# Patient Record
Sex: Female | Born: 1935 | Race: White | Hispanic: No | Smoking: Never smoker
Health system: Southern US, Community
[De-identification: ages and names within clinical notes are randomized; demographics above are authoritative.]

## PROBLEM LIST (undated history)

## (undated) DIAGNOSIS — R600 Localized edema: Secondary | ICD-10-CM

## (undated) DIAGNOSIS — C801 Malignant (primary) neoplasm, unspecified: Secondary | ICD-10-CM

## (undated) DIAGNOSIS — I480 Paroxysmal atrial fibrillation: Secondary | ICD-10-CM

## (undated) DIAGNOSIS — I10 Essential (primary) hypertension: Secondary | ICD-10-CM

## (undated) DIAGNOSIS — I5032 Chronic diastolic (congestive) heart failure: Secondary | ICD-10-CM

## (undated) DIAGNOSIS — I4891 Unspecified atrial fibrillation: Secondary | ICD-10-CM

## (undated) HISTORY — DX: Paroxysmal atrial fibrillation: I48.0

## (undated) HISTORY — PX: KNEE SURGERY: SHX244

## (undated) HISTORY — DX: Localized edema: R60.0

## (undated) HISTORY — DX: Unspecified atrial fibrillation: I48.91

## (undated) HISTORY — DX: Chronic diastolic (congestive) heart failure: I50.32

## (undated) HISTORY — PX: HIP SURGERY: SHX245

## (undated) HISTORY — PX: LUNG REMOVAL, PARTIAL: SHX233

---

## 1999-06-02 ENCOUNTER — Other Ambulatory Visit: Admission: RE | Admit: 1999-06-02 | Discharge: 1999-06-02 | Payer: Self-pay | Admitting: Internal Medicine

## 1999-06-02 ENCOUNTER — Encounter: Payer: Self-pay | Admitting: Internal Medicine

## 1999-06-02 ENCOUNTER — Encounter: Admission: RE | Admit: 1999-06-02 | Discharge: 1999-06-02 | Payer: Self-pay | Admitting: Internal Medicine

## 2000-06-21 ENCOUNTER — Encounter: Admission: RE | Admit: 2000-06-21 | Discharge: 2000-06-21 | Payer: Self-pay | Admitting: Internal Medicine

## 2000-06-21 ENCOUNTER — Encounter: Payer: Self-pay | Admitting: Internal Medicine

## 2000-07-07 ENCOUNTER — Encounter: Payer: Self-pay | Admitting: Internal Medicine

## 2000-07-07 ENCOUNTER — Encounter: Admission: RE | Admit: 2000-07-07 | Discharge: 2000-07-07 | Payer: Self-pay | Admitting: Internal Medicine

## 2001-07-12 ENCOUNTER — Encounter: Admission: RE | Admit: 2001-07-12 | Discharge: 2001-07-12 | Payer: Self-pay | Admitting: Internal Medicine

## 2001-07-12 ENCOUNTER — Encounter: Payer: Self-pay | Admitting: Internal Medicine

## 2002-08-04 ENCOUNTER — Encounter: Admission: RE | Admit: 2002-08-04 | Discharge: 2002-08-04 | Payer: Self-pay | Admitting: Internal Medicine

## 2002-08-04 ENCOUNTER — Other Ambulatory Visit: Admission: RE | Admit: 2002-08-04 | Discharge: 2002-08-04 | Payer: Self-pay | Admitting: Internal Medicine

## 2002-08-04 ENCOUNTER — Encounter: Payer: Self-pay | Admitting: Internal Medicine

## 2003-08-13 ENCOUNTER — Encounter: Admission: RE | Admit: 2003-08-13 | Discharge: 2003-08-13 | Payer: Self-pay | Admitting: Internal Medicine

## 2004-02-24 DIAGNOSIS — C3490 Malignant neoplasm of unspecified part of unspecified bronchus or lung: Secondary | ICD-10-CM

## 2004-02-24 HISTORY — DX: Malignant neoplasm of unspecified part of unspecified bronchus or lung: C34.90

## 2004-08-14 ENCOUNTER — Encounter: Admission: RE | Admit: 2004-08-14 | Discharge: 2004-08-14 | Payer: Self-pay | Admitting: Internal Medicine

## 2004-11-25 ENCOUNTER — Encounter: Admission: RE | Admit: 2004-11-25 | Discharge: 2004-11-25 | Payer: Self-pay | Admitting: Internal Medicine

## 2004-11-28 ENCOUNTER — Ambulatory Visit (HOSPITAL_COMMUNITY): Admission: RE | Admit: 2004-11-28 | Discharge: 2004-11-28 | Payer: Self-pay | Admitting: Thoracic Surgery

## 2004-12-11 ENCOUNTER — Encounter (INDEPENDENT_AMBULATORY_CARE_PROVIDER_SITE_OTHER): Payer: Self-pay | Admitting: Specialist

## 2004-12-11 ENCOUNTER — Inpatient Hospital Stay (HOSPITAL_COMMUNITY): Admission: RE | Admit: 2004-12-11 | Discharge: 2004-12-17 | Payer: Self-pay | Admitting: Thoracic Surgery

## 2004-12-15 ENCOUNTER — Ambulatory Visit: Payer: Self-pay | Admitting: Internal Medicine

## 2004-12-16 ENCOUNTER — Ambulatory Visit: Payer: Self-pay | Admitting: Internal Medicine

## 2004-12-22 ENCOUNTER — Inpatient Hospital Stay (HOSPITAL_COMMUNITY): Admission: EM | Admit: 2004-12-22 | Discharge: 2004-12-28 | Payer: Self-pay | Admitting: Emergency Medicine

## 2005-01-07 ENCOUNTER — Encounter: Admission: RE | Admit: 2005-01-07 | Discharge: 2005-01-07 | Payer: Self-pay | Admitting: Thoracic Surgery

## 2005-01-13 ENCOUNTER — Ambulatory Visit: Admission: RE | Admit: 2005-01-13 | Discharge: 2005-01-13 | Payer: Self-pay | Admitting: Gynecology

## 2005-03-18 ENCOUNTER — Ambulatory Visit (HOSPITAL_COMMUNITY): Admission: RE | Admit: 2005-03-18 | Discharge: 2005-03-18 | Payer: Self-pay | Admitting: Gynecology

## 2005-03-18 ENCOUNTER — Encounter: Admission: RE | Admit: 2005-03-18 | Discharge: 2005-03-18 | Payer: Self-pay | Admitting: Thoracic Surgery

## 2005-03-24 ENCOUNTER — Ambulatory Visit: Admission: RE | Admit: 2005-03-24 | Discharge: 2005-03-24 | Payer: Self-pay | Admitting: Gynecology

## 2005-03-31 ENCOUNTER — Ambulatory Visit (HOSPITAL_COMMUNITY): Admission: RE | Admit: 2005-03-31 | Discharge: 2005-03-31 | Payer: Self-pay | Admitting: Gynecology

## 2005-04-06 ENCOUNTER — Ambulatory Visit (HOSPITAL_COMMUNITY): Admission: RE | Admit: 2005-04-06 | Discharge: 2005-04-06 | Payer: Self-pay | Admitting: Internal Medicine

## 2005-04-08 ENCOUNTER — Ambulatory Visit: Payer: Self-pay | Admitting: Internal Medicine

## 2005-04-21 ENCOUNTER — Inpatient Hospital Stay (HOSPITAL_COMMUNITY): Admission: RE | Admit: 2005-04-21 | Discharge: 2005-04-26 | Payer: Self-pay | Admitting: Gynecology

## 2005-04-21 ENCOUNTER — Encounter (INDEPENDENT_AMBULATORY_CARE_PROVIDER_SITE_OTHER): Payer: Self-pay | Admitting: Specialist

## 2005-06-17 ENCOUNTER — Encounter: Admission: RE | Admit: 2005-06-17 | Discharge: 2005-06-17 | Payer: Self-pay | Admitting: Thoracic Surgery

## 2005-07-07 ENCOUNTER — Ambulatory Visit (HOSPITAL_COMMUNITY): Admission: RE | Admit: 2005-07-07 | Discharge: 2005-07-07 | Payer: Self-pay | Admitting: Internal Medicine

## 2005-07-07 ENCOUNTER — Ambulatory Visit: Payer: Self-pay | Admitting: Internal Medicine

## 2005-08-18 ENCOUNTER — Encounter: Admission: RE | Admit: 2005-08-18 | Discharge: 2005-08-18 | Payer: Self-pay | Admitting: Internal Medicine

## 2005-10-21 ENCOUNTER — Encounter: Admission: RE | Admit: 2005-10-21 | Discharge: 2005-10-21 | Payer: Self-pay | Admitting: Thoracic Surgery

## 2005-11-05 ENCOUNTER — Ambulatory Visit: Payer: Self-pay | Admitting: Internal Medicine

## 2005-11-09 LAB — CBC WITH DIFFERENTIAL/PLATELET
BASO%: 0.4 % (ref 0.0–2.0)
Basophils Absolute: 0 10*3/uL (ref 0.0–0.1)
Eosinophils Absolute: 0.2 10*3/uL (ref 0.0–0.5)
HCT: 37.8 % (ref 34.8–46.6)
HGB: 12.8 g/dL (ref 11.6–15.9)
MCHC: 33.9 g/dL (ref 32.0–36.0)
MONO#: 0.6 10*3/uL (ref 0.1–0.9)
NEUT#: 4.5 10*3/uL (ref 1.5–6.5)
NEUT%: 58.3 % (ref 39.6–76.8)
WBC: 7.8 10*3/uL (ref 3.9–10.0)
lymph#: 2.4 10*3/uL (ref 0.9–3.3)

## 2005-11-09 LAB — COMPREHENSIVE METABOLIC PANEL
ALT: 17 U/L (ref 0–40)
CO2: 25 mEq/L (ref 19–32)
Calcium: 9.4 mg/dL (ref 8.4–10.5)
Chloride: 105 mEq/L (ref 96–112)
Creatinine, Ser: 1.36 mg/dL — ABNORMAL HIGH (ref 0.40–1.20)

## 2005-11-10 ENCOUNTER — Ambulatory Visit (HOSPITAL_COMMUNITY): Admission: RE | Admit: 2005-11-10 | Discharge: 2005-11-10 | Payer: Self-pay | Admitting: Internal Medicine

## 2006-01-20 ENCOUNTER — Inpatient Hospital Stay (HOSPITAL_COMMUNITY): Admission: RE | Admit: 2006-01-20 | Discharge: 2006-01-23 | Payer: Self-pay | Admitting: Orthopedic Surgery

## 2006-04-21 ENCOUNTER — Ambulatory Visit: Payer: Self-pay | Admitting: Thoracic Surgery

## 2006-04-21 ENCOUNTER — Encounter: Admission: RE | Admit: 2006-04-21 | Discharge: 2006-04-21 | Payer: Self-pay | Admitting: Thoracic Surgery

## 2006-05-05 ENCOUNTER — Ambulatory Visit: Payer: Self-pay | Admitting: Internal Medicine

## 2006-05-10 LAB — CBC WITH DIFFERENTIAL/PLATELET
BASO%: 0.5 % (ref 0.0–2.0)
HCT: 36.5 % (ref 34.8–46.6)
MCHC: 33.5 g/dL (ref 32.0–36.0)
MONO#: 0.8 10*3/uL (ref 0.1–0.9)
NEUT%: 57.9 % (ref 39.6–76.8)
RBC: 4.41 10*6/uL (ref 3.70–5.32)
WBC: 8.5 10*3/uL (ref 3.9–10.0)
lymph#: 2.4 10*3/uL (ref 0.9–3.3)

## 2006-05-10 LAB — COMPREHENSIVE METABOLIC PANEL
ALT: 17 U/L (ref 0–35)
Albumin: 4.1 g/dL (ref 3.5–5.2)
CO2: 26 mEq/L (ref 19–32)
Calcium: 9.3 mg/dL (ref 8.4–10.5)
Chloride: 102 mEq/L (ref 96–112)
Sodium: 137 mEq/L (ref 135–145)
Total Protein: 7 g/dL (ref 6.0–8.3)

## 2006-05-11 ENCOUNTER — Ambulatory Visit (HOSPITAL_COMMUNITY): Admission: RE | Admit: 2006-05-11 | Discharge: 2006-05-11 | Payer: Self-pay | Admitting: Internal Medicine

## 2006-08-24 ENCOUNTER — Encounter: Admission: RE | Admit: 2006-08-24 | Discharge: 2006-08-24 | Payer: Self-pay | Admitting: Geriatric Medicine

## 2006-10-06 ENCOUNTER — Encounter: Admission: RE | Admit: 2006-10-06 | Discharge: 2006-10-06 | Payer: Self-pay | Admitting: Thoracic Surgery

## 2006-10-06 ENCOUNTER — Ambulatory Visit: Payer: Self-pay | Admitting: Thoracic Surgery

## 2006-11-04 ENCOUNTER — Ambulatory Visit: Payer: Self-pay | Admitting: Internal Medicine

## 2006-11-08 LAB — COMPREHENSIVE METABOLIC PANEL
ALT: 18 U/L (ref 0–35)
AST: 18 U/L (ref 0–37)
CO2: 25 mEq/L (ref 19–32)
Chloride: 105 mEq/L (ref 96–112)
Sodium: 140 mEq/L (ref 135–145)
Total Bilirubin: 0.5 mg/dL (ref 0.3–1.2)
Total Protein: 6.8 g/dL (ref 6.0–8.3)

## 2006-11-08 LAB — CBC WITH DIFFERENTIAL/PLATELET
BASO%: 0.5 % (ref 0.0–2.0)
MCHC: 34.4 g/dL (ref 32.0–36.0)
MONO#: 0.6 10*3/uL (ref 0.1–0.9)
RBC: 4.2 10*6/uL (ref 3.70–5.32)
WBC: 5.9 10*3/uL (ref 3.9–10.0)
lymph#: 2 10*3/uL (ref 0.9–3.3)

## 2006-11-09 ENCOUNTER — Ambulatory Visit (HOSPITAL_COMMUNITY): Admission: RE | Admit: 2006-11-09 | Discharge: 2006-11-09 | Payer: Self-pay | Admitting: Internal Medicine

## 2007-08-31 ENCOUNTER — Encounter: Admission: RE | Admit: 2007-08-31 | Discharge: 2007-08-31 | Payer: Self-pay | Admitting: Geriatric Medicine

## 2007-11-03 ENCOUNTER — Ambulatory Visit: Payer: Self-pay | Admitting: Internal Medicine

## 2007-11-07 LAB — CBC WITH DIFFERENTIAL/PLATELET
Basophils Absolute: 0 10*3/uL (ref 0.0–0.1)
HCT: 40.4 % (ref 34.8–46.6)
HGB: 13.6 g/dL (ref 11.6–15.9)
LYMPH%: 37.6 % (ref 14.0–48.0)
MCH: 30 pg (ref 26.0–34.0)
MONO#: 0.4 10*3/uL (ref 0.1–0.9)
NEUT%: 50.2 % (ref 39.6–76.8)
Platelets: 225 10*3/uL (ref 145–400)
WBC: 5.2 10*3/uL (ref 3.9–10.0)
lymph#: 2 10*3/uL (ref 0.9–3.3)

## 2007-11-07 LAB — COMPREHENSIVE METABOLIC PANEL
BUN: 26 mg/dL — ABNORMAL HIGH (ref 6–23)
CO2: 26 mEq/L (ref 19–32)
Calcium: 9.6 mg/dL (ref 8.4–10.5)
Chloride: 104 mEq/L (ref 96–112)
Creatinine, Ser: 1.08 mg/dL (ref 0.40–1.20)

## 2007-11-08 ENCOUNTER — Ambulatory Visit (HOSPITAL_COMMUNITY): Admission: RE | Admit: 2007-11-08 | Discharge: 2007-11-08 | Payer: Self-pay | Admitting: Internal Medicine

## 2008-10-15 ENCOUNTER — Encounter: Admission: RE | Admit: 2008-10-15 | Discharge: 2008-10-15 | Payer: Self-pay | Admitting: Geriatric Medicine

## 2008-11-01 ENCOUNTER — Ambulatory Visit: Payer: Self-pay | Admitting: Internal Medicine

## 2008-11-05 ENCOUNTER — Ambulatory Visit (HOSPITAL_COMMUNITY): Admission: RE | Admit: 2008-11-05 | Discharge: 2008-11-05 | Payer: Self-pay | Admitting: Internal Medicine

## 2008-11-05 LAB — COMPREHENSIVE METABOLIC PANEL
AST: 21 U/L (ref 0–37)
BUN: 26 mg/dL — ABNORMAL HIGH (ref 6–23)
Calcium: 9.2 mg/dL (ref 8.4–10.5)
Chloride: 104 mEq/L (ref 96–112)
Creatinine, Ser: 0.98 mg/dL (ref 0.40–1.20)
Total Bilirubin: 0.5 mg/dL (ref 0.3–1.2)

## 2008-11-05 LAB — CBC WITH DIFFERENTIAL/PLATELET
BASO%: 0.5 % (ref 0.0–2.0)
Basophils Absolute: 0 10*3/uL (ref 0.0–0.1)
EOS%: 2.4 % (ref 0.0–7.0)
HCT: 39.7 % (ref 34.8–46.6)
HGB: 13.4 g/dL (ref 11.6–15.9)
LYMPH%: 30 % (ref 14.0–49.7)
MCH: 30.8 pg (ref 25.1–34.0)
MCHC: 33.7 g/dL (ref 31.5–36.0)
MCV: 91.3 fL (ref 79.5–101.0)
NEUT%: 60.6 % (ref 38.4–76.8)
Platelets: 239 10*3/uL (ref 145–400)
lymph#: 1.9 10*3/uL (ref 0.9–3.3)

## 2009-06-26 ENCOUNTER — Inpatient Hospital Stay (HOSPITAL_COMMUNITY): Admission: RE | Admit: 2009-06-26 | Discharge: 2009-06-29 | Payer: Self-pay | Admitting: Orthopedic Surgery

## 2009-10-30 ENCOUNTER — Encounter: Admission: RE | Admit: 2009-10-30 | Discharge: 2009-10-30 | Payer: Self-pay | Admitting: Geriatric Medicine

## 2009-11-06 ENCOUNTER — Ambulatory Visit (HOSPITAL_BASED_OUTPATIENT_CLINIC_OR_DEPARTMENT_OTHER): Payer: 59 | Admitting: Internal Medicine

## 2009-11-07 ENCOUNTER — Ambulatory Visit (HOSPITAL_COMMUNITY)
Admission: RE | Admit: 2009-11-07 | Discharge: 2009-11-07 | Payer: Self-pay | Source: Home / Self Care | Admitting: Internal Medicine

## 2009-11-07 LAB — CBC WITH DIFFERENTIAL/PLATELET
BASO%: 0.7 % (ref 0.0–2.0)
EOS%: 3.3 % (ref 0.0–7.0)
Eosinophils Absolute: 0.2 10*3/uL (ref 0.0–0.5)
HGB: 12 g/dL (ref 11.6–15.9)
MCH: 26.3 pg (ref 25.1–34.0)
MCHC: 32.3 g/dL (ref 31.5–36.0)
NEUT%: 54.8 % (ref 38.4–76.8)
Platelets: 257 10*3/uL (ref 145–400)
RBC: 4.57 10*6/uL (ref 3.70–5.45)

## 2009-11-07 LAB — COMPREHENSIVE METABOLIC PANEL
ALT: 11 U/L (ref 0–35)
AST: 19 U/L (ref 0–37)
Alkaline Phosphatase: 65 U/L (ref 39–117)
BUN: 29 mg/dL — ABNORMAL HIGH (ref 6–23)
CO2: 27 mEq/L (ref 19–32)
Chloride: 103 mEq/L (ref 96–112)
Creatinine, Ser: 1.13 mg/dL (ref 0.40–1.20)
Sodium: 140 mEq/L (ref 135–145)
Total Bilirubin: 0.4 mg/dL (ref 0.3–1.2)

## 2010-03-15 ENCOUNTER — Other Ambulatory Visit: Payer: Self-pay | Admitting: Internal Medicine

## 2010-03-15 ENCOUNTER — Encounter: Payer: Self-pay | Admitting: Thoracic Surgery

## 2010-03-15 ENCOUNTER — Encounter: Payer: Self-pay | Admitting: Internal Medicine

## 2010-03-15 DIAGNOSIS — C3490 Malignant neoplasm of unspecified part of unspecified bronchus or lung: Secondary | ICD-10-CM

## 2010-03-16 ENCOUNTER — Encounter: Payer: Self-pay | Admitting: Orthopedic Surgery

## 2010-03-26 ENCOUNTER — Other Ambulatory Visit: Payer: Self-pay | Admitting: Family Medicine

## 2010-04-27 ENCOUNTER — Inpatient Hospital Stay (HOSPITAL_COMMUNITY)
Admission: EM | Admit: 2010-04-27 | Discharge: 2010-05-01 | DRG: 176 | Disposition: A | Discharge status: Home / Self Care | Payer: Medicare Other | Attending: Family Medicine | Admitting: Family Medicine

## 2010-04-27 ENCOUNTER — Emergency Department (HOSPITAL_COMMUNITY): Payer: Medicare Other

## 2010-04-27 DIAGNOSIS — I2699 Other pulmonary embolism without acute cor pulmonale: Principal | ICD-10-CM | POA: Diagnosis present

## 2010-04-27 DIAGNOSIS — G252 Other specified forms of tremor: Secondary | ICD-10-CM | POA: Diagnosis present

## 2010-04-27 DIAGNOSIS — I129 Hypertensive chronic kidney disease with stage 1 through stage 4 chronic kidney disease, or unspecified chronic kidney disease: Secondary | ICD-10-CM | POA: Diagnosis present

## 2010-04-27 DIAGNOSIS — G250 Essential tremor: Secondary | ICD-10-CM | POA: Diagnosis present

## 2010-04-27 DIAGNOSIS — K219 Gastro-esophageal reflux disease without esophagitis: Secondary | ICD-10-CM | POA: Diagnosis present

## 2010-04-27 DIAGNOSIS — R0902 Hypoxemia: Secondary | ICD-10-CM | POA: Diagnosis present

## 2010-04-27 DIAGNOSIS — Z96649 Presence of unspecified artificial hip joint: Secondary | ICD-10-CM

## 2010-04-27 DIAGNOSIS — M1990 Unspecified osteoarthritis, unspecified site: Secondary | ICD-10-CM | POA: Diagnosis present

## 2010-04-27 DIAGNOSIS — N181 Chronic kidney disease, stage 1: Secondary | ICD-10-CM | POA: Diagnosis present

## 2010-04-27 DIAGNOSIS — Z7982 Long term (current) use of aspirin: Secondary | ICD-10-CM

## 2010-04-27 DIAGNOSIS — Z85118 Personal history of other malignant neoplasm of bronchus and lung: Secondary | ICD-10-CM

## 2010-04-27 LAB — POCT CARDIAC MARKERS
CKMB, poc: <1 ng/mL — ABNORMAL LOW (ref 1.0–8.0)
Myoglobin, poc: 58.9 ng/mL (ref 12–200)

## 2010-04-27 LAB — BASIC METABOLIC PANEL
BUN: 19 mg/dL (ref 6–23)
CO2: 27 mEq/L (ref 19–32)
Calcium: 9.4 mg/dL (ref 8.4–10.5)
GFR calc Af Amer: >60 mL/min (ref >60)
Sodium: 138 mEq/L (ref 135–145)

## 2010-04-27 LAB — DIFFERENTIAL
Eosinophils Absolute: 0.2 10*3/uL (ref 0.0–0.7)
Eosinophils Relative: 3 % (ref 0–5)
Monocytes Absolute: 0.7 10*3/uL (ref 0.1–1.0)
Neutro Abs: 4.6 10*3/uL (ref 1.7–7.7)

## 2010-04-27 LAB — BRAIN NATRIURETIC PEPTIDE: Pro B Natriuretic peptide (BNP): 179 pg/mL — ABNORMAL HIGH (ref 0.0–100.0)

## 2010-04-27 LAB — CBC
Hemoglobin: 12.4 g/dL (ref 12.0–15.0)
MCHC: 32 g/dL (ref 30.0–36.0)
Platelets: 250 10*3/uL (ref 150–400)
RDW: 16.1 % — ABNORMAL HIGH (ref 11.5–15.5)
WBC: 8.2 10*3/uL (ref 4.0–10.5)

## 2010-04-27 LAB — PROTIME-INR: Prothrombin Time: 15.1 seconds (ref 11.6–15.2)

## 2010-04-27 MED ORDER — IOHEXOL 300 MG/ML  SOLN: 100.0000 mL | Freq: Once | INTRAMUSCULAR | Status: AC | PRN

## 2010-04-28 DIAGNOSIS — I2699 Other pulmonary embolism without acute cor pulmonale: Secondary | ICD-10-CM

## 2010-04-28 LAB — COMPREHENSIVE METABOLIC PANEL
ALT: 13 U/L (ref 0–35)
Alkaline Phosphatase: 57 U/L (ref 39–117)
BUN: 20 mg/dL (ref 6–23)
CO2: 26 mEq/L (ref 19–32)
Chloride: 106 mEq/L (ref 96–112)
GFR calc non Af Amer: 56 mL/min — ABNORMAL LOW (ref >60)
Glucose, Bld: 96 mg/dL (ref 70–99)
Potassium: 4 mEq/L (ref 3.5–5.1)
Sodium: 141 mEq/L (ref 135–145)
Total Bilirubin: 0.5 mg/dL (ref 0.3–1.2)
Total Protein: 6.1 g/dL (ref 6.0–8.3)

## 2010-04-28 LAB — CBC
HCT: 37.4 % (ref 36.0–46.0)
Hemoglobin: 11.9 g/dL — ABNORMAL LOW (ref 12.0–15.0)
MCV: 81.5 fL (ref 78.0–100.0)
WBC: 7.2 10*3/uL (ref 4.0–10.5)

## 2010-04-28 LAB — HEPARIN LEVEL (UNFRACTIONATED): Heparin Unfractionated: 0.69 IU/mL (ref 0.30–0.70)

## 2010-04-28 LAB — DIFFERENTIAL
Basophils Absolute: 0 10*3/uL (ref 0.0–0.1)
Lymphocytes Relative: 47 % — ABNORMAL HIGH (ref 12–46)
Lymphs Abs: 3.4 10*3/uL (ref 0.7–4.0)
Neutro Abs: 3 10*3/uL (ref 1.7–7.7)

## 2010-04-28 LAB — PROTIME-INR
INR: 1.1 (ref 0.00–1.49)
Prothrombin Time: 14.4 seconds (ref 11.6–15.2)

## 2010-04-29 LAB — HEPARIN LEVEL (UNFRACTIONATED): Heparin Unfractionated: 0.6 IU/mL (ref 0.30–0.70)

## 2010-04-29 LAB — DIFFERENTIAL
Basophils Relative: 1 % (ref 0–1)
Eosinophils Absolute: 0.3 10*3/uL (ref 0.0–0.7)
Eosinophils Relative: 5 % (ref 0–5)
Lymphs Abs: 2.6 10*3/uL (ref 0.7–4.0)

## 2010-04-29 LAB — BASIC METABOLIC PANEL
BUN: 17 mg/dL (ref 6–23)
CO2: 23 mEq/L (ref 19–32)
Calcium: 8.5 mg/dL (ref 8.4–10.5)
Chloride: 109 mEq/L (ref 96–112)
Creatinine, Ser: 0.94 mg/dL (ref 0.4–1.2)
GFR calc non Af Amer: 58 mL/min — ABNORMAL LOW (ref >60)

## 2010-04-29 LAB — CBC
MCV: 81.9 fL (ref 78.0–100.0)
Platelets: 203 10*3/uL (ref 150–400)
RDW: 16.5 % — ABNORMAL HIGH (ref 11.5–15.5)
WBC: 6.5 10*3/uL (ref 4.0–10.5)

## 2010-04-29 LAB — PROTIME-INR: INR: 1.08 (ref 0.00–1.49)

## 2010-04-30 LAB — BASIC METABOLIC PANEL
BUN: 14 mg/dL (ref 6–23)
CO2: 28 mEq/L (ref 19–32)
Chloride: 108 mEq/L (ref 96–112)
GFR calc non Af Amer: 53 mL/min — ABNORMAL LOW (ref >60)
Glucose, Bld: 95 mg/dL (ref 70–99)
Potassium: 4.3 mEq/L (ref 3.5–5.1)
Sodium: 141 mEq/L (ref 135–145)

## 2010-04-30 LAB — CBC
HCT: 35.8 % — ABNORMAL LOW (ref 36.0–46.0)
Hemoglobin: 11.1 g/dL — ABNORMAL LOW (ref 12.0–15.0)
MCV: 82.7 fL (ref 78.0–100.0)
RBC: 4.33 MIL/uL (ref 3.87–5.11)
RDW: 16.6 % — ABNORMAL HIGH (ref 11.5–15.5)
WBC: 5.6 10*3/uL (ref 4.0–10.5)

## 2010-04-30 LAB — DIFFERENTIAL
Basophils Absolute: 0 10*3/uL (ref 0.0–0.1)
Eosinophils Relative: 5 % (ref 0–5)
Lymphocytes Relative: 45 % (ref 12–46)
Lymphs Abs: 2.5 10*3/uL (ref 0.7–4.0)
Neutro Abs: 2.3 10*3/uL (ref 1.7–7.7)

## 2010-05-01 LAB — DIFFERENTIAL
Basophils Relative: 1 % (ref 0–1)
Lymphocytes Relative: 51 % — ABNORMAL HIGH (ref 12–46)
Lymphs Abs: 3 10*3/uL (ref 0.7–4.0)
Monocytes Absolute: 0.5 10*3/uL (ref 0.1–1.0)
Monocytes Relative: 9 % (ref 3–12)
Neutro Abs: 2 10*3/uL (ref 1.7–7.7)
Neutrophils Relative %: 34 % — ABNORMAL LOW (ref 43–77)

## 2010-05-01 LAB — CBC
HCT: 35.5 % — ABNORMAL LOW (ref 36.0–46.0)
Hemoglobin: 10.9 g/dL — ABNORMAL LOW (ref 12.0–15.0)
MCH: 25.2 pg — ABNORMAL LOW (ref 26.0–34.0)
MCHC: 30.7 g/dL (ref 30.0–36.0)
RBC: 4.32 MIL/uL (ref 3.87–5.11)

## 2010-05-01 LAB — PROTIME-INR
INR: 2.27 — ABNORMAL HIGH (ref 0.00–1.49)
Prothrombin Time: 25.2 seconds — ABNORMAL HIGH (ref 11.6–15.2)

## 2010-05-05 NOTE — H&P (Signed)
Alison Dunn, Alison Dunn                  ACCOUNT NO.:  [id number]  MEDICAL RECORD NO.:  [id number]           PATIENT TYPE:  E  LOCATION:  [facility]                         FACILITY:  [facility]  PHYSICIAN:  Isidor Holts, M.D.  DATE OF BIRTH:  09/27/1931  DATE OF ADMISSION:  04/27/2010 DATE OF DISCHARGE:                             HISTORY & PHYSICAL   PRIMARY CARE PHYSICIAN:  Hal T. Stoneking, MD  PRIMARY ORTHOPEDIC SURGEON:  Ollen Gross, MD  PRIMARY CARDIOTHORACIC SURGEON:  Dr. Edwyna Shell.  CHIEF COMPLAINT:  Shortness of breath on exertion for 2 weeks, worse in the last 1 week.  HISTORY OF PRESENT ILLNESS:  This is a 77 year old female, an excellent historian.  According to the patient, she became gradually short of breath on exertion over the last 2 weeks and this has become progressively worse in the last 1 week, with the patient finding it increasingly difficult to look after her husband, who requires a lot of assistance with his valvular heart problems and ambulating with a cane after a hip fracture a little over 1 year ago.  On April 24, 2010, she got really concerned and went to see her primary MD, who did a chest x- ray, which turned out to be negative.  He did, however, feel that she may have had some "fluid overload."  He stopped her hydrochlorothiazide and placed her on Lasix, 20 mg p.o. daily.  Although she did utilize this for the past 2 days, the shortness of breath got much worse, so much so today, that her granddaughter brought her to the emergency department.  The patient denies chest pain.  Denies cough or lower extremity swelling. Denies any history of long distance travel, although she does admit that she has done a lot of sitting down, lately.  PAST MEDICAL HISTORY.: 1. Right hip of osteoarthritis status post total hip replacement, Jun 26, 2009. 2. Status post left knee arthroplasty, November 2007. 3. History of benign tremor. 4. History of migraines. 5.  Intermittent vertigo. 6. Remote history of shingles. 7. Hypertension. 8. GERD/large hiatal hernia. 9. History of tubo-ovarian mass/sigmoid diverticulitis status post     salpingo-oophorectomy and sigmoid colectomy February 2007 with     primary anastomosis 10.History of stage I lung cancer status post VATS left upper     lobectomy October 2006. 11.Postoperative pulmonary embolism after lung surgery, was     anticoagulated for 6 months.  ALLERGIES: 1. PERCOCET cause hallucinations. 2. TYLOX cause hallucinations. 3. ROXICET cause hallucinations. 4. CODEINE, this causes nausea.  MEDICATION HISTORY: 1. Aciphex 20 mg p.o. daily. 2. Aspirin 81 mg p.o. daily. 3. Calcium carbonate OTC 1 tablet p.o. daily. 4. Furosemide 20 mg p.o. daily commenced on April 24, 2010 discontinued     on April 26, 2010. 5. Hydrochlorothiazide 25 mg p.o. daily discontinued on April 24, 2010. 6. Inderal LA 80 mg p.o. daily. 7. Metamucil 1 tablespoon p.o. daily. 8. Multivitamins therapeutic one p.o. daily. 9. Tylenol extra strength 500 mg p.o. q.i.d. p.r.n. 10.__________ 2 mg p.o. daily p.r.n. 11.Vitamin B12 OTC 1000 mcg sublingually daily. 12.Vitamin C 500  mg p.o. daily. 13.Vitamin D3 1000 units p.o. daily. 14.Vitamin E 400 units p.o. daily.  REVIEW OF SYSTEMS:  As per HPI and chief complaint.  The patient denies abdominal pain, vomiting, or diarrhea.  She moves her bowels regularly with the use of Metamucil.  Denies any lower extremity swelling, tenseness, or pain.  Denies fever or chills.  Rest of systems review is negative.  SOCIAL HISTORY:  The patient is married for about 57 years now.  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  Nonsmoker, nondrinker.  Has no history of drug abuse. She has 2 offspring, a boy and a girl.  FAMILY HISTORY:  The patient's mother died of an MI at age 55 years. She was status post CVA.  The patient's father died status post  MI.  The patient is not sure as to how old he was, at the time.  PHYSICAL EXAMINATION:  VITAL SIGNS:  Temperature 98.7, pulse 90 per minute regular, respiratory rate 20, BP 157/100 mmHg.  Rechecked 144/85 mmHg, pulse oximeter 100% and 2 L of oxygen.  The patient was obviously short of breath on minimal exertion during the course of this evaluation although she was able to talk in complete sentences, alert, communicative. HEENT:  No clinical pallor or jaundice.  No conjunctival injection. Throat: Visible mucous membranes appear dry.  Neck: Supple JVP not seen. No palpable lymphadenopathy.  No palpable goiter. CHEST:  Clinically clear to auscultation.  No wheezes, no crackles. HEART:  Sounds 1 and 2 heard normal, occasionally ectopics, no murmurs. ABDOMEN:  Morbidly obese, soft, nontender.  Lower midline laparotomy scar is noted.  Unable to palpate organs.  Bowel sounds appear normal. EXTREMITIES:  Lower extremity examination, no pitting edema.  The patient's  appears somewhat prominent however and not a "tense" certainly nontender.  Superficial veins appear prominent.  Peripheral pulses are palpable. MUSCULOSKELETAL:  The patient has generalized osteoarthritic changes. CENTRAL NERVOUS SYSTEM:  The patient has an intention tremor was well as titubation.  Otherwise, no focal neurologic deficits on gross examination.  INVESTIGATIONS:  CBC; WBC 8.2, hemoglobin 12.4, hematocrit 38.8, platelets 250.  Electrolytes, sodium 138, potassium 4.3, chloride 103, CO2 of 27, BUN 19, creatinine 0.96, glucose 101.  BNP 179, calcium 9.4. Troponin I point-of-care less than 0.05.  A 12-lead EKG done April 27, 2010 showed sinus rhythm regular, occasional ventricular ectopics, normal axis, partial right bundle-branch block pattern, no acute ischemic changes.  This does not significantly differ from 12-lead EKG of June 21, 2009, although the previous EKG had no partial right bundle- branch block pattern.   Chest x-ray April 27, 2010 showed no acute findings.  Chest CT angiogram April 27, 2010 showed right upper lobe, right middle lobe, and right lower lobe acute pulmonary emboli.  ASSESSMENT AND PLAN: 1. Multiple pulmonary emboli.  We shall admit the patient for     telemetric monitoring, institute anticoagulation with intravenous     heparin infusion and concomitant oral Coumadin.  Arrange bilateral     lower extremity venous Dopplers, to rule out deep vein thrombosis.     The patient will likely need lifelong anticoagulation.  2. Gastroesophageal reflux disease.  We shall manage proton pump     inhibitor.  The patient has a documented large hiatal hernia on chest     CT angiogram.  3. Hypertension.  The patient's blood pressure was mildly elevated at     this time.  We shall continue preadmission  beta-blocker, monitor     and adjust medications, if indicated.    Further management will depend on clinical course.     Isidor Holts, M.D.     CO/MEDQ  D:  04/27/2010  T:  04/27/2010  Job:  [id number]  cc:   Hal T. Stoneking, M.D.  Electronically Signed by Isidor Holts M.D. on 05/05/2010 11:21:13 AM

## 2010-05-08 LAB — POCT I-STAT, CHEM 8
BUN: 29 mg/dL — ABNORMAL HIGH (ref 6–23)
Chloride: 106 mEq/L (ref 96–112)
Glucose, Bld: 107 mg/dL — ABNORMAL HIGH (ref 70–99)
HCT: 41 % (ref 36.0–46.0)
Potassium: 4.3 mEq/L (ref 3.5–5.1)

## 2010-05-08 NOTE — Discharge Summary (Signed)
NAMEMINDI, AKERSON                  ACCOUNT NO.:  [id number]  MEDICAL RECORD NO.:  [id number]           PATIENT TYPE:  I  LOCATION:  3712                         FACILITY:  [facility]  PHYSICIAN:  Pleas Koch, MD        DATE OF BIRTH:  [birth year]  DATE OF ADMISSION:  04/27/2010 DATE OF DISCHARGE:  05/01/2010                              DISCHARGE SUMMARY   PRIMARY CARE PHYSICIAN:  Hal T. Stoneking, MD  ORTHOPEDIC SURGEON:  Ollen Gross, MD  CARDIOTHORACIC SURGEON:  Ines Bloomer, MD  DISCHARGE DIAGNOSIS: 1. Bilateral pulmonary embolism. 2. Hypertension. 3. Gastroesophageal reflux. 4. 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 5. Osteoarthritis. 6. Hypoxemia. 7. Benign tremor.  DISCHARGE MEDICATIONS: 1. Tylenol 500 p.o. q.i.d. p.r.n. 2. Warfarin. 3. Valium 2 mg 1 tablet daily p.r.n. 4. Inderal LA 80 mg 1 tab daily. 5. Metamucil 1 tablespoon daily. 6. Lasix 20 mg daily. 7. Multivitamins 1 tablet daily. 8. Aspirin 81 mg daily. 9. Aciphex 20 mg 1 tablet daily. 10.Calcium carbonate 1 tablet daily. 11.Vitamin B12 over the counter 1 tablet daily. 12.Vitamin C 500 mg 1 daily. 13.Vitamin D3 1000 units one daily. 14.Vitamin E 400 units p.o. daily.  NEW MEDICATIONS: 1. Hydrochlorothiazide 12.5 daily. 2. Amlodipine 5 mg daily. 3. Coumadin 7.5 mg daily.  RECOMMENDATIONS:  The patient needs to have the need for aspirin reassessed, as the patient is now on Coumadin, also calcium carbonate usually at t.i.d. dose.  PERTINENT RADIOLOGICAL FINDINGS: 1. Chest x-ray portable March 29, 2010, showed no acute findings.     CT angiogram of chest March 29, 2010, showed positive studies for     bilateral pulmonary emboli.  No right heart strain. 2. Left upper lobectomy. 3. Large hiatal hernia. 4. EKG done March 29, 2010, showed multiform PVCs, left atrial     abnormality.  Unchanged from prior.  BRIEF HOSPITAL COURSE:  The patient is a pleasant 77 year old female, who became  gradually short of breath on the day prior to admission over the prior 2 weeks.  This has been worsening and the patient is finding it difficult to look after her husband, who requires lot of assistance with valvular heart problems and ambulating after a hip fracture a year ago.  She went to her primary MD, who did chest x-ray on April 24, 2010, and he felt that she had some fluid overload.  He stopped fluid hydrochlorothiazide and placed her on Lasix.  She states the shortness breath despite that got so much worse that she went to the emergency room.  PHYSICAL EXAMINATION:  VITAL SIGNS:  On admission, temperature 98.7, pulse 90, and blood pressure 157/100. LUNGS:  The patient was relatively short of breath on minimal exertion during evaluation, although was able to talk complete sentences. HEART:  Occasional ectopics.  JVP not seen.  No pitting edema.  PERTINENT ADMISSION LABORATORY DATA:  WBC 8.2, hemoglobin 12.4, hematocrit 38.8, and platelets 250.  Electrolytes; sodium 138, potassium 4.3, chloride 103, CO2 of 27, BUN 19, creatinine 0.96, and glucose 101. BNP 179.  EKG was as above.  HOSPITAL COURSE: 1. Bilateral  PEs.  The patient was initially placed on heparin and     this was bridged over to Lovenox on April 30, 2010.  She was placed     on Coumadin per pharmacy protocol; however, I increased this to 10     mg every day.  She became therapeutic on May 01, 2010, and hence     further bridging was not necessary.  I have discharged her home on     7.5 mg of Coumadin, and she states she will follow up with Dr.     Pete Glatter and make an appointment to see him on Monday for repeat     INR.  The patient is conversant with the use of Coumadin and in     fact her husband is on the same medication.  She has been counseled     extensively regarding low vitamin K diet. 2. CKD I to II.  Repeat BMP will be needed at Dr. Laverle Hobby office,     as her creatinine did bump to 1.06 and we had to  lower her dose of     hydrochlorothiazide 25/12.5. 3. Hypertension.  She was placed on propranolol as home dose and kept     on her hydrochlorothiazide 12.5 and amlodipine 5 mg daily on     discharge.  She will likely need to be on this. 4. Osteoarthritis is stable. 5. Hypoxemia.  The patient had relatively low sats initially in the     hospital and this was attributable to her probably having pulmonary     emboli.  She was ambulated subsequently in the halls and her O2     sats were 97%.  As such, she does not meet criteria for needing     oxygen at this time.  It is notable that she has had an upper lobe     lobectomy in 2006 for stage I lung cancer and this may be a     complicating issue.  CONDITION ON DISCHARGE:  The patient was discharged home in stable state.  DISCHARGE PHYSICAL EXAMINATION:  VITAL SIGNS:  On the day of discharge, 97.9, pulse rate 70, respirations 18, and blood pressure 136-145/84-85.  DISCHARGE LABORATORY DATA:  Labs showed an INR of 2.27 and her hemoglobin and hematocrit was 10.9/35.5.  This may warrant an outpatient evaluation for anemia.  I spent over 30 minutes in time coordinating this patient's discharge.          ______________________________ Pleas Koch, MD     JS/MEDQ  D:  05/01/2010  T:  05/01/2010  Job:  [id number]  cc:   Hal T. Stoneking, M.D. Ollen Gross, M.D. Ines Bloomer, M.D.  Electronically Signed by Pleas Koch MD on 05/08/2010 04:41:38 PM

## 2010-05-13 LAB — BASIC METABOLIC PANEL
CO2: 30 mEq/L (ref 19–32)
Calcium: 8.5 mg/dL (ref 8.4–10.5)
Chloride: 102 mEq/L (ref 96–112)
Creatinine, Ser: 0.94 mg/dL (ref 0.4–1.2)
GFR calc Af Amer: >60 mL/min (ref >60)
Glucose, Bld: 119 mg/dL — ABNORMAL HIGH (ref 70–99)
Potassium: 4.5 mEq/L (ref 3.5–5.1)
Sodium: 136 mEq/L (ref 135–145)
Sodium: 137 mEq/L (ref 135–145)

## 2010-05-13 LAB — URINALYSIS, ROUTINE W REFLEX MICROSCOPIC
Bilirubin Urine: NEGATIVE
Ketones, ur: NEGATIVE mg/dL
Nitrite: NEGATIVE
Protein, ur: NEGATIVE mg/dL
Urobilinogen, UA: 0.2 mg/dL (ref 0.0–1.0)
pH: 6.5 (ref 5.0–8.0)

## 2010-05-13 LAB — COMPREHENSIVE METABOLIC PANEL
AST: 27 U/L (ref 0–37)
BUN: 23 mg/dL (ref 6–23)
CO2: 31 mEq/L (ref 19–32)
Calcium: 8.7 mg/dL (ref 8.4–10.5)
Chloride: 105 mEq/L (ref 96–112)
Creatinine, Ser: 0.95 mg/dL (ref 0.4–1.2)
GFR calc Af Amer: >60 mL/min (ref >60)
GFR calc non Af Amer: 57 mL/min — ABNORMAL LOW (ref >60)
Total Bilirubin: 0.6 mg/dL (ref 0.3–1.2)

## 2010-05-13 LAB — CBC
HCT: 32 % — ABNORMAL LOW (ref 36.0–46.0)
HCT: 32.8 % — ABNORMAL LOW (ref 36.0–46.0)
HCT: 33.9 % — ABNORMAL LOW (ref 36.0–46.0)
HCT: 40.4 % (ref 36.0–46.0)
Hemoglobin: 10.7 g/dL — ABNORMAL LOW (ref 12.0–15.0)
Hemoglobin: 10.9 g/dL — ABNORMAL LOW (ref 12.0–15.0)
Hemoglobin: 11.2 g/dL — ABNORMAL LOW (ref 12.0–15.0)
MCHC: 33.3 g/dL (ref 30.0–36.0)
MCHC: 32.8 g/dL (ref 30.0–36.0)
MCV: 90.5 fL (ref 78.0–100.0)
MCV: 90.8 fL (ref 78.0–100.0)
MCV: 91.4 fL (ref 78.0–100.0)
Platelets: 199 10*3/uL (ref 150–400)
Platelets: 231 10*3/uL (ref 150–400)
RBC: 3.52 MIL/uL — ABNORMAL LOW (ref 3.87–5.11)
RBC: 3.73 MIL/uL — ABNORMAL LOW (ref 3.87–5.11)
RBC: 4.42 MIL/uL (ref 3.87–5.11)
RDW: 14.6 % (ref 11.5–15.5)
RDW: 15 % (ref 11.5–15.5)
WBC: 11.2 10*3/uL — ABNORMAL HIGH (ref 4.0–10.5)

## 2010-05-13 LAB — APTT: aPTT: 29 seconds (ref 24–37)

## 2010-05-13 LAB — PROTIME-INR
INR: 0.97 (ref 0.00–1.49)
Prothrombin Time: 12.8 seconds (ref 11.6–15.2)

## 2010-05-13 LAB — TYPE AND SCREEN: ABO/RH(D): O POS

## 2010-05-13 LAB — URINE MICROSCOPIC-ADD ON

## 2010-07-08 NOTE — Letter (Signed)
October 06, 2006   Lajuana Matte, MD  [phone]  [place], [place] [address]   Re:  Alison Dunn, VOLLMER                  DOB:  03/24/1931   Dear Arbutus Ped:   I saw Alison Dunn back today.  She is scheduled for a CT scan in 1 month.  Her chest x-ray was stable.  Her incisions were well healed.  It has  been approximately 2 years since we did her surgery.  I will refer her  to you for longterm followup.  If there is any question I will be happy  to see her also, but she is doing well from our standpoint.  Her blood  pressure was 150/80, pulse was 65, respirations 18, saturations were  96%.  Lungs were clear to auscultation and percussion.   I appreciate the opportunity of taking care of Alison Dunn.   Ines Bloomer, M.D.  Electronically Signed   DPB/MEDQ  D:  10/06/2006  T:  10/07/2006  Job:  [id number]   cc:   Hal T. Stoneking, M.D.

## 2010-07-11 NOTE — Op Note (Signed)
NAMEVALARIA, Alison Dunn                  ACCOUNT NO.:  [id number]   MEDICAL RECORD NO.:  [id number]          PATIENT TYPE:  INP   LOCATION:  1008                         FACILITY:  [facility]   PHYSICIAN:  Lebron Conners, M.D.   DATE OF BIRTH:  [birth year]   DATE OF PROCEDURE:  04/21/2005  DATE OF DISCHARGE:                                 OPERATIVE REPORT   PREOPERATIVE DIAGNOSIS:  Possible metastatic lung cancer.   POSTOPERATIVE DIAGNOSIS:  Diverticulitis of the sigmoid colon with  perforation and abscess.   PROCEDURE:  Sigmoid colectomy with anastomosis.   SURGEON:  Dr. Lebron Conners.   ASSISTANT:  Dr. Stanford Breed.   ANESTHESIA:  General.   BLOOD LOSS:  Minimal for my portion of the procedure.   COMPLICATIONS:  None.   CLINICAL NOTE/CONSULTATION:  Dr. Stanford Breed asked me to come in to  assist with evaluation and treatment of Ms. Baxley upon whom he was operating  because of a mass in the left pelvis which was positive on PET scan. The  patient has early favorable lung cancer but that was there was concerned  that she either had a primary ovarian neoplasm or a metastasis of lung  cancer to the pelvis. The patient has no history of diverticulitis. She has  had normal white count, no fever, no GI symptoms. Upon exploration, Dr.  Stanford Breed found that there was a complex area of induration mass  involving the left adnexa and the sigmoid colon. As he dissected the adnexa  away, he came into an abscess cavity. He proceeded with bilateral salpingo-  oophorectomy and he asked me to come in and evaluate. The patient's  preoperative white count was 8100. She had a bowel prep which she had  tolerated well.   PROCEDURE:  I scrubbed in and assessed the colon. There was an area where an  abscess had been opened. There was no peritonitis evident. There was a good  deal of induration of the mesentery of the distal sigmoid colon. Because of  the chronicity of the process and lack of  peritonitis and acute illness and  the fact that a good bowel prep had been done, I felt that a colectomy with  primary anastomosis was advisable. The patient's family agreed to have what  ever procedure was warranted done and the operative personnel spoke with  them about the performance of the procedure and they agreed to go ahead.  Dr. Stanford Breed had mobilized the sigmoid laterally and exposed the left  ureter. He also dissected along the presacral space a bit. I looked at the  bowel in the mid sigmoid and it looked healthy. There were some diverticula  present throughout the whole colon. Distally there was nice soft bowel. I  divided the bowel proximally with cutting stapler and then segmentally  divided the mesentery toward the sacral promontory clamping and ligating  vessels with 2-0 silks. I then took the mesenteric division back up to the  bowel beyond the abscess and the indurated mass and when I had thinned the  mesentery down adequately placed stay sutures on each side  of the bowel and  resected it leaving the distal part open. I pulled down the proximal bowel  and it came down and remained down in the pelvis without any tension  whatsoever. I checked for hemostasis and then performed an end-to-end  anastomosis in a single layer utilizing 3-0 silk sutures. When I was  satisfied with the integrity of the anastomosis, Dr. Tamela Oddi went down  and put in a proctoscope and distended the bowel under water and there was  no leakage of air. I felt that the vascularity looked good. I saw no  diverticula immediately adjacent to the anastomosis. I then irrigated the  area briefly and removed the irrigant. I applied Tisseel to the anastomotic  surface and I was very well satisfied with my anastomosis. I closed the  mesenteric defect with interrupted silk sutures. The sponge, needle and  instrument counts were correct. I placed a 19-French Blake drain in the cul-  de-sac and along  the left gutter where the abscess had been. I brought that  out through a laparoscopic incision in the left lower quadrant and sutured  it to the skin with 2-0 silk. I pulled the omentum down into the pelvis and  along the anterior abdomen over the small bowel. I inspected the cecum and  appendix and the cecum was mobile but otherwise they appeared normal. I  noted some diverticula in the transverse colon but no inflammation. I closed  the fascia with running #1 PDS and then irrigated the subcutaneous tissues  and closed the skin with staples. The patient tolerated the operation well  and went to PACU in stable condition.      Lebron Conners, M.D.  Electronically Signed     WB/MEDQ  D:  04/21/2005  T:  04/22/2005  Job:  [id number]   cc:   De Blanch, M.D.  [address]  [place]  [place] [address]   Roseanna Rainbow, M.D.  Fax: [phone]

## 2010-07-11 NOTE — H&P (Signed)
[name], [name]                  ACCOUNT NO.:  [id number]   MEDICAL RECORD NO.:  [id number]          PATIENT TYPE:  INP   LOCATION:  NA                           FACILITY:  [facility]   PHYSICIAN:  Ollen Gross, M.D.    DATE OF BIRTH:  [birth year]   DATE OF ADMISSION:  01/20/2006  DATE OF DISCHARGE:                              HISTORY & PHYSICAL   DATE OF OFFICE VISIT, HISTORY AND PHYSICAL:  December 31, 2005.   CHIEF COMPLAINT:  Left knee pain.   HISTORY OF PRESENT ILLNESS:  The patient is a 77 year old female who is  well-known to Dr. Ollen Gross, having been seen in the past for  significant end-stage arthritis of the left knee that has been  progressive in nature.  She has reached a point where she eventually was  recommended to undergo knee replacement last year about the same time  last fall.  In her preop workup she was found to have a spot on her lung  and was diagnosed with lung cancer.  She has subsequently undergone a  lung resection for her lung cancer per Dr. Edwyna Shell and also been under  treatment by Dr. Arbutus Ped from an oncology standpoint.  She has recovered  from that and continues to have significant pain.  It is felt she could  still benefit from undergoing knee replacement.  Risks and benefits have  been discussed and she has elected to proceed with surgery.  She has  been seen by Dr. Olena Leatherwood preoperatively and felt she is at moderate risk  for this surgery.  Again, risks and benefits have been discussed and she  has elected to proceed with surgery.   ALLERGIES:  No known drug allergies.   CURRENT MEDICATIONS:  Vitamin E, vitamin C, Centrum Silver, aspirin,  Celebrex, AcipHex, Prinzide, Inderal LA and Metamucil.   PAST MEDICAL HISTORY:  1. Hypertension.  2. Reflux disease.  3. Diverticulosis.  4. History of cystitis.  5. Lung cancer, status post resection.  6. Also significant for a pulmonary embolism following her lung      surgery.   PAST SURGICAL  HISTORY:  1. Lung resection secondary to cancer in October 2006.  2. Stomach surgery, which sounds like a colectomy for diverticulosis,      February 2007.  3. She has also undergone a partial hysterectomy in the past.   SOCIAL HISTORY:  Married.  Retired.  Nonsmoker.  No alcohol.  Two  children.   FAMILY HISTORY:  Mother with history of heart disease, hypertension and  stroke.  Father with a history of heart disease.   REVIEW OF SYSTEMS:  GENERAL:  No fevers, chills, night sweats.  NEUROLOGIC:  No seizure, syncope or paralysis.  RESPIRATORY:  She does  get shortness of breath on exertion but no shortness of breath at rest.  No productive cough or hemoptysis.  CARDIOVASCULAR:  No chest pain,  angina, orthopnea.  GI:  No nausea, vomiting, diarrhea or constipation.  She does have some history of diverticulosis and reflux disease.  GU:  History of cystitis.  No history of  dysuria or hematuria.  MUSCULOSKELETAL:  Left knee.   PHYSICAL EXAMINATION:  VITAL SIGNS:  Pulse 76, respirations 14, blood  pressure 134/84.  GENERAL:  A 77 year old white female, well-nourished, well-developed, in  no acute distress.  She is alert and oriented and cooperative.  HEENT:  Normocephalic, atraumatic.  Pupils equal, round, and reactive to  light.  Oropharynx clear.  EOMs intact.  CHEST:  Clear anterior, posterior chest walls.  No rhonchi, rales or  wheezing.  CARDIAC:  Regular rate and rhythm, no murmur.  ABDOMEN:  Soft, nontender, round, bowel sounds present.  RECTAL, BREASTS, GENITALIA:  Not done, not pertinent to present illness.  EXTREMITIES:  Left knee:  The left knee shows a varus malalignment  deformity, marked crepitus noted.  Range of motion of 5-110.   IMPRESSION:  1. Osteoarthritis, left knee.  2. Hypertension.  3. Reflux disease.  4. History of diverticulosis.  5. History of cystitis.  6. Lung cancer, status post resection.  7. History of pulmonary embolism following lung surgery.    PLAN:  The patient will be admitted to [facility] to undergo a  left total knee replacement arthroplasty.  Surgery will be performed by  Ollen Gross, M.D.  Her medical physician, Dr. Olena Leatherwood with [facility] at [facility], if needed any type of  medical assistance throughout the hospital course we will contact the  [facility].      Alexzandrew L. Julien Girt, P.A.      Ollen Gross, M.D.  Electronically Signed    ALP/MEDQ  D:  01/19/2006  T:  01/20/2006  Job:  [id number]   cc:   Ladell Pier, M.D.  Fax: [phone]   Lajuana Matte, MD  Fax: [phone]   Ines Bloomer, M.D.  [address]  [place]  [place] [address]

## 2010-07-11 NOTE — H&P (Signed)
NAMETIONDRA, FANG                  ACCOUNT NO.:  [id number]   MEDICAL RECORD NO.:  [id number]          PATIENT TYPE:  INP   LOCATION:  3731                         FACILITY:  [facility]   PHYSICIAN:  Colleen Can. Deborah Chalk, M.D.DATE OF BIRTH:  October 10, 1931   DATE OF ADMISSION:  12/22/2004  DATE OF DISCHARGE:                                HISTORY & PHYSICAL   HISTORY:  Alison Dunn is a 77 year old female admitted because of shortness  of breath. She had left upper lobe cancer noted during a preoperative  evaluation for knee replacement. Her FEV1 preoperatively was 62% of  predicted. She has been a nonsmoker previously. She was admitted on December 11, 2004, and subsequently discharged on December 17, 2004. She basically did  well initial. Her hospital course was reasonably unremarkable and the VATS  procedure revealed a left upper lobe adenocarcinoma with following pathology  revealing stage Ib non-small-cell cancer. She presented to ER on December 22, 2004, because of shortness of breath. She was noted have positive troponins  of 0.16. She awoke this morning with some shortness of breath but no chest  pain. This afternoon she had another episode of shortness of breath, all  with exertion. There was a total of three episodes and it took about 5  minutes for recovery. Her O2 saturations were 80% when she was seen by EMS  and oxygen and she was referred to the emergency room.   PAST MEDICAL HISTORY:  Remarkable for:  1.  Hypertension.  2.  Gastroesophageal reflux.  3.  Benign tremor.  4.  Cystoscopies.   DISCHARGE MEDICINES FROM THE HOSPITAL:  1.  Aspirin.  2.  Inderal LA 80 mg per day.  3.  Celebrex 200 mg a day.  4.  Prinizide 20/25 mg per day.  5.  AcipHex 20 mg a day.  6.  Tylox.   ALLERGIES:  DARVOCET causes hallucinations.   SOCIAL HISTORY:  She worked in a Clinical cyt[employer]ist, is a nonsmoker. She is  married, two children, no alcohol.   FAMILY HISTORY:  Father died of an MI at 21.  Mother died at 24 of a CVA and  MI.   REVIEW OF SYSTEMS:  She is at 175, height is 5 feet 5 inches. She has a  history of reflux, COPD, osteoarthritis of the knee, and otherwise is  essentially negative. She is describes as somewhat vigorous before her  operation.   PHYSICAL EXAMINATION:  VITAL SIGNS:  Her blood pressure is 111/66, heart  rate is 95, respiratory rate is 24.  GENERAL:  She is a pleasant, somewhat dyspneic white female. She is  comfortable at rest with oxygen in place.  HEENT:  Negative. No jugular venous distention.  LUNGS:  Reasonably clear. Left chest wounds are healing. Some decreased  breath sounds on the left.  HEART:  Shows no murmur and no gallop.  ABDOMEN:  Soft, nontender, without organomegaly.  EXTREMITIES:  Without edema.   Chest x-ray shows small bilateral effusions, normal heart size. Lungs are  clear, no congestive heart failure.   EKG shows normal  sinus, left atrial abnormality, no acute change.   Labs that were done show D-dimer of 12.07. Creatinine of 1.1, BUN of 24,  glucose of 109. CK-MBs were all negative, myoglobin was negative. Troponin  I's were 0.16, 0.17, and subsequently 0.12.   OVERALL IMPRESSION:  1.  Dyspnea.  2.  Status post left lobectomy.  3.  Hypertension.  4.  Elevated D-dimer and decreased oxygen saturation with questionable      positive troponins.   PLAN:  Will admit, rule out myocardial infarction. Will use oxygen. Spiral  CT scan, heparin. Will consider catheterization if CT scan of the chest is  negative.      Colleen Can. Deborah Chalk, M.D.  Electronically Signed     SNT/MEDQ  D:  12/22/2004  T:  12/23/2004  Job:  [id number]   cc:   Ines Bloomer, M.D.  [address]  [place]  [place] [address]

## 2010-07-11 NOTE — Op Note (Signed)
Alison Dunn, Alison Dunn                  ACCOUNT NO.:  [id number]   MEDICAL RECORD NO.:  [id number]          PATIENT TYPE:  INP   LOCATION:  1507                         FACILITY:  [facility]   PHYSICIAN:  Ollen Gross, M.D.    DATE OF BIRTH:  1931-03-14   DATE OF PROCEDURE:  01/20/2006  DATE OF DISCHARGE:                               OPERATIVE REPORT   PREOPERATIVE DIAGNOSIS:  Osteoarthritis left knee.   POSTOPERATIVE DIAGNOSIS:  Osteoarthritis left knee.   PROCEDURE:  Left total knee arthroplasty.   SURGEON:  Dr. Lequita Halt   ASSISTANT:  Avel Peace, PA-C   ANESTHESIA:  General with postop Marcaine pain pump.   ESTIMATED BLOOD LOSS:  Minimal.   DRAINS:  Hemovac x1.   TOURNIQUET TIME:  39 minutes at 300 mmHg.   COMPLICATIONS:  None.   CONDITION:  Stable.   CLINICAL NOTE:  Alison Dunn is a 77 year old female with end-stage  osteoarthritis of the left knee and intractable pain.  She has failed  nonoperative management including multiple series of injections and  presents now for total knee arthroplasty.   PROCEDURE IN DETAIL:  After successful administration of general  anesthetic a tourniquet was placed high on the left thigh and left lower  extremity prepped and draped in usual sterile fashion.  Extremity was  wrapped in Esmarch, knee flexed, tourniquet inflated 300 mmHg.  Midline  incision made with 10 blade through subcutaneous tissue to the level of  the extensor mechanism.  Fresh blade is used to make a medial  parapatellar arthrotomy.  Soft tissue of the proximal medial tibia  subperiosteally elevated to the joint line with the knife and into the  semimembranosus bursa with a Cobb elevator.  Soft tissue laterally is  elevated with attention being paid to avoid patellar tendon on tibial  tubercle.  Patella subluxed laterally, knee flexed 90 degrees, ACL and  PCL removed.  Drill was used to create a starting hole in the distal  femur, canal was thoroughly irrigated.  5  degrees right valgus alignment  guide is placed referencing off the posterior condyles, rotations marked  and the block pinned to remove 10 mm off the distal femur.  Distal  femoral resection is made with an oscillating saw.  Size 2.5 is most  appropriate femoral component, rotations marked off the epicondylar  axis.  Size 2.5 cutting block is placed and the anterior, posterior and  chamfer cuts were made.   Tibia subluxed forward and the menisci are removed.  Extramedullary  tibial alignment guide is placed referencing proximally at the medial  aspect of the tibial tubercle and distally along the second metatarsal  axis and tibial crest.  The block is pinned to remove approximately 10  mm off the distal femur.  To get to the bottom of the defect we had to  take 2 additional mm.  The size 2.5 was the most appropriate tibial  component and the proximal tibia was prepared with the modular drill and  keel punch for a 2.5.  Femoral preparation is completed with the  intercondylar cut.  Size 2.5 mobile bearing tibial trial with a 2.5 posterior stabilized  femoral trial and 12.5 mm posterior stabilized rotating platform insert  trial are placed.  With the 12.5 she hyperextended a tiny bit so I went  to 15.  This allowed for full extension with excellent varus and valgus  balance throughout full range of motion.  The patella was everted and  thickness measured to be 20 mm.  Freehand resection taken to 12 mm, the  35 template was placed, lug holes were drilled, trial patella was placed  and it tracks normally.  Osteophytes removed off the posterior femur  with the trial in place. All trials removed and the cut bone surfaces  prepared with pulsatile lavage.  Cement mixed and once ready for  implantation the size 2.5 mobile bearing tibial tray, size 2.5 posterior  stabilized femur and 35 patella are cemented into place.  The patella  was held with a clamp.  Trial 15-mm inserts placed and knee  held in full  extension and all extruded cement removed.  Once the cement was fully  hardened then the permanent 15 mm posterior stabilized rotating platform  insert is placed in the tibial tray.  Wound was copiously irrigated with  saline solution and the extensor mechanism closed over Hemovac drain  with interrupted #1 PDS.  Flexion against gravity is 135 degrees.  Tourniquet released with total time of 39 minutes.  Subcu closed with  interrupted 2-0 Vicryl and subcuticular running 4-0 Monocryl.  The  catheter for Marcaine pain pump is placed and the pump initiated.  The  Hemovac hooked to suction.  The incisions cleaned and dried and Steri-  Strips and bulky sterile dressing were applied.  She is then awakened  and transferred to recovery in stable condition.      Ollen Gross, M.D.  Electronically Signed     FA/MEDQ  D:  01/20/2006  T:  01/20/2006  Job:  [id number]

## 2010-07-11 NOTE — Consult Note (Signed)
NAME:  Alison Dunn, Alison Dunn                  ACCOUNT NO.:  [id number]   MEDICAL RECORD NO.:  [id number]          PATIENT TYPE:  OUT   LOCATION:  GYN                          FACILITY:  [facility]   PHYSICIAN:  De Blanch, M.D.DATE OF BIRTH:  [birth year]   DATE OF CONSULTATION:  DATE OF DISCHARGE:                                   CONSULTATION   CHIEF COMPLAINT:  Left pelvic mass.   INTERVAL HISTORY:  Since her last visit, the patient has had absolutely no  pelvic symptoms.  She denies any pelvic pain, pressure, vaginal bleeding, or  discharge.  She has had a follow-up ultrasound on March 18, 2005, which  shows the left ovary measures 5.5 x 4.2 x 3.6 cm.  There is blood flow  associated with the lesion and color Doppler imaging.  The mass is described  as heterogeneous.  There is no evidence of ascites.  The patient's CA-125  value was 39 on January 24th.  It previously was 106 on December 31, 2004.   HISTORY OF PRESENT ILLNESS:  The patient was found to have a left pelvic  mass at the time of a staging PET/CT scan in October, 2006.  This was  performed for staging of a stage IB non-small-cell lung cancer, which was  managed by a VATS procedure in October, 2006.  We elected to follow the  mass, given the patient's other medical problems.   PAST MEDICAL HISTORY:  1.  Hypertension.  2.  Gastroesophageal reflux disease.  3.  Benign tremor.  4.  Recent bilateral pulmonary emboli requiring anticoagulation.   CURRENT MEDICATIONS:  Coumadin, AcipHex, Celebrex, Inderal, Metamucil,  multivitamins, vitamin C, vitamin E.   FAMILY HISTORY:  Negative for gynecologic, breast, or colon cancer.   SOCIAL HISTORY:  The patient is married.  She has previously worked in the  tobacco plant.  She does not smoke.   OBSTETRICAL HISTORY:  Gravida 2.   REVIEW OF SYSTEMS:  A 10-point comprehensive review of systems is performed  and is negative, except as noted above.  It is noted that she has not  received any adjuvant therapy for her early lung cancer.   PHYSICAL EXAMINATION:  VITAL SIGNS:  Height 5 foot 5.  Weight 182 pounds.  Blood pressure 130/80, pulse 80, respiratory rate 20.  GENERAL:  The patient is a healthy elderly white female in no acute  distress.  HEENT:  Negative.  NECK:  Supple without thyromegaly.  ABDOMEN:  Soft and nontender.  No mass, organomegaly, ascites, or hernias  are noted.  PELVIC:  EG/BUS, vagina, bladder, and urethra are normal.  The cervix and  uterus are surgically absent.  Adnexa reveal some fullness in the left  adnexa.  Rectovaginal exam confirms.  EXTREMITIES:  Lower extremities are without edema and varicosities.   IMPRESSION:  A complex pelvic mass in the left pelvic sidewall, which seems  to have grown in size, based on prior ultrasound exam.  Curiously, the CA-  125 value has diminished considerably.  It may have been that her prior CA-  125 elevation was secondary  to her lung surgery.   In order to assess further the patient's disease status, we will go ahead  with another PET/CT scan.  If this continues to show hypermetabolism in the  adnexal mass, I believe it should be resected, as it may represent an early  ovarian cancer or metastatic lung cancer.  If we did do surgery, I would  attempt to do it laparoscopically.  The patient, her husband, and family  understand that we may need to convert to laparotomy and that the extent of  the surgery could include debulking and staging of an ovarian cancer.  We  will make final determinations following a review of her PET scan, which is  scheduled for next week.      De Blanch, M.D.  Electronically Signed     DC/MEDQ  D:  03/24/2005  T:  03/25/2005  Job:  [id number]   cc:   Lajuana Matte, MD  Fax: [phone]   Telford Nab, R.N.  [address]  [place], [place] [address]

## 2010-07-11 NOTE — Discharge Summary (Signed)
NAME:  Alison Dunn, Alison Dunn                  ACCOUNT NO.:  [id number]   MEDICAL RECORD NO.:  [id number]           PATIENT TYPE:   LOCATION:                                 FACILITY:   PHYSICIAN:  Ladell Pier, M.D.        DATE OF BIRTH:   DATE OF ADMISSION:  DATE OF DISCHARGE:                                 DISCHARGE SUMMARY   DISCHARGE DIAGNOSES:  1.  Pulmonary embolism seen on CT scan of the chest, but negative lower      extremity Dopplers.  2.  Hypertension.  3.  Central tremor.  4.  Newly diagnosed lung cancer, status post left lobectomy.  5.  Gastroesophageal reflux disease.  6.  Osteoarthritis.  7.  Diverticulosis.  8.  Vertigo.  9.  Obesity.   DISCHARGE MEDICATIONS:  1.  Coumadin 5 mg daily.  2.  Prinivil/hydrochlorothiazide daily.  3.  Aciphex 20 mg daily.  4.  Aspirin 81 mg daily.  5.  __________ 200 mg daily.  6.  Inderal 80 mg daily.  7.  Tylenol p.r.n. for pain.  8.  Tylox p.r.n. for pain.   CONSULTANTS:  Cardiology.   PROCEDURE:  None.   FOLLOWUP:  The patient is to followup for INR for 1 week.   HISTORY OF PRESENT ILLNESS:  The patient is a 77 year old female with a past  medical history significant for a stage IV lung cancer.  The patient  presented to the emergency room with shortness of breath.  She had recently  had a VATS procedure when she was in the hospital from October 19 to the 25,  secondary to lung cancer.  She had an upper lobectomy.  Patient came to the  emergency room complaining of shortness of breath.  Cardiology was consulted  and did a D-dimer, a CT scan which showed pulmonary embolism.  She was  admitted for treatment of her pulmonary embolism.   Past medical history, family history, social history, medications,  allergies, review of systems per admission H&P.   PHYSICAL EXAMINATION ON DISCHARGE:  VITAL SIGNS:  Blood pressure 92/60, at  the time of discharge (to hold Prinzide until hospital followup).  Pulse 80,  temperature 98.6,  saturating 97% on room air.  HEENT:  Normocephalic, atraumatic.  Pupils are equal, round, and reacted to  light without erythema.  CARDIOVASCULAR:  Regular rate and rhythm, 2/6 systolic murmur.  LUNGS:  Clear bilaterally.  ABDOMEN:  Positive bowel sounds  __________ of edema.   HOSPITAL COURSE BY PROBLEM LIST:  Problem #1:  PULMONARY EMBOLISM.  She was  admitted to the hospital treated for pulmonary embolism with heparin plus  Coumadin.  She had a lower extremity Doppler that was negative. On the day  of discharge INR was 2.5 so she was to followup for INR in a week.  She had  cardiac enzymes done that were normal.   Problem #2:  HYPERTENSION.  Her blood pressure remained stable throughout  her hospitalization, however, prior to discharge her blood pressure was  running a little low.  She was told  to hold her blood pressure medication  until she follows up as an outpatient.   Problem #3:  GERD. She was continued on her PPI during her hospital stay.   Problem #4.  LUNG CANCER.  She is status post lobectomy and has an  appointment to followup with radiation oncology outpatient.   DISCHARGE LABS:  WBC 6.9. hemoglobin 11.3, platelets 419, INR 2.5.  Sodium  135, potassium 4.2, chloride 98, CO2 26, glucose 117, BUN 13, creatinine  1.0, triglycerides 136, HDL 39, LDL 92.      Ladell Pier, M.D.  Electronically Signed     NJ/MEDQ  D:  02/07/2005  T:  02/10/2005  Job:  [id number]

## 2010-07-11 NOTE — H&P (Signed)
Alison Dunn, Alison Dunn                  ACCOUNT NO.:  [id number]   MEDICAL RECORD NO.:  [id number]          PATIENT TYPE:  INP   LOCATION:  NA                           FACILITY:  [facility]   PHYSICIAN:  Ollen Gross, M.D.    DATE OF BIRTH:  Jun 05, 1931   DATE OF ADMISSION:  12/08/2004  DATE OF DISCHARGE:                                HISTORY & PHYSICAL   CHIEF COMPLAINT:  Left knee pain.   HISTORY OF PRESENT ILLNESS:  Patient is a 77 year old female who has had  progressively worsening problems with the left knee.  She has been treated  for left knee arthritis for some time now, utilizing conservative measures,  including injections.  She has undergone a multiple series of Synvisc  injections and has done well in the past; however, unfortunately, the knee  has continued to progress to the point where she is having pain all of the  time, and it is interfering with her daily activities.  She has reached a  point where she would like to have something done about it.  X-rays show  significant bone-on-bone change of the medial compartment with also slight  varus deformity.  Risks and benefits discussed and subsequently admitted to  the hospital.   ALLERGIES:  No known drug allergies.   CURRENT MEDICATIONS:  1.  AcipHex 20 mg daily.  2.  Vitamin E 400 IU daily.  3.  Centrum Silver daily.  4.  Vitamin C 500 mg daily.  5.  Aspirin 81 mg daily.  6.  Inderal LA 80 mg daily.  7.  Prinzide 20/25 daily.  8.  Celebrex 200 mg daily.  9.  Glucosamine DS 2 daily.  10. Metamucil.   PAST MEDICAL HISTORY:  1.  Reflux disease.  2.  Hypertension.  3.  Benign tremors.  4.  History of cystitis.   PAST SURGICAL HISTORY:  1.  Colonoscopy with polypectomy, benign.  2.  Partial hysterectomy.   FAMILY HISTORY:  Mother deceased at age 67 with a history of stroke and  heart cath, MI.  Father with history of MI, deceased at age 68.   SOCIAL HISTORY:  Married.  Two children.  Denies use of tobacco  products or  alcohol products.   REVIEW OF SYSTEMS:  GENERAL:  No fevers, chills, night sweats.  NEURO:  No  seizures, syncope, paralysis.  RESPIRATORY:  No shortness of breath,  productive cough, or hemoptysis.  CARDIOVASCULAR:  No chest pain, angina, or  orthopnea.  GI:  No nausea, vomiting, diarrhea, or constipation.  GU:  No  dysuria, hematuria, or discharge.  MUSCULOSKELETAL:  Left knee, found in the  history of present illness.   PHYSICAL EXAMINATION:  VITAL SIGNS:  Pulse 68, respirations 14, blood  pressure 114/74.  GENERAL:  A 77 year old white female who is well-developed and well-  nourished in no acute distress.  Slightly overweight.  Mildly anxious at the  time of exam.  Alert, oriented and cooperative.  Very pleasant.  Accompanied  by her husband and daughter.  HEENT:  Normocephalic and atraumatic.  Pupils are  round and reactive.  Oropharynx is clear.  EOMs are intact.  NECK:  Supple.  CHEST:  Clear.  No rales, rhonchi or wheezes.  HEART:  Regular rate and rhythm without murmur.  S1 and S2 noted.  ABDOMEN:  Soft, nontender.  Slightly round.  Bowel sounds present.  RECTAL/BREASTS/GENITALIA:  Not done.  Not pertinent to the present illness.  EXTREMITIES:  Left knee:  No effusion is noted.  Slight varus malalignment.  Range of motion 5-115 degrees.  There is no instability.   X-RAYS:  Preoperative chest x-ray, which was done on an outpatient basis for  preop clearance, was told to the patient that it was abnormal, that she  needed a CT scan.  We do not have the chest x-ray at the time of this  dictation, but she does have her CT scan scheduled for the end of this week.  Will await the results of the chest x-ray and follow up the CT scan.   IMPRESSION:  1.  Osteoarthritis, left knee.  2.  Reflux disease.  3.  Hypertension.  4.  Benign tremors.  5.  History of cystitis.   PLAN:  Patient admitted to [facility] to undergone a left total  knee arthroplasty.   Surgery will be performed by Dr. Trudee Grip.  Dr.  Olena Leatherwood is the patient's medical physician.  Dr. Olena Leatherwood will be notified of  the room number on admission and will be consulted if needed for medical  assistance with the patient throughout the hospital course.      Alexzandrew L. Julien Girt, P.A.      Ollen Gross, M.D.  Electronically Signed    ALP/MEDQ  D:  11/27/2004  T:  11/27/2004  Job:  [id number]   cc:   Ladell Pier, M.D.  Fax: [phone]

## 2010-07-11 NOTE — Consult Note (Signed)
NAMEANACRISTINA, STEFFEK                  ACCOUNT NO.:  [id number]   MEDICAL RECORD NO.:  [id number]          PATIENT TYPE:  OUT   LOCATION:  GYN                          [facility]ACILITY[facility]  [facility]   PHYSICIAN:  De Blanch, M.D.DATE OF BIRTH:  10/06/1931   DATE OF CONSULTATION:  DATE OF DISCHARGE:                                   CONSULTATION   REFERRING PHYSICIAN:  Lajuana Matte, MD   CHIEF COMPLAINT:  Increased activity on PET scan in the left ovary.   HISTORY OF PRESENT ILLNESS:  Patient was found to have a stage IB non-small  cell lung cancer, undergoing a VATS procedure on December 11, 2004.  The  postoperative course was complicated by bilateral pulmonary emboli.  In the  course of staging the patient, a PET scan was performed, which showed  increased activity in the lungs as well as in the left ovarian region.  Subsequent evaluation of the left ovary did not reveal any enlargement of  the ovary.  In fact, the ovary was difficult to identify on ultrasound  examination.   Patient denies any gynecologic symptoms.  She has a past history of  undergoing a hysterectomy for abnormal bleeding.  She denies any GI or GU  symptoms.   PAST MEDICAL HISTORY:  1.  Hypertension.  2.  Gastroesophageal reflux disease.  3.  Benign tremor.  4.  Bilateral pulmonary emboli.   CURRENT MEDICATIONS:  Coumadin.  AcipHex.  Celebrex.  Inderal.  Metamucil.  Multivitamins.  Vitamin C and vitamin E.   FAMILY HISTORY:  Negative for gynecologic, breast, or colon cancer.   SOCIAL HISTORY:  The patient is married.  She worked in a tobacco plant  previously.   OBSTETRICAL HISTORY:  Gravida 2.  She is not a smoker.   REVIEW OF SYSTEMS:  A 10-point comprehensive review of systems is performed  and is negative, except as noted above.   PHYSICAL EXAMINATION:  VITAL SIGNS:  Height 5 foot 5.  Weight 180.  Blood  pressure 130/80, pulse 80, respirations 18.  GENERAL:  The patient is a healthy white female  in no acute distress.  She  does have a slight tremor.  HEENT:  Negative.  NECK:  Supple without thyromegaly.  There is no supraclavicular or inguinal  adenopathy.  ABDOMEN:  Soft and nontender.  No mass, organomegaly, ascites, or hernias  are noted.  PELVIC:  EG/BUS, vagina, bladder, and urethra are normal.  The cervix and  uterus are surgically absent.  Adnexa are without masses.  Rectovaginal exam  confirms.  EXTREMITIES:  Lower extremities are without edema and varicosities.   IMPRESSION:  Stage IB non-small cell lung carcinoma of the lungs, status  post resection.  Increased uptake in the region of the left ovary on PET  scan.  The ovary is not enlarged on ultrasound.   We discussed the management options, which might include surgical resection  versus observation.  After discussing the pro's and con's of these, the  risks and benefits, and considering the fact that the patient is on  anticoagulant therapy, we have  come to the agreement that we will follow  this and would suggest a repeat ultrasound in approximately two months.  We  will also repeat a CA-125 at that time, given that the most recent CA-125  (December 31, 2004) was 106 U/ml.  I explained that the CA-125 could be, by  virtue of the patient having pulmonary surgery and the reaction caused by  surgical intervention.  All of the patient's questions were answered, along  with the questions of her family.  We will schedule her to have an  ultrasound and a CA-125 in two months.  Based on those findings, we may  recommend that she have a repeat PET scan as well.      De Blanch, M.D.  Electronically Signed     DC/MEDQ  D:  01/13/2005  T:  01/13/2005  Job:  [id number]   cc:   Lajuana Matte, MD  Fax: [phone]   Telford Nab, R.N.  [address]  [place], [place] [address]

## 2010-07-11 NOTE — Discharge Summary (Signed)
Alison Dunn, Alison Dunn                  ACCOUNT NO.:  [id number]   MEDICAL RECORD NO.:  [id number]          PATIENT TYPE:  INP   LOCATION:  2029                         FACILITY:  [facility]   PHYSICIAN:  Ines Bloomer, M.D. DATE OF BIRTH:  02/02/1932   DATE OF ADMISSION:  12/11/2004  DATE OF DISCHARGE:  12/17/2004                                 DISCHARGE SUMMARY   HISTORY OF PRESENT ILLNESS:  The patient is a 77 year old female referred to  Dr. Edwyna Shell in thoracic surgical consultation.  The patient was recently  undergoing preoperative medical clearance for a left knee replacement.  Upon  evaluation, she was found to have a lesion in the left lung.  This was  measured at 3.1 x 3 x 2 cm in the left upper lobe and was speculated in  character.  The patient has had no hemoptysis, fever, chills, or weight  loss.  She had a PET scan which revealed the lesion to be positive with a  SUV of 16.9.  There was also reportedly some uptake in the left ovary which  was slightly enlarged, but no mass was seen.  She was also found to have a  large hiatal hernia.  A CT scan of the brain was negative for metastatic  disease.  Dr. Edwyna Shell evaluated the patient and studies and recommended  proceeding with left video-assisted thoracoscopy with possible segmentectomy  versus left upper lobectomy.  She was admitted this hospitalization for the  procedure.   PAST MEDICAL HISTORY:  Hypertension.   ALLERGIES:  No known drug allergies.   MEDICATIONS PRIOR TO ADMISSION:  1.  Prinzide 20/25 mg one daily.  2.  Inderal 80 mg daily.  3.  Aciphex 20 mg daily.  4.  Celebrex 200 mg daily.  5.  Vitamin E daily.  6.  Vitamin C daily.  7.  Centrum Silver daily.  8.  Metamucil p.r.n.   FAMILY HISTORY:  Please see history and physical done at the time of  admission.   SOCIAL HISTORY:  Please see history and physical done at the time of  admission.   REVIEW OF SYSTEMS:  Please see the history and physical done at the  time of  admission.   PHYSICAL EXAMINATION:  Please see the history and physical done at the time  of admission.   HOSPITAL COURSE:  The patient was admitted electively on December 11, 2004,  taken to the operating room where she underwent the following procedure:  Left video-assisted thoracoscopy with miny thoracotomy, left upper  lobectomy, and lymph node sampling.  The frozen section revealed  adenocarcinoma.  She tolerated the procedure well and was taken to the post-  anesthesia care unit in stable condition.   POSTOPERATIVE HOSPITAL COURSE:  The patient has done well postoperatively.  All routine lines, monitors, and drainage devices have been discontinued in  the standard fashion.  She did have a short-term air leak in one of her  tubes;  however, this improved clinically and the tubes were discontinued  without difficulty.  The patient has remained hemodynamically stable.  Oxygen  has been weaned and maintains good saturations on room air.  She is  tolerating a routine advancement in activity, using protocols.  Incision is  healing well without signs of infection.  She has been seen in oncology  consultation by Dr. Arbutus Ped.  He will follow up with her as an outpatient at  the [facility] for a detailed discussion.  Pathology reveals a  stage IB non-small cell lung cancer with pleural involvement.  The final  pathology report is currently pending to the chart for further details.  Overall, the patient is felt to be stable for tentative discharge in the  morning of December 17, 2004, pending morning round re-evaluation.   DISCHARGE MEDICATIONS:  1.  Aspirin 81 mg daily.  2.  Inderal LA 80 mg daily.  3.  Celebrex 200 mg daily.  4.  Prinzide 20/25 mg one daily.  5.  Aciphex 20 mg daily.  6.  She is to resume her vitamins at her own discretion.  7.  For pain, Tylox one to two p.o. q.4-6h. p.r.n.   INSTRUCTIONS:  The patient received written instructions in regard to   medications, activity, diet, wound care, and followup.   FOLLOWUP:  Dr. Edwyna Shell on Tuesday, December 23, 2004, at 3 p.m.  A chest x-ray  will be obtained one hour prior to this appointment at [facility].   FINAL DIAGNOSIS:  Stage IB non-small cell lung carcinoma of the left upper  lobe, now status post left upper lobectomy.   OTHER DIAGNOSES:  1.  Hypertension.  2.  Osteoarthritis with future plans for possible left total knee      replacement.  3.  Chronic obstructive pulmonary disease.   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      Rowe Clack, P.A.-C.    ______________________________  Ines Bloomer, M.D.    Sherryll Burger  D:  12/16/2004  T:  12/16/2004  Job:  [id number]   cc:   Lajuana Matte, MD  Fax: [phone]   Ladell Pier, M.D.  Fax: [phone]

## 2010-07-11 NOTE — Discharge Summary (Signed)
[name], Alison Dunn                  ACCOUNT NO.:  [id number]   MEDICAL RECORD NO.:  [id number]          PATIENT TYPE:  INP   LOCATION:  1611                         FACILITY:  [facility]   PHYSICIAN:  Lebron Conners, M.D.   DATE OF BIRTH:  25-Aug-1931   DATE OF ADMISSION:  04/21/2005  DATE OF DISCHARGE:  04/26/2005                                 DISCHARGE SUMMARY   HISTORY:  This is a 77 year old white female who was found to have a pelvic  mass thought to be most consistent with ovarian cancer. She was admitted for  operation for that. There had not been any history of fever or GI symptoms  suggesting colon problems. See the history and physical for further details.  Other medical problems are hypertension, GERD, benign essential tremor, and  history of bilateral pulmonary emboli. The patient had been on Coumadin and  was taken off for the surgery.   HOSPITAL COURSE:  The patient was admitted and operated on by Dr. De Blanch. During the operation, his findings were those suggesting  sigmoid colon diverticulitis with abscess involving the right adnexa rather  than a primary ovarian neoplasm or infection. He asked me to consult in the  operating room and I came in and agreed with his assessment. The patient had  had a good bowel prep and there was no peritonitis so I elected to do a  sigmoid resection with primary anastomosis. Dr. Stanford Breed did bilateral  salpingo-oophorectomy. On pathologic examination, the clinical impression  was confirmed with diverticulitis and abscess being found, no ovarian  neoplasm. The patient generally did well after surgery with gradual  improvement in her status and slow but steady return of gastrointestinal  function. I advanced her diet as she tolerated and she had nice healing of  the wound without evidence of infection and good return of GI function. The  Coumadin was reinstituted prior to discharge and INR was almost back to  therapeutic  range before discharge. On April 26, 2005 she felt well enough to  go home and was afebrile. Hemoglobin was 8.1 but her vital signs were fine  and she felt well enough to go home. The wounds were healing well and the  staples were out and Steri-Strips applied. She was sent home on her usual  Coumadin dosage. She is to follow-up with me in 2-3 weeks.   DIAGNOSIS:  1.  Diverticulitis of the sigmoid colon with abscess and inflammatory mass.  2.  Hypertension.  3.  Gastroesophageal reflux disease.  4.  Anticoagulated state because of recent pulmonary emboli.   OPERATION:  1.  Sigmoid colectomy with anastomosis.  2.  Bilateral salpingo-oophorectomy.   DISCHARGE CONDITION:  Improved.      Lebron Conners, M.D.  Electronically Signed     WB/MEDQ  D:  05/06/2005  T:  05/07/2005  Job:  [id number]   cc:   De Blanch, M.D.  [address]  [place]  [place] [address]   Lajuana Matte, MD  Fax: [phone]   Roseanna Rainbow, M.D.  Fax: [phone]

## 2010-07-11 NOTE — Discharge Summary (Signed)
Alison Dunn, Alison Dunn                  ACCOUNT NO.:  [id number]   MEDICAL RECORD NO.:  [id number]          PATIENT TYPE:  INP   LOCATION:  1507                         FACILITY:  [facility]   PHYSICIAN:  Ollen Gross, M.D.    DATE OF BIRTH:  01-Feb-1932   DATE OF ADMISSION:  01/20/2006  DATE OF DISCHARGE:  01/23/2006                               DISCHARGE SUMMARY   ADMITTING DIAGNOSES:  1. Osteoarthritis of left knee.  2. Hypertension.  3. Reflux disease.  4. History of diverticulosis.  5. History of cystitis.  6. Lung cancer. Status post resection.  7. History of pulmonary embolism following lung surgery.   DISCHARGE DIAGNOSES:  1. Osteoarthritis of left knee. Status post left total knee      arthroplasty.  2. Postop hyponatremia improved.  3. Hypertension.  4. Reflux disease.  5. History of diverticulosis.  6. History of cystitis.  7. Lung cancer. Status post resection.  8. History of pulmonary embolism following lung surgery.   PROCEDURE:  01/12/2006 left total knee surgery Dr. Lequita Halt.   ANESTHESIA:  General.   DISCHARGE TIME:  39 minutes.   CONSULTS:  None.   BRIEF HISTORY:  The patient is a 77 year old female with osteoarthritis  of left knee with intractable pain failed inoperative management  including injections now presents for total knee.   LABORATORY DATA:  Preop CBC showed a hemoglobin 13.2, hematocrit 39.9.  Chem panel all within normal limits. Preop UA negative. PT INR 12.3 and  1.3 preoperatively. PTT of 29. Serial CBCs were followed. Hemoglobin did  get down to 11.3, last hemoglobin ws 10.6. Serial pro times were  followed throughout hospital course but last PT INR of 20.5 and 1.7.  Chem panel as followed. Sodium did drop 135 down to 132. Blood group  type O positive.   HOSPITAL COURSE:  Patient has been at [facility], tolerated  procedure well, later was transferred to recovery room . Started on PCA  and pain analgesics for pain control following  the surgery. Hemovac was  placed during surgery and was pulled on day 1. Had a pretty good night  of the surgery then was doing pretty well by the next day. Started to  get physical therapy. By day 2 was already showing some progress with  therapy, getting up about 15 feet then later 65 feet. Dressing was  changed. Incision looked excellent. Sodium was down a little bit down  132 so we D/C'd the fluids. Incision looked good. Continued progressive  physical therapy. Got up to about over 120 feet by the following day of  day 3. Had been weaned off the PCA by day #2 and was tolerating p.o.  meds. Doing well by day 3. Tolerating meds, was ready to go home.   DISCHARGE PLAN:  Patient is discharged home on 01/23/2006.   DISCHARGE DIAGNOSIS:  Please see above.   DISCHARGE MEDICATIONS:  Coumadin, Vicodin, and Robaxin.   DIET:  As tolerated.   ACTIVITY:  Weight bear as tolerated. Total knee protocol.   FOLLOWUP:  Follow up with PT first thing. Follow  up in 2 weeks.   DISPOSITION:  Home.   CONDITION ON DISCHARGE:  Improved.      Alison Dunn, P.A.      Ollen Gross, M.D.  Electronically Signed    ALP/MEDQ  D:  02/17/2006  T:  02/17/2006  Job:  [id number]   cc:   Ollen Gross, M.D.  Fax: [phone]   Ladell Pier, M.D.  Fax: [phone]   Lajuana Matte, MD  Fax: [phone]   Ines Bloomer, M.D.  [address]  [place]  [place] [address]

## 2010-07-11 NOTE — Consult Note (Signed)
NAME:  Alison Dunn, Alison Dunn                  ACCOUNT NO.:  [id number]   MEDICAL RECORD NO.:  [id number]          PATIENT TYPE:  INP   LOCATION:  3731                         FACILITY:  [facility]   PHYSICIAN:  Ladell Pier, M.D.   DATE OF BIRTH:  1931-11-25   DATE OF CONSULTATION:  12/23/2004  DATE OF DISCHARGE:                                   CONSULTATION   Alison Dunn is a 77 year old white female with past medical history  significant for stage IB lung cancer, hypertension, osteoarthritis.  The  patient presented to the ED with shortness of breath x one day.  She was in  the hospital from December 11, 2004, to December 17, 2004, for a VATS  procedure secondary to lung cancer and she had a left upper lobectomy.  The  patient had appointment to follow up with Dr. Arbutus Ped next week for  chemotherapy.  The lung cancer was incidentally found when she had preop  evaluation for knee surgery secondary to osteoarthritis and degenerative  disease in her knee.   PAST MEDICAL HISTORY:  Significant for:  1.  Hypertension.  2.  GERD.  3.  Osteoarthritis.  4.  Diverticulosis.  5.  Vertigo.  6.  Benign tremors.  7.  Obesity.  8.  History of abnormal left mammogram.  9.  Post menopausal syndrome.   FAMILY HISTORY:  Her father died at age 3 from heart attack.  Her mother  died at age 51 from a stroke.  She has a sister with Crohn's disease and  another with chronic fatigue, one who has diabetes.   SOCIAL HISTORY:  She does not smoke or drink.  She does housework and yard  work.  She is married.   MEDICATIONS:  Vitamin E 400 mg daily, Centrum Silver daily, vitamin C 500 mg  daily, aspirin 81 mg daily, Prinzide 20/25 daily, Celebrex 200 mg daily,  glucosamine tablets daily, Metamucil daily, AcipHex 20 mg daily, Caltrate  DBID, Propranolol LA 80 mg daily.   ALLERGIES:  Hyoscyamine causes hallucinations, intolerant to codeine, did  not tolerate penicillin in the past, now tolerates it OK.   REVIEW OF  SYSTEMS:  As stated in HPI.   PHYSICAL EXAMINATION:  VITAL SIGNS:  Temperature 97.4, pulse 94, respirations 18, blood pressure  104/78, pulse oximetry 93% on 2 liters.  HEENT:  Normocephalic, atraumatic, pupils equal, round, reactive to light,  throat without erythema.  CARDIOVASCULAR:  Regular rate and rhythm with a 2/6 systolic murmur.  LUNGS:  Clear bilaterally.  No wheezes, rhonchi, or rales.  Sutures on her  back.  ABDOMEN:  Positive bowel sounds.  EXTREMITIES:  Without edema.  2+ pedal pulses bilaterally.   LABORATORY DATA:  WBC 10.2, hemoglobin 11.5, platelets 382.  Cholesterol  158, triglycerides 136, HDL 39, LDL 92, BNP 221.  CT scan showed bilateral  pulmonary embolism with large clot burden.  Cardiac enzymes with CK 34, MB  2.6, troponin 0.29.   ASSESSMENT/PLAN:  1.  Pulmonary embolism.  Agree with heparin plus Coumadin and lower      extremity Dopplers.  2.  Hypertension, blood pressure good.  3.  GERD, patient on PPI.      Ladell Pier, M.D.  Electronically Signed     NJ/MEDQ  D:  12/23/2004  T:  12/23/2004  Job:  [id number]

## 2010-07-11 NOTE — H&P (Signed)
Alison Dunn, Alison Dunn                  ACCOUNT NO.:  [id number]   MEDICAL RECORD NO.:  [id number]          PATIENT TYPE:  INP   LOCATION:  NA                           FACILITY:  [facility]   PHYSICIAN:  Ines Bloomer, M.D. DATE OF BIRTH:  09/02/1931   DATE OF ADMISSION:  12/11/2004  DATE OF DISCHARGE:                                HISTORY & PHYSICAL   CHIEF COMPLAINT:  Left lung mass.  This 77 year old patient was undergoing  preoperative medical clearance for a left knee replacement surgery.  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  She was found to have a 3.1 x 3 x 2 spiculated nodule of  the left upper lobe.  She has had no hemoptysis, fever, or chills, no weight  loss.  Her pulmonary function tests showed an FVC of 1.79 or 62% of  predicted, and a FEV1 of 1.36 also 62% of predicted.  She had shortness of  breath with exertion.  No history of asthma.  A PET scan was done which  revealed that the left upper lobe was positive with an SUV of 16.9.  There  also was some uptake in the left ovary which was slightly enlarged, but no  mass was seen.  She also has a large hiatal hernia.  A CT scan of the brain  was negative.   PAST MEDICAL HISTORY:  She has no allergies.  She has been treated for  hypertension.  She is on:  1.  Prinzide 20/25 one daily.  2.  Inderal 80 mg daily.  3.  Aciphex 20 mg daily.  4.  Celebrex 200 mg daily.  5.  Vitamin E.  6.  Vitamin C.  7.  Centrum Silver.  8.  Metamucil.   FAMILY HISTORY:  Negative for vascular disease and cancer.   SOCIAL HISTORY:  She is married, has two children, is retired.  She does not  drink alcohol on a regular basis, has never smoked.   REVIEW OF SYSTEMS:  She is 175 pounds, she is 5 foot 5 inches, weight has  been stable.  CARDIAC:  There is no angina, atrial fibrillation.  PULMONARY:  See history of present illness  GI:  Hiatal hernia with chronic reflux, no  nausea  or vomiting.  GU:  No dysuria or frequent urination.  CARDIOVASCULAR:  No TIA, DVT or claudication.  NEUROLOGIC:  No headaches, blackouts or  seizures.  ORTHOPEDIC:  She has severe arthritis particularly of her left  leg for which she takes Celebrex and was going to have a knee replacement  surgery.  PSYCHIATRIC:  No psychiatric illness.  ENT:  No change in her  eyesight or hearing.  SKIN:  Without lesions.  HEMATOLOGIC:  __________ .   PHYSICAL EXAMINATION:  Her blood pressure is 122/80, pulse 78, respirations  18, saturation 97%.  HEAD:  Atraumatic.  Eyes -- pupils are equal, react to light and  accommodation.  Extraocular movements are normal.  Ears -- tympanic membrane  intact.  Nose --  there is no septal deviation.  Turbinates without lesion.  NECK:  Supple, there is no thyromegaly, no supraclavicular or axillary  adenopathy.  CHEST:  Clear to auscultation and percussion.  HEART:  Regular sinus rhythm, no murmurs.  ABDOMEN:  Soft, bowel sounds normal, there is no hepatosplenomegaly.  EXTREMITIES:  Pulses are 2+, there is no clubbing or edema.  She does have  some swelling in her left knee.  NEUROLOGIC:  She is oriented x3.  Cranial nerves II-XIII are intact.  Sensory and motor intact.  SKIN:  Without lesions.   IMPRESSION:  1.  Left upper lobe mass.  2.  Hypertension.  3.  Mild chronic obstructive pulmonary disease.   PLAN:  Left VATS by superior segmentectomy or a left upper lobectomy.           ______________________________  Ines Bloomer, M.D.     DPB/MEDQ  D:  12/08/2004  T:  12/08/2004  Job:  [id number]

## 2010-07-11 NOTE — Op Note (Signed)
Alison Dunn, Alison Dunn                  ACCOUNT NO.:  [id number]   MEDICAL RECORD NO.:  [id number]          PATIENT TYPE:  INP   LOCATION:  1008                         FACILITY:  [facility]   PHYSICIAN:  De Blanch, M.D.DATE OF BIRTH:  [birth year]   DATE OF PROCEDURE:  04/21/2005  DATE OF DISCHARGE:                                 OPERATIVE REPORT   PREOPERATIVE DIAGNOSES:  1.  Complex pelvic mass with abnormal PET scan.  2.  History of stage I lung cancer.   POSTOPERATIVE DIAGNOSES:  1.  Tubo-ovarian abscess with diverticulitis.  2.  Retroperitoneal fibrosis.   PROCEDURE:  Diagnostic laparoscopy, exploratory laparotomy, bilateral  salpingo-oophorectomy, left ureterolysis (sigmoid colectomy with end-to-end  anastomosis performed by Dr. Lebron Conners).   SURGEON:  De Blanch, M.D.   ASSISTANT:  Roseanna Rainbow, M.D. and Telford Nab, R.N.   ANESTHESIA:  General with orotracheal tube.   ESTIMATED BLOOD LOSS:  100 mL.   SURGICAL FINDINGS:  At the time of diagnostic laparoscopy, there was a  densely adherent mass apparently arising in the retroperitoneum and  involving the sigmoid colon on the left. This could not be separated and  therefore exploratory laparotomy was performed. At the time of exploratory  laparotomy, the upper abdomen including the diaphragm, liver, spleen,  stomach, omentum, small and large bowel were normal except for the sigmoid  colon. Throughout the surgical procedure, it was ultimately recognized the  patient had a tubo-ovarian abscess densely adherent to the sigmoid colon and  the left pelvic sidewall as well as retroperitoneal fibrosis. The patient  had extensive diverticulosis and diverticulitis and on frozen section we  were told there were no other abnormalities except for extensive  diverticulitis.   PROCEDURE:  The patient was brought to the operating room and after  satisfactory attainment of general anesthesia was placed  in a modified  lithotomy position in Gahanna stirrups. The anterior abdominal wall, perineum  and vagina were prepped with Betadine, a Foley catheter was inserted and a  sponge stick was placed in the vagina. An open laparoscopy was performed  through an incision in the umbilicus which entered the peritoneal cavity  without any injury to underlying surfaces. The laparoscope was placed and  then a 12 mm suprapubic port and two 5 mm lateral ports were placed under  direct visualization. Peritoneal washings were obtained and held for  cytopathology ultimately being discarded when no evidence of malignancy was  identified. The laparoscope was placed and inspection of the upper abdomen  and then pelvis was performed. There were extensive adhesions of the sigmoid  colon to the left pelvic sidewall mass. The round ligament was divided and  the lateral peritoneum likewise incised with care being taken to avoid  injury to underlying vessels. The retroperitoneum was densely fibrotic and  at this juncture it was determined that laparoscopy would be unsuccessful  and therefore we converted to laparotomy. A midline incision was made and a  Bookwalter retractor was positioned, the small bowel was packed out of the  pelvis. The retroperitoneum on the left pelvis was further explored  ultimately identifying the vessels and ureter. Because of dense  retroperitoneal fibrosis, ureterolysis was performed mobilizing the ureter  away from the mass and keeping it lateral and away from injury. The ovarian  vessels were skeletonized, clamped, cut free tied and suture ligated. The  ovarian mass was densely adherent to the sigmoid colon and could not be  separated. The paravesical space was opened and the presacral space opened  in order to get further mobility. At this juncture, it was noted that the  sigmoid mesentery was quite enlarged and thickened and edematous. The  bladder flap was developed. The manipulation of  the ovary released a  considerable amount of purulent material consistent with a tubo-ovarian  abscess. It was felt that this most likely was secondary to diverticulitis  and therefore a general surgery consultation was obtained from Dr. Marcy Panning. While awaiting Dr. Cammie Sickle arrival, we proceeded with a right  salpingo-oophorectomy. The right pelvic sidewall was opened, the vessels  identified, the ureter identified, the ovarian vessels were skeletonized,  clamped, cut, free tied and suture ligated. The peritoneum underneath the  ovary and fallopian tube was incised with care taken to avoid injury to the  ureter. Ultimately, the ovary and tube were removed in their entirety and  submitted to pathology.   At this juncture, Dr. Orson Slick arrived and felt that the patient be best  managed by performing a sigmoid colectomy with end-to-end anastomosis.   I assisted Dr. Orson Slick throughout the remainder of the sigmoid colectomy  which will be dictated separately. Following completion of the sigmoid  colectomy, a Jackson-Pratt drain was placed in the posterior pelvis and  exited through a stab wound in the left lower quadrant. The pelvis was  irrigated. The fascia was closed with a running mass closure using #1 PDS.  The subcutaneous tissue was irrigated, hemostasis achieved with cautery and  the skin was closed with skin staples including the port sites. It should be  noted that the fascial defects from the suprapubic and umbilical port  ultimately became part of the midline incision and therefore did not need to  be closed separately. A dressing was applied. The patient was awakened from  anesthesia and taken to the recovery room in satisfactory condition. Sponge,  needle and instrument counts were correct x2.      De Blanch, M.D.  Electronically Signed     DC/MEDQ  D:  04/21/2005  T:  04/22/2005  Job:  [id number]   cc:   Roseanna Rainbow, M.D.  Fax: [phone]  Telford Nab, R.N.  [address]  [place], [place] [address]   Lebron Conners, M.D.  [address], [address]  [place]  [place] [address]

## 2010-07-11 NOTE — Op Note (Signed)
Alison Dunn, Alison Dunn                  ACCOUNT NO.:  [id number]   MEDICAL RECORD NO.:  [id number]          PATIENT TYPE:  INP   LOCATION:  2899                         FACILITY:  [facility]   PHYSICIAN:  Ines Bloomer, M.D. DATE OF BIRTH:  1931-10-23   DATE OF PROCEDURE:  12/11/2004  DATE OF DISCHARGE:                                 OPERATIVE REPORT   PREOPERATIVE DIAGNOSIS:  Left upper lobe mass.   POSTOPERATIVE DIAGNOSIS:  Adenocarcinoma of the left upper lobe.   OPERATION PERFORMED:  Left VATS left upper lobectomy.   SURGEON:  Ines Bloomer, M.D.   ANESTHESIA:  General.   After percutaneous insertion of all monitoring lines, the patient underwent  general anesthesia and was prepped and draped in the usual sterile manner.  Two trocar sites were made at the anterior axillary line at the seventh  intercostal space and the posterior axillary line at the sixth intercostal  space.  Two trocars were inserted.  The 0-degree scope was inserted.  The  cancer could be seen in the apical posterior segment of the right upper  lobe.  It appeared to be too large to do anything other than a VATS  lobectomy.  So an access incision was made over the fourth intercostal space  at the anterior axillary line anteriorly with the latissimus not being  divided and the serratus split.  Small retractor was placed in to hold the  tissues open.  Dissection started anteriorly, dissecting out the  mediastinum, exposing the superior pulmonary vein.  This was dissected out  and then stapled and divided with the Autosuture stapler.  Then that exposed  the apical branch.  There was a 10L node that was dissected free, and then  the apical posterior branch was sutured with the Autosuture stapler and  divided.  This exposed an anterior branch which was in a like manner stapled  and divided with Autosuture stapler.  Finally, attention was turned to the  bronchus, and several nodes were dissected free from the  bronchus, 10L  nodes.  Finally, the fissure was partially divided with the EZ45 stapler,  exposing the lingular branch which was able to be stapled with an Autosuture  30 white roticulator staple.  The rest of the inferior portion of the  fissure was stapled with the EZ45 stapler.  The bronchus was divided with  the EZ-45 stapler.  The left upper lobe was placed in a pouch and removed.  The area was irrigated copiously.  There was no air leak seen.  CoSeal  applied to the staple line.  An On-Q catheter was placed subpleurally in the  usual fashion with the tunneling device and sutured in place with Steri-  Strips.  Marcaine block was done in the usual fashion.  Two chest tubes were  brought in through the trocar sites and tied in place with 0 silk.  The  inferior pulmonary ligament had been taken down under direct vision with the  scope and electrocautery.  The chest was closed with two pericostals of #1  Vicryl in the muscle layer, 2-0  Vicryl in the subcutaneous tissue and  Dermabond for the skin.  The patient was returned to the recovery room in  stable condition.           ______________________________  Ines Bloomer, M.D.     DPB/MEDQ  D:  12/11/2004  T:  12/11/2004  Job:  [id number]

## 2010-11-24 ENCOUNTER — Ambulatory Visit (HOSPITAL_COMMUNITY)
Admission: RE | Admit: 2010-11-24 | Discharge: 2010-11-24 | Disposition: A | Discharge status: Home / Self Care | Payer: Medicare Other | Source: Ambulatory Visit | Attending: Internal Medicine | Admitting: Internal Medicine

## 2010-11-24 ENCOUNTER — Encounter: Payer: Medicare Other | Admitting: Internal Medicine

## 2010-11-24 ENCOUNTER — Encounter (HOSPITAL_COMMUNITY): Payer: Self-pay

## 2010-11-24 DIAGNOSIS — R911 Solitary pulmonary nodule: Secondary | ICD-10-CM | POA: Insufficient documentation

## 2010-11-24 DIAGNOSIS — K449 Diaphragmatic hernia without obstruction or gangrene: Secondary | ICD-10-CM | POA: Insufficient documentation

## 2010-11-24 DIAGNOSIS — C3410 Malignant neoplasm of upper lobe, unspecified bronchus or lung: Secondary | ICD-10-CM

## 2010-11-24 DIAGNOSIS — C3490 Malignant neoplasm of unspecified part of unspecified bronchus or lung: Secondary | ICD-10-CM

## 2010-11-24 DIAGNOSIS — Z09 Encounter for follow-up examination after completed treatment for conditions other than malignant neoplasm: Secondary | ICD-10-CM | POA: Insufficient documentation

## 2010-11-24 DIAGNOSIS — Z85118 Personal history of other malignant neoplasm of bronchus and lung: Secondary | ICD-10-CM | POA: Insufficient documentation

## 2010-11-24 HISTORY — DX: Essential (primary) hypertension: I10

## 2010-11-24 HISTORY — DX: Malignant (primary) neoplasm, unspecified: C80.1

## 2010-11-24 LAB — CBC WITH DIFFERENTIAL/PLATELET
BASO%: 0.8 % (ref 0.0–2.0)
EOS%: 3.8 % (ref 0.0–7.0)
LYMPH%: 34.6 % (ref 14.0–49.7)
MCH: 26.5 pg (ref 25.1–34.0)
MCHC: 33.2 g/dL (ref 31.5–36.0)
MONO#: 0.6 10*3/uL (ref 0.1–0.9)
MONO%: 11 % (ref 0.0–14.0)
Platelets: 263 10*3/uL (ref 145–400)
RBC: 4.49 10*6/uL (ref 3.70–5.45)
WBC: 5.9 10*3/uL (ref 3.9–10.3)

## 2010-11-24 LAB — CMP (CANCER CENTER ONLY)
ALT(SGPT): 20 U/L (ref 10–47)
AST: 24 U/L (ref 11–38)
Alkaline Phosphatase: 61 U/L (ref 26–84)
Calcium: 9.2 mg/dL (ref 8.0–10.3)
Chloride: 101 mEq/L (ref 98–108)
Creat: 1.1 mg/dl (ref 0.6–1.2)

## 2010-11-24 MED ORDER — IOHEXOL 300 MG/ML  SOLN: 80.0000 mL | Freq: Once | INTRAMUSCULAR | Status: AC | PRN

## 2010-11-27 ENCOUNTER — Encounter (HOSPITAL_BASED_OUTPATIENT_CLINIC_OR_DEPARTMENT_OTHER): Payer: Medicare Other | Admitting: Internal Medicine

## 2010-11-27 ENCOUNTER — Other Ambulatory Visit: Payer: Self-pay | Admitting: Internal Medicine

## 2010-11-27 DIAGNOSIS — C3490 Malignant neoplasm of unspecified part of unspecified bronchus or lung: Secondary | ICD-10-CM

## 2010-11-27 DIAGNOSIS — C3410 Malignant neoplasm of upper lobe, unspecified bronchus or lung: Secondary | ICD-10-CM

## 2010-12-09 ENCOUNTER — Other Ambulatory Visit: Payer: Self-pay | Admitting: Geriatric Medicine

## 2010-12-09 DIAGNOSIS — Z1231 Encounter for screening mammogram for malignant neoplasm of breast: Secondary | ICD-10-CM

## 2011-01-12 ENCOUNTER — Ambulatory Visit
Admission: RE | Admit: 2011-01-12 | Discharge: 2011-01-12 | Disposition: A | Discharge status: Home / Self Care | Payer: Medicare Other | Source: Ambulatory Visit | Attending: Geriatric Medicine | Admitting: Geriatric Medicine

## 2011-01-12 DIAGNOSIS — Z1231 Encounter for screening mammogram for malignant neoplasm of breast: Secondary | ICD-10-CM

## 2011-03-09 DIAGNOSIS — Z7901 Long term (current) use of anticoagulants: Secondary | ICD-10-CM | POA: Diagnosis not present

## 2011-04-06 DIAGNOSIS — Z7901 Long term (current) use of anticoagulants: Secondary | ICD-10-CM | POA: Diagnosis not present

## 2011-05-06 DIAGNOSIS — Z7901 Long term (current) use of anticoagulants: Secondary | ICD-10-CM | POA: Diagnosis not present

## 2011-06-03 DIAGNOSIS — I10 Essential (primary) hypertension: Secondary | ICD-10-CM | POA: Diagnosis not present

## 2011-06-03 DIAGNOSIS — I2699 Other pulmonary embolism without acute cor pulmonale: Secondary | ICD-10-CM | POA: Diagnosis not present

## 2011-06-03 DIAGNOSIS — T81718A Complication of other artery following a procedure, not elsewhere classified, initial encounter: Secondary | ICD-10-CM | POA: Diagnosis not present

## 2011-06-03 DIAGNOSIS — Z79899 Other long term (current) drug therapy: Secondary | ICD-10-CM | POA: Diagnosis not present

## 2011-06-22 DIAGNOSIS — Z1211 Encounter for screening for malignant neoplasm of colon: Secondary | ICD-10-CM | POA: Diagnosis not present

## 2011-07-01 DIAGNOSIS — D509 Iron deficiency anemia, unspecified: Secondary | ICD-10-CM | POA: Diagnosis not present

## 2011-07-01 DIAGNOSIS — T81718A Complication of other artery following a procedure, not elsewhere classified, initial encounter: Secondary | ICD-10-CM | POA: Diagnosis not present

## 2011-07-01 DIAGNOSIS — I2699 Other pulmonary embolism without acute cor pulmonale: Secondary | ICD-10-CM | POA: Diagnosis not present

## 2011-07-23 ENCOUNTER — Telehealth: Payer: Self-pay | Admitting: Internal Medicine

## 2011-07-23 NOTE — Telephone Encounter (Signed)
pt had called re:ct/other appts,ct/appts made per mosaiq l.m with lab/md appts    aom

## 2011-07-29 DIAGNOSIS — T81718A Complication of other artery following a procedure, not elsewhere classified, initial encounter: Secondary | ICD-10-CM | POA: Diagnosis not present

## 2011-07-29 DIAGNOSIS — I2699 Other pulmonary embolism without acute cor pulmonale: Secondary | ICD-10-CM | POA: Diagnosis not present

## 2011-09-02 DIAGNOSIS — I2699 Other pulmonary embolism without acute cor pulmonale: Secondary | ICD-10-CM | POA: Diagnosis not present

## 2011-09-02 DIAGNOSIS — T81718A Complication of other artery following a procedure, not elsewhere classified, initial encounter: Secondary | ICD-10-CM | POA: Diagnosis not present

## 2011-09-09 DIAGNOSIS — T81718A Complication of other artery following a procedure, not elsewhere classified, initial encounter: Secondary | ICD-10-CM | POA: Diagnosis not present

## 2011-09-09 DIAGNOSIS — I2699 Other pulmonary embolism without acute cor pulmonale: Secondary | ICD-10-CM | POA: Diagnosis not present

## 2011-09-23 DIAGNOSIS — I2699 Other pulmonary embolism without acute cor pulmonale: Secondary | ICD-10-CM | POA: Diagnosis not present

## 2011-10-16 DIAGNOSIS — H16019 Central corneal ulcer, unspecified eye: Secondary | ICD-10-CM | POA: Diagnosis not present

## 2011-10-16 DIAGNOSIS — H16109 Unspecified superficial keratitis, unspecified eye: Secondary | ICD-10-CM | POA: Diagnosis not present

## 2011-10-19 DIAGNOSIS — I2699 Other pulmonary embolism without acute cor pulmonale: Secondary | ICD-10-CM | POA: Diagnosis not present

## 2011-10-19 DIAGNOSIS — H16049 Marginal corneal ulcer, unspecified eye: Secondary | ICD-10-CM | POA: Diagnosis not present

## 2011-11-17 DIAGNOSIS — T81718A Complication of other artery following a procedure, not elsewhere classified, initial encounter: Secondary | ICD-10-CM | POA: Diagnosis not present

## 2011-11-27 ENCOUNTER — Ambulatory Visit (HOSPITAL_COMMUNITY)
Admission: RE | Admit: 2011-11-27 | Discharge: 2011-11-27 | Disposition: A | Discharge status: Home / Self Care | Payer: Medicare Other | Source: Ambulatory Visit | Attending: Internal Medicine | Admitting: Internal Medicine

## 2011-11-27 ENCOUNTER — Other Ambulatory Visit (HOSPITAL_BASED_OUTPATIENT_CLINIC_OR_DEPARTMENT_OTHER): Payer: Medicare Other | Admitting: Lab

## 2011-11-27 DIAGNOSIS — R911 Solitary pulmonary nodule: Secondary | ICD-10-CM | POA: Diagnosis not present

## 2011-11-27 DIAGNOSIS — C3490 Malignant neoplasm of unspecified part of unspecified bronchus or lung: Secondary | ICD-10-CM | POA: Diagnosis not present

## 2011-11-27 DIAGNOSIS — K449 Diaphragmatic hernia without obstruction or gangrene: Secondary | ICD-10-CM | POA: Diagnosis not present

## 2011-11-27 DIAGNOSIS — C3410 Malignant neoplasm of upper lobe, unspecified bronchus or lung: Secondary | ICD-10-CM | POA: Diagnosis not present

## 2011-11-27 LAB — CBC WITH DIFFERENTIAL/PLATELET
BASO%: 1 % (ref 0.0–2.0)
Basophils Absolute: 0.1 10*3/uL (ref 0.0–0.1)
EOS%: 2.8 % (ref 0.0–7.0)
HCT: 39.7 % (ref 34.8–46.6)
LYMPH%: 32.2 % (ref 14.0–49.7)
MCH: 30.3 pg (ref 25.1–34.0)
MCHC: 32.9 g/dL (ref 31.5–36.0)
MCV: 92.2 fL (ref 79.5–101.0)
MONO%: 9.3 % (ref 0.0–14.0)
NEUT%: 54.7 % (ref 38.4–76.8)
Platelets: 216 10*3/uL (ref 145–400)
lymph#: 1.9 10*3/uL (ref 0.9–3.3)

## 2011-11-27 LAB — COMPREHENSIVE METABOLIC PANEL (CC13)
Albumin: 3.7 g/dL (ref 3.5–5.0)
Calcium: 9.6 mg/dL (ref 8.4–10.4)
Total Protein: 6.8 g/dL (ref 6.4–8.3)

## 2011-11-30 ENCOUNTER — Telehealth: Payer: Self-pay | Admitting: Internal Medicine

## 2011-11-30 ENCOUNTER — Ambulatory Visit (HOSPITAL_BASED_OUTPATIENT_CLINIC_OR_DEPARTMENT_OTHER): Payer: Medicare Other | Admitting: Internal Medicine

## 2011-11-30 VITALS — BP 154/93 | HR 86 | Temp 97.7°F | Resp 20 | Wt 204.6 lb

## 2011-11-30 DIAGNOSIS — C3410 Malignant neoplasm of upper lobe, unspecified bronchus or lung: Secondary | ICD-10-CM

## 2011-11-30 DIAGNOSIS — C3490 Malignant neoplasm of unspecified part of unspecified bronchus or lung: Secondary | ICD-10-CM

## 2011-11-30 NOTE — Patient Instructions (Signed)
Your scan showed no evidence for disease recurrence. We'll continue with observation and repeat CT scan of the chest in one year.

## 2011-11-30 NOTE — Telephone Encounter (Signed)
appts made and printed for pt aom °

## 2011-11-30 NOTE — Progress Notes (Signed)
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  PRIOR THERAPY: Status post left upper lobectomy under the care of Dr. Edwyna Shell on 12/11/2004.  CURRENT THERAPY: Observation.  INTERVAL HISTORY: Alison Dunn 78 y.o. female returns to the clinic today for annual followup visit accompanied by her husband. The patient is feeling fine today with no specific complaints. She denied having any significant chest pain, shortness breath, cough or hemoptysis. The patient denied having any significant weight loss or night sweats. She has repeat CT scan of the chest performed recently and she is here for evaluation and discussion of her scan results.  MEDICAL HISTORY: Past Medical History  Diagnosis Date  . Cancer     lung ca  . Hypertension     ALLERGIES:  is allergic to codeine and vicodin.  MEDICATIONS:  No current outpatient prescriptions on file.    REVIEW OF SYSTEMS:  A comprehensive review of systems was negative.   PHYSICAL EXAMINATION: General appearance: alert, cooperative and no distress Head: Normocephalic, without obvious abnormality, atraumatic Lymph nodes: Cervical, supraclavicular, and axillary nodes normal. Resp: clear to auscultation bilaterally Cardio: regular rate and rhythm, S1, S2 normal, no murmur, click, rub or gallop GI: soft, non-tender; bowel sounds normal; no masses,  no organomegaly Extremities: extremities normal, atraumatic, no cyanosis or edema  ECOG PERFORMANCE STATUS: 1 - Symptomatic but completely ambulatory  Blood pressure 154/93, pulse 86, temperature 97.7 F (36.5 C), temperature source Oral, resp. rate 20, weight 204 lb 9.6 oz (92.806 kg).  LABORATORY DATA: Lab Results  Component Value Date   WBC 5.8 11/27/2011   HGB 13.0 11/27/2011   HCT 39.7  11/27/2011   MCV 92.2 11/27/2011   PLT 216 11/27/2011      Chemistry      Component Value Date/Time   NA 141 11/27/2011 0928   NA 145 11/24/2010 0919   NA 141 04/30/2010 0507   K 4.2 11/27/2011 0928   K 4.3 11/24/2010 0919   K 4.3 04/30/2010 0507   CL 103 11/27/2011 0928   CL 101 11/24/2010 0919   CL 108 04/30/2010 0507   CO2 28 11/27/2011 0928   CO2 28 11/24/2010 0919   CO2 28 04/30/2010 0507   BUN 20.0 11/27/2011 0928   BUN 23* 11/24/2010 0919   BUN 14 04/30/2010 0507   CREATININE 0.9 11/27/2011 0928   CREATININE 1.1 11/24/2010 0919   CREATININE 1.02 04/30/2010 0507      Component Value Date/Time   CALCIUM 9.6 11/27/2011 0928   CALCIUM 9.2 11/24/2010 0919   CALCIUM 8.7 04/30/2010 0507   ALKPHOS 64 11/27/2011 0928   ALKPHOS 61 11/24/2010 0919   ALKPHOS 57 04/28/2010 0610   AST 20 11/27/2011 0928   AST 24 11/24/2010 0919   AST 20 04/28/2010 0610   ALT 17 11/27/2011 0928   ALT 13 04/28/2010 0610   BILITOT 0.50 11/27/2011 0928   BILITOT 0.50 11/24/2010 0919   BILITOT 0.5 04/28/2010 0610       RADIOGRAPHIC STUDIES: Ct Chest Wo Contrast  11/27/2011  *RADIOLOGY REPORT*  Clinical Data: History of lung cancer  CT CHEST WITHOUT CONTRAST  Technique:  Multidetector CT imaging of the chest was performed following the standard protocol without IV contrast.  Comparison: 11/24/2010  Findings:  No axillary or supraclavicular adenopathy.  No mediastinal or  hilar adenopathy.  Large hiatal hernia identified.  No pericardial or pleural effusion.  Postsurgical change from previous left upper lobectomy noted.  Left lung nodule measures 5.2 mm, image 28.  Unchanged from previous exam.  New scar like density noted in the right upper lobe, image 18.  Review of the visualized osseous structures is significant for mild thoracic spondylosis.  There are no aggressive lytic or sclerotic bone lesions identified.  Limited imaging through the upper abdomen is unremarkable.  IMPRESSION:  1.  Stable CT of the chest. 2.  No specific features  identified to suggest residual or recurrence of tumor. 3.  Stable nodule in the left midlung.   Original Report Authenticated By: Rosealee Albee, M.D.     ASSESSMENT: This is a very pleasant 78 years old white female with history of stage IB non-small cell lung cancer status post left upper lobectomy in October of 2006 and has been observation since that time was no evidence for disease recurrence.  PLAN: I discussed the scan results with the patient and her husband. I recommended for her to continue on observation with repeat CT scan of the chest in one year. She would come back for followup visit at that time. She was advised to call me immediately if she has any concerning symptoms in the interval.  All questions were answered. The patient knows to call the clinic with any problems, questions or concerns. We can certainly see the patient much sooner if necessary.

## 2011-12-01 ENCOUNTER — Other Ambulatory Visit: Payer: Self-pay | Admitting: Geriatric Medicine

## 2011-12-01 DIAGNOSIS — Z1231 Encounter for screening mammogram for malignant neoplasm of breast: Secondary | ICD-10-CM

## 2011-12-02 DIAGNOSIS — Z23 Encounter for immunization: Secondary | ICD-10-CM | POA: Diagnosis not present

## 2011-12-11 ENCOUNTER — Other Ambulatory Visit (HOSPITAL_COMMUNITY): Payer: 59

## 2011-12-15 DIAGNOSIS — I2699 Other pulmonary embolism without acute cor pulmonale: Secondary | ICD-10-CM | POA: Diagnosis not present

## 2011-12-23 DIAGNOSIS — I129 Hypertensive chronic kidney disease with stage 1 through stage 4 chronic kidney disease, or unspecified chronic kidney disease: Secondary | ICD-10-CM | POA: Diagnosis not present

## 2011-12-23 DIAGNOSIS — Z0000 Encounter for general adult medical examination without abnormal findings: Secondary | ICD-10-CM | POA: Diagnosis not present

## 2011-12-23 DIAGNOSIS — F411 Generalized anxiety disorder: Secondary | ICD-10-CM | POA: Diagnosis not present

## 2011-12-23 DIAGNOSIS — Z1331 Encounter for screening for depression: Secondary | ICD-10-CM | POA: Diagnosis not present

## 2011-12-23 DIAGNOSIS — K219 Gastro-esophageal reflux disease without esophagitis: Secondary | ICD-10-CM | POA: Diagnosis not present

## 2011-12-23 DIAGNOSIS — Z23 Encounter for immunization: Secondary | ICD-10-CM | POA: Diagnosis not present

## 2012-01-12 DIAGNOSIS — I2699 Other pulmonary embolism without acute cor pulmonale: Secondary | ICD-10-CM | POA: Diagnosis not present

## 2012-01-15 ENCOUNTER — Ambulatory Visit
Admission: RE | Admit: 2012-01-15 | Discharge: 2012-01-15 | Disposition: A | Discharge status: Home / Self Care | Payer: Medicare Other | Source: Ambulatory Visit | Attending: Geriatric Medicine | Admitting: Geriatric Medicine

## 2012-01-15 DIAGNOSIS — Z1231 Encounter for screening mammogram for malignant neoplasm of breast: Secondary | ICD-10-CM | POA: Diagnosis not present

## 2012-02-02 DIAGNOSIS — H16009 Unspecified corneal ulcer, unspecified eye: Secondary | ICD-10-CM | POA: Diagnosis not present

## 2012-02-03 DIAGNOSIS — H1030 Unspecified acute conjunctivitis, unspecified eye: Secondary | ICD-10-CM | POA: Diagnosis not present

## 2012-02-03 DIAGNOSIS — H16019 Central corneal ulcer, unspecified eye: Secondary | ICD-10-CM | POA: Diagnosis not present

## 2012-02-06 DIAGNOSIS — H16019 Central corneal ulcer, unspecified eye: Secondary | ICD-10-CM | POA: Diagnosis not present

## 2012-02-10 DIAGNOSIS — H16019 Central corneal ulcer, unspecified eye: Secondary | ICD-10-CM | POA: Diagnosis not present

## 2012-02-10 DIAGNOSIS — I2699 Other pulmonary embolism without acute cor pulmonale: Secondary | ICD-10-CM | POA: Diagnosis not present

## 2012-02-11 DIAGNOSIS — H16009 Unspecified corneal ulcer, unspecified eye: Secondary | ICD-10-CM | POA: Diagnosis not present

## 2012-02-13 DIAGNOSIS — H16009 Unspecified corneal ulcer, unspecified eye: Secondary | ICD-10-CM | POA: Diagnosis not present

## 2012-02-19 DIAGNOSIS — H16009 Unspecified corneal ulcer, unspecified eye: Secondary | ICD-10-CM | POA: Diagnosis not present

## 2012-02-22 DIAGNOSIS — H16019 Central corneal ulcer, unspecified eye: Secondary | ICD-10-CM | POA: Diagnosis not present

## 2012-02-26 DIAGNOSIS — H1710 Central corneal opacity, unspecified eye: Secondary | ICD-10-CM | POA: Diagnosis not present

## 2012-03-02 DIAGNOSIS — H1710 Central corneal opacity, unspecified eye: Secondary | ICD-10-CM | POA: Diagnosis not present

## 2012-03-02 DIAGNOSIS — H16019 Central corneal ulcer, unspecified eye: Secondary | ICD-10-CM | POA: Diagnosis not present

## 2012-03-07 DIAGNOSIS — H16029 Ring corneal ulcer, unspecified eye: Secondary | ICD-10-CM | POA: Diagnosis not present

## 2012-03-08 DIAGNOSIS — I2699 Other pulmonary embolism without acute cor pulmonale: Secondary | ICD-10-CM | POA: Diagnosis not present

## 2012-03-10 DIAGNOSIS — H16019 Central corneal ulcer, unspecified eye: Secondary | ICD-10-CM | POA: Diagnosis not present

## 2012-03-16 DIAGNOSIS — H16019 Central corneal ulcer, unspecified eye: Secondary | ICD-10-CM | POA: Diagnosis not present

## 2012-03-21 DIAGNOSIS — H16019 Central corneal ulcer, unspecified eye: Secondary | ICD-10-CM | POA: Diagnosis not present

## 2012-03-24 DIAGNOSIS — H16009 Unspecified corneal ulcer, unspecified eye: Secondary | ICD-10-CM | POA: Diagnosis not present

## 2012-03-25 DIAGNOSIS — H16009 Unspecified corneal ulcer, unspecified eye: Secondary | ICD-10-CM | POA: Diagnosis not present

## 2012-03-28 DIAGNOSIS — H16019 Central corneal ulcer, unspecified eye: Secondary | ICD-10-CM | POA: Diagnosis not present

## 2012-03-30 DIAGNOSIS — H16019 Central corneal ulcer, unspecified eye: Secondary | ICD-10-CM | POA: Diagnosis not present

## 2012-03-31 DIAGNOSIS — I2699 Other pulmonary embolism without acute cor pulmonale: Secondary | ICD-10-CM | POA: Diagnosis not present

## 2012-04-01 DIAGNOSIS — H16009 Unspecified corneal ulcer, unspecified eye: Secondary | ICD-10-CM | POA: Diagnosis not present

## 2012-04-08 DIAGNOSIS — H16009 Unspecified corneal ulcer, unspecified eye: Secondary | ICD-10-CM | POA: Diagnosis not present

## 2012-04-08 DIAGNOSIS — H16019 Central corneal ulcer, unspecified eye: Secondary | ICD-10-CM | POA: Diagnosis not present

## 2012-04-08 DIAGNOSIS — H20059 Hypopyon, unspecified eye: Secondary | ICD-10-CM | POA: Diagnosis not present

## 2012-04-15 DIAGNOSIS — H16009 Unspecified corneal ulcer, unspecified eye: Secondary | ICD-10-CM | POA: Diagnosis not present

## 2012-04-16 IMAGING — CT CT ANGIO CHEST
2 of 6 series · 19 of 36 positions shown · IV contrast (APPLIED)
Comparison: 11/07/2009

CLINICAL DATA: Short of breath.  History of lung cancer.  History
of pulmonary embolism.

CT ANGIOGRAPHY CHEST WITH CONTRAST
TECHNIQUE: Multidetector CT imaging of the chest was performed
using the standard protocol during bolus administration of
intravenous contrast.  Multiplanar CT image reconstructions
including MIPs were obtained to evaluate the vascular anatomy.
Contrast:  100 ml Gmnipaque-1MM.

[Series 8: pulm embolism 1.0 b25f thins · axial · 0.60mm/px · z∈[-264,-32]mm · 18 of 258 slices shown]
[im 13/258  lung]
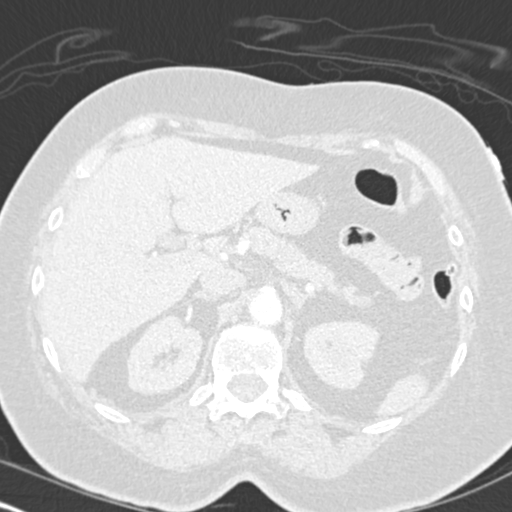
[im 26/258  mediastinal]
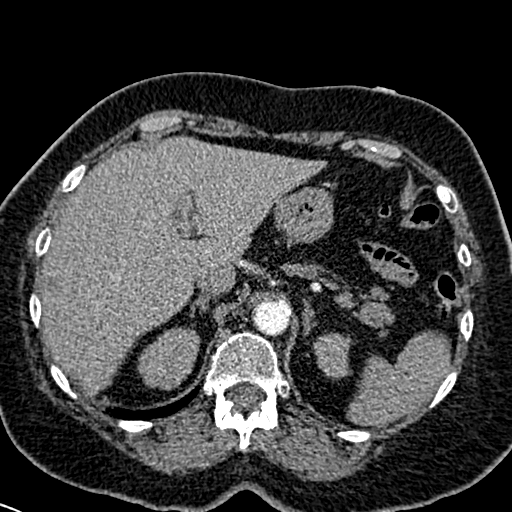
[im 39/258  lung]
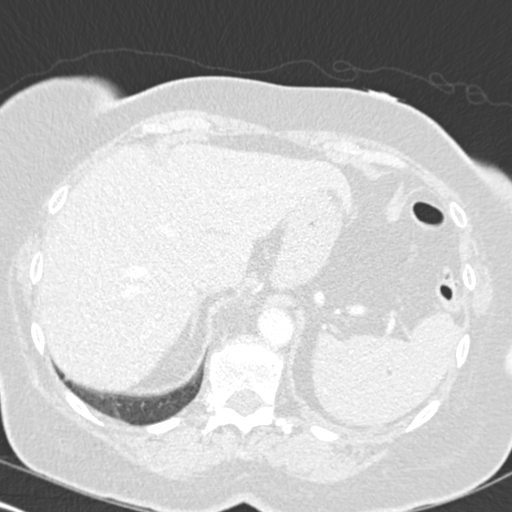
[im 52/258  mediastinal]
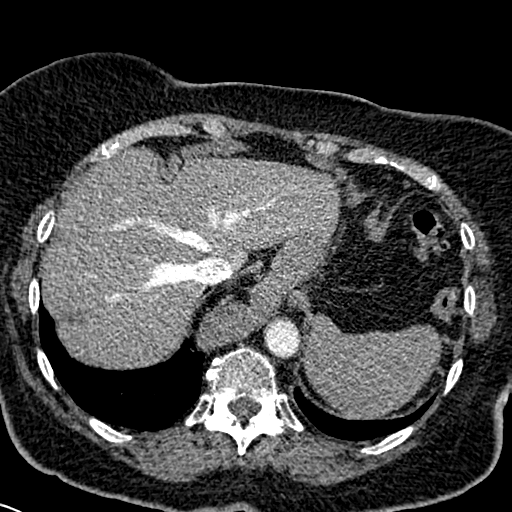
[im 65/258  lung]
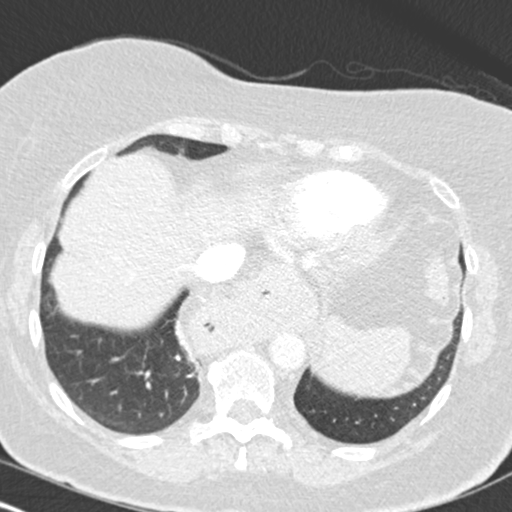
[im 78/258  mediastinal]
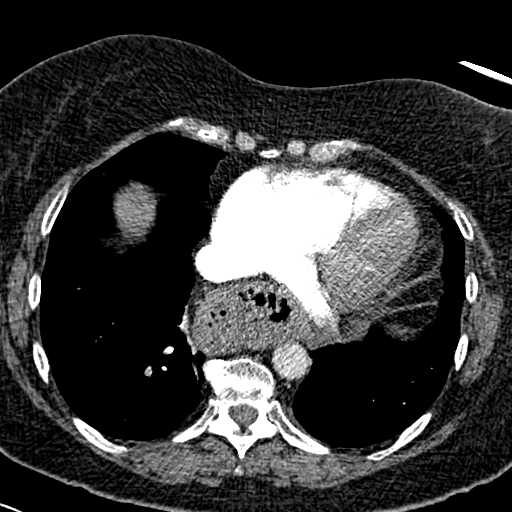
[im 90/258  lung]
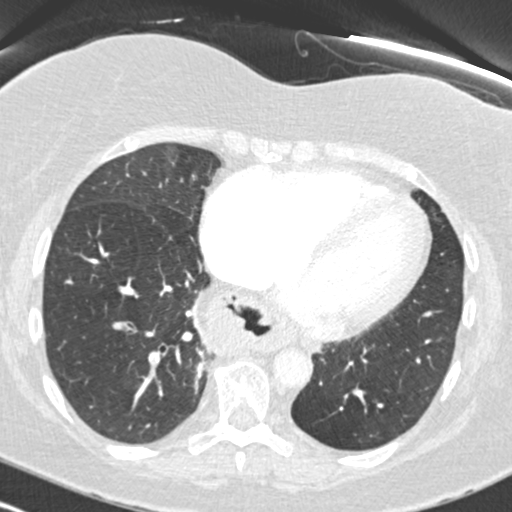
[im 103/258  mediastinal]
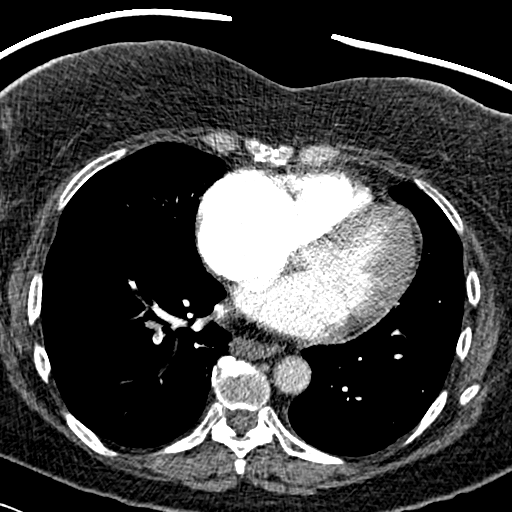
[im 116/258  lung]
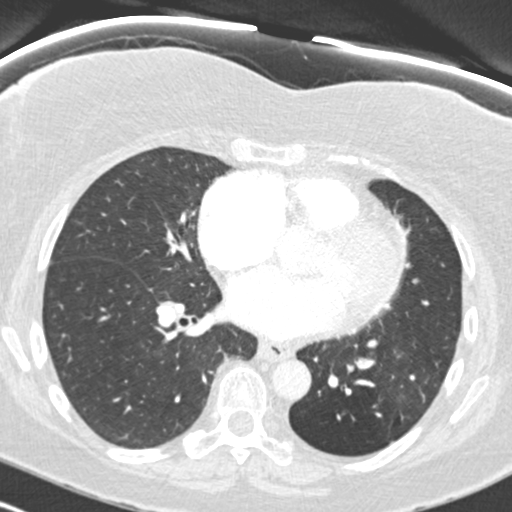
[im 142/258  mediastinal]
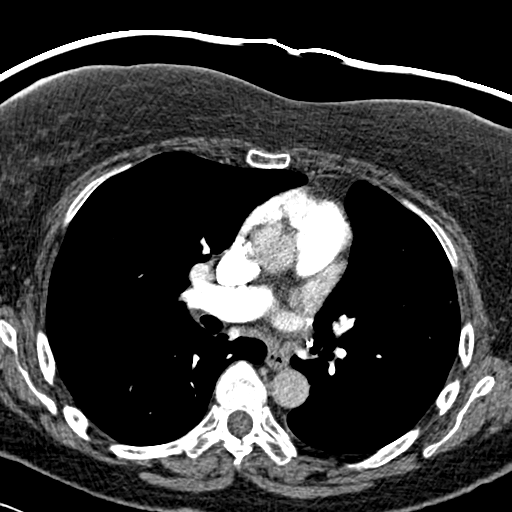
[im 155/258  lung]
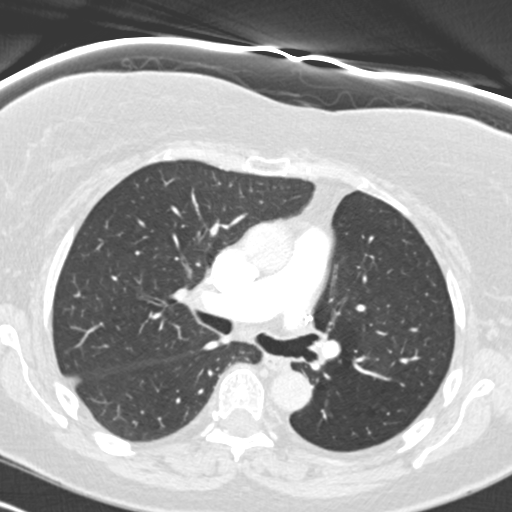
[im 168/258  mediastinal]
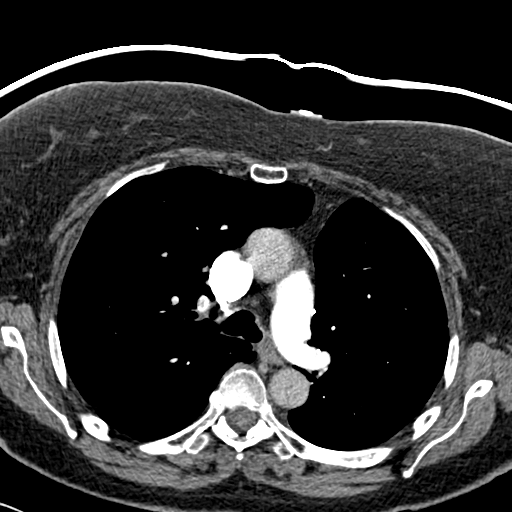
[im 180/258  lung]
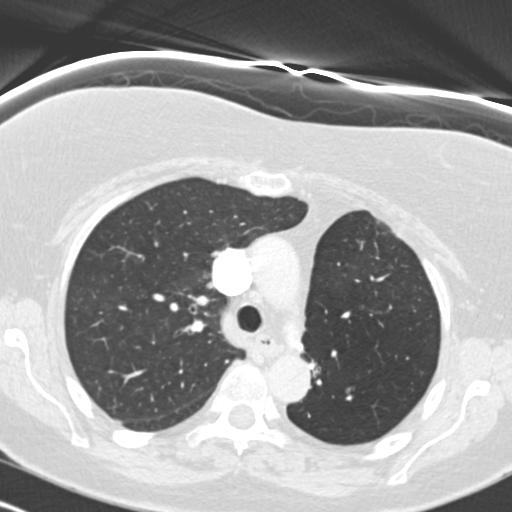
[im 193/258  mediastinal]
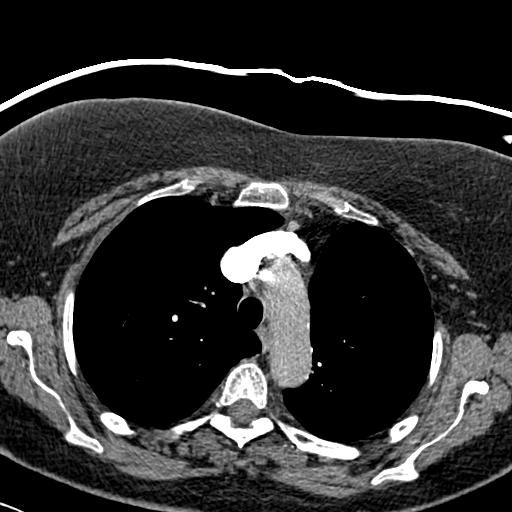
[im 206/258  lung]
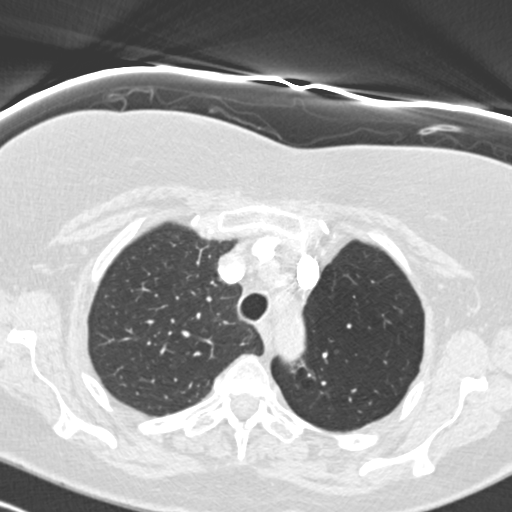
[im 219/258  mediastinal]
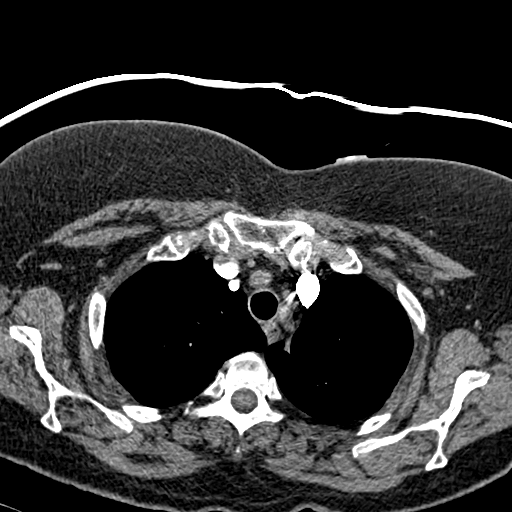
[im 232/258  lung]
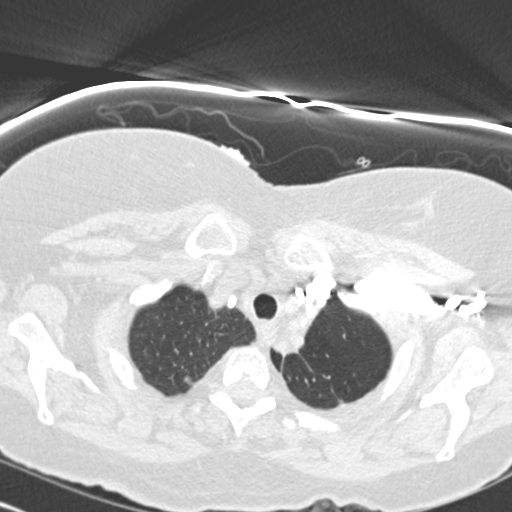
[im 245/258  mediastinal]
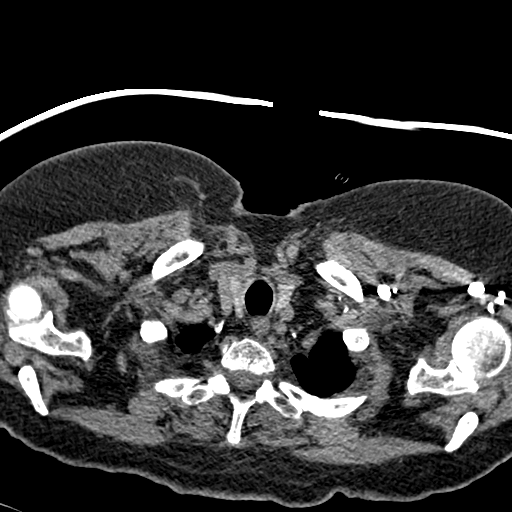

[Series 602: coronal mpr · coronal · 0.60mm/px · 1 of 107 slices shown]
[im 54/107  mediastinal]
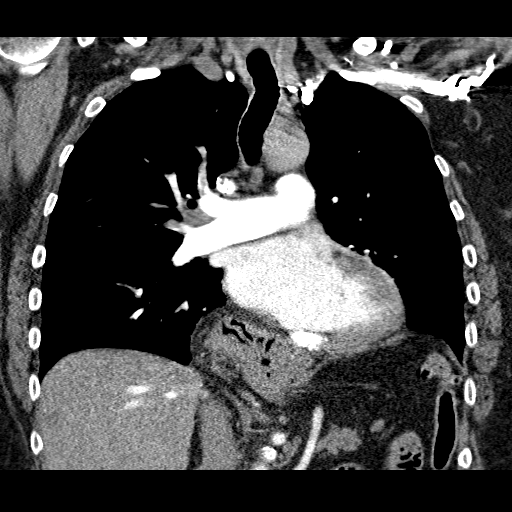

[19 of 36 positions shown; findings below may reference images not displayed]

FINDINGS: Technically adequate study which is positive for acute
pulmonary embolus to the right upper lobe, right middle lobe and
right lower lobe.  There is a tiny left lower lobe pulmonary
embolus identified on axial image number 47.

There is a large hiatal hernia.  There is no evidence of right
heart strain.  There is enlargement of the right heart which
appears chronic compared to the prior CT.  Incidental imaging of
the upper abdomen is unremarkable aside from the aortic and
visceral atherosclerosis.  There is no pericardial effusion.  No
pleural effusion.  The aorta grossly appears normal.  No axillary
adenopathy.  No mediastinal or hilar adenopathy.  Left upper
lobectomy.  Lungs demonstrate hyperexpansion on the left secondary
to lobectomy.  There is a 5 mm subpleural pulmonary nodule in the
left lower lobe on axial image number 39 that has been stable
dating back to 0553 consistent with a subpleural lymph node.
Thoracic spondylosis is present.

Review of the MIP images confirms the above findings.
IMPRESSION: 1.  Positive study for bilateral pulmonary emboli.  No right heart
strain.
2.  Left upper lobectomy.
3.  Large hiatal hernia.
4. Critical test results telephoned to Dr. Lisjona Nistor at the
time of interpretation on 04/27/2010 at 7318 hours.

## 2012-04-20 DIAGNOSIS — H16009 Unspecified corneal ulcer, unspecified eye: Secondary | ICD-10-CM | POA: Diagnosis not present

## 2012-04-25 DIAGNOSIS — H16069 Mycotic corneal ulcer, unspecified eye: Secondary | ICD-10-CM | POA: Diagnosis not present

## 2012-04-28 DIAGNOSIS — H16019 Central corneal ulcer, unspecified eye: Secondary | ICD-10-CM | POA: Diagnosis not present

## 2012-04-29 DIAGNOSIS — H16009 Unspecified corneal ulcer, unspecified eye: Secondary | ICD-10-CM | POA: Diagnosis not present

## 2012-04-29 DIAGNOSIS — H16069 Mycotic corneal ulcer, unspecified eye: Secondary | ICD-10-CM | POA: Diagnosis not present

## 2012-05-02 DIAGNOSIS — I2699 Other pulmonary embolism without acute cor pulmonale: Secondary | ICD-10-CM | POA: Diagnosis not present

## 2012-05-03 ENCOUNTER — Emergency Department (HOSPITAL_COMMUNITY)
Admission: EM | Admit: 2012-05-03 | Discharge: 2012-05-03 | Disposition: A | Discharge status: Home / Self Care | Payer: Medicare Other | Attending: Emergency Medicine | Admitting: Emergency Medicine

## 2012-05-03 ENCOUNTER — Encounter (HOSPITAL_COMMUNITY): Payer: Self-pay | Admitting: Emergency Medicine

## 2012-05-03 DIAGNOSIS — Z85118 Personal history of other malignant neoplasm of bronchus and lung: Secondary | ICD-10-CM | POA: Insufficient documentation

## 2012-05-03 DIAGNOSIS — D689 Coagulation defect, unspecified: Secondary | ICD-10-CM | POA: Diagnosis not present

## 2012-05-03 DIAGNOSIS — Z7901 Long term (current) use of anticoagulants: Secondary | ICD-10-CM | POA: Insufficient documentation

## 2012-05-03 DIAGNOSIS — Z86718 Personal history of other venous thrombosis and embolism: Secondary | ICD-10-CM | POA: Insufficient documentation

## 2012-05-03 DIAGNOSIS — R791 Abnormal coagulation profile: Secondary | ICD-10-CM | POA: Diagnosis not present

## 2012-05-03 DIAGNOSIS — Z86711 Personal history of pulmonary embolism: Secondary | ICD-10-CM | POA: Diagnosis not present

## 2012-05-03 DIAGNOSIS — I10 Essential (primary) hypertension: Secondary | ICD-10-CM | POA: Insufficient documentation

## 2012-05-03 LAB — APTT: aPTT: 90 seconds — ABNORMAL HIGH (ref 24–37)

## 2012-05-03 LAB — COMPREHENSIVE METABOLIC PANEL
ALT: 21 U/L (ref 0–35)
AST: 24 U/L (ref 0–37)
Albumin: 3.4 g/dL — ABNORMAL LOW (ref 3.5–5.2)
Alkaline Phosphatase: 64 U/L (ref 39–117)
BUN: 17 mg/dL (ref 6–23)
CO2: 27 mEq/L (ref 19–32)
Calcium: 9.3 mg/dL (ref 8.4–10.5)
Chloride: 99 mEq/L (ref 96–112)
Creatinine, Ser: 0.82 mg/dL (ref 0.50–1.10)
GFR calc Af Amer: 77 mL/min — ABNORMAL LOW (ref >90)
GFR calc non Af Amer: 67 mL/min — ABNORMAL LOW (ref >90)
Glucose, Bld: 123 mg/dL — ABNORMAL HIGH (ref 70–99)
Potassium: 3.8 mEq/L (ref 3.5–5.1)
Sodium: 135 mEq/L (ref 135–145)
Total Bilirubin: 0.3 mg/dL (ref 0.3–1.2)
Total Protein: 7 g/dL (ref 6.0–8.3)

## 2012-05-03 LAB — CBC WITH DIFFERENTIAL/PLATELET
Basophils Absolute: 0 10*3/uL (ref 0.0–0.1)
Basophils Relative: 1 % (ref 0–1)
Eosinophils Relative: 2 % (ref 0–5)
HCT: 39.9 % (ref 36.0–46.0)
Hemoglobin: 13.4 g/dL (ref 12.0–15.0)
MCHC: 33.6 g/dL (ref 30.0–36.0)
MCV: 88.7 fL (ref 78.0–100.0)
Monocytes Absolute: 0.7 10*3/uL (ref 0.1–1.0)
Monocytes Relative: 9 % (ref 3–12)
Neutro Abs: 4.2 10*3/uL (ref 1.7–7.7)
RDW: 16.4 % — ABNORMAL HIGH (ref 11.5–15.5)

## 2012-05-03 LAB — PROTIME-INR
INR: >10 (ref 0.00–1.49)
Prothrombin Time: >90 seconds — ABNORMAL HIGH (ref 11.6–15.2)

## 2012-05-03 MED ORDER — SODIUM CHLORIDE 0.9 % IV SOLN
Freq: Once | INTRAVENOUS | Status: AC
  Administered 2012-05-03: 08:00:00 via INTRAVENOUS

## 2012-05-03 MED ORDER — VITAMIN K1 10 MG/ML IJ SOLN
10.0000 mg | INTRAMUSCULAR | Status: AC
  Administered 2012-05-03: 10 mg via INTRAVENOUS
  Filled 2012-05-03: qty 1

## 2012-05-03 NOTE — ED Notes (Signed)
Pt from home, states PCP called her this morning,PT/INR too high. Pt c/o headache last night.

## 2012-05-03 NOTE — ED Provider Notes (Signed)
Medical screening examination/treatment/procedure(s) were conducted as a shared visit with non-physician practitioner(s) and myself.  I personally evaluated the patient during the encounter Pt with elevated INR but no evidence of bleeding.  Rx IV Vit K 10 mg, F/U with Dr. Pete Glatter, her internist, in his office tomorrow.  Carleene Cooper III, MD 05/03/12 [phone]

## 2012-05-03 NOTE — ED Provider Notes (Signed)
History     CSN: [id number]  Arrival date & time 05/03/12  0710   First MD Initiated Contact with Patient 05/03/12 0719      Chief Complaint  Patient presents with  . Elevated INR     (Consider location/radiation/quality/duration/timing/severity/associated sxs/prior treatment) HPI Comments: She was contacted by her physician's office ([facility]) and was told she had a high PT that needed emergent treatment. 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 She was recently treated for an eye condition that required stopping her Coumadin for one week, restarting one week ago. She states that she was restarted at a higher dose than what she had been taking going from taking 5mg  and 2.5 mg on alternating days to taking 5 mg everyday. She is also taking a new antifungal medication in treatment of the eye condition. She denies any symptoms currently. No melena or bleeding with bowel movements, no headache, SOB, abdominal pain, N or V.  The history is provided by the patient.    Past Medical History  Diagnosis Date  . Cancer     lung ca  . Hypertension     Past Surgical History  Procedure Laterality Date  . Knee surgery    . Hip surgery    . Lung removal, partial      Left lower lobe removed     No family history on file.  History  Substance Use Topics  . Smoking status: Never Smoker   . Smokeless tobacco: Never Used  . Alcohol Use: No    OB History   Grav Para Term Preterm Abortions TAB SAB Ect Mult Living                  Review of Systems  Constitutional: Negative for fever.  Eyes:       She has chronic changes in left eye of redness and discomfort that are unchanged from previous condition.  Respiratory: Negative for shortness of breath.   Cardiovascular: Negative for chest pain.  Gastrointestinal: Negative for abdominal pain and blood in stool.  Genitourinary: Negative for dysuria and hematuria.  Musculoskeletal: Negative for myalgias.  Neurological: Negative  for syncope, weakness and light-headedness.  Psychiatric/Behavioral: Negative for confusion.    Allergies  Codeine and Vicodin  Home Medications  No current outpatient prescriptions on file.  BP 181/99  Pulse 85  Temp(Src) 98.6 F (37 C) (Oral)  Resp 16  SpO2 100%  Physical Exam  Constitutional: She is oriented to person, place, and time. She appears well-developed and well-nourished. No distress.  HENT:  Head: Normocephalic.  Eyes: Conjunctivae are normal.  Left eye is injected with chronic pupillary abnormality.  Neck: Normal range of motion.  Cardiovascular: Normal rate and regular rhythm.   No murmur heard. Pulmonary/Chest: Effort normal. She has no wheezes. She has no rales.  Abdominal: Soft. There is no tenderness.  Musculoskeletal: Normal range of motion. She exhibits no edema.  Neurological: She is alert and oriented to person, place, and time.  Skin: Skin is warm and dry.    ED Course  Procedures (including critical care time)  Labs Reviewed  CBC WITH DIFFERENTIAL  PROTIME-INR  APTT  URINALYSIS, ROUTINE W REFLEX MICROSCOPIC  COMPREHENSIVE METABOLIC PANEL   Results for orders placed during the hospital encounter of 05/03/12  CBC WITH DIFFERENTIAL      Result Value Range   WBC 7.2  4.0 - 10.5 K/uL   RBC 4.50  3.87 - 5.11 MIL/uL   Hemoglobin 13.4  12.0 - 15.0 g/dL   HCT 54.0  98.1 - 19.1 %   MCV 88.7  78.0 - 100.0 fL   MCH 29.8  26.0 - 34.0 pg   MCHC 33.6  30.0 - 36.0 g/dL   RDW 47.8 (*) 29.5 - 62.1 %   Platelets 235  150 - 400 K/uL   Neutrophils Relative 58  43 - 77 %   Neutro Abs 4.2  1.7 - 7.7 K/uL   Lymphocytes Relative 30  12 - 46 %   Lymphs Abs 2.1  0.7 - 4.0 K/uL   Monocytes Relative 9  3 - 12 %   Monocytes Absolute 0.7  0.1 - 1.0 K/uL   Eosinophils Relative 2  0 - 5 %   Eosinophils Absolute 0.2  0.0 - 0.7 K/uL   Basophils Relative 1  0 - 1 %   Basophils Absolute 0.0  0.0 - 0.1 K/uL  PROTIME-INR      Result Value Range   Prothrombin Time  >90.0 (*) 11.6 - 15.2 seconds   INR >10.00 (*) 0.00 - 1.49  APTT      Result Value Range   aPTT 90 (*) 24 - 37 seconds  COMPREHENSIVE METABOLIC PANEL      Result Value Range   Sodium 135  135 - 145 mEq/L   Potassium 3.8  3.5 - 5.1 mEq/L   Chloride 99  96 - 112 mEq/L   CO2 27  19 - 32 mEq/L   Glucose, Bld 123 (*) 70 - 99 mg/dL   BUN 17  6 - 23 mg/dL   Creatinine, Ser 3.08  0.50 - 1.10 mg/dL   Calcium 9.3  8.4 - 65.7 mg/dL   Total Protein 7.0  6.0 - 8.3 g/dL   Albumin 3.4 (*) 3.5 - 5.2 g/dL   AST 24  0 - 37 U/L   ALT 21  0 - 35 U/L   Alkaline Phosphatase 64  39 - 117 U/L   Total Bilirubin 0.3  0.3 - 1.2 mg/dL   GFR calc non Af Amer 67 (*) >90 mL/min   GFR calc Af Amer 77 (*) >90 mL/min    No results found.   No diagnosis found.  1. coaglopathy secondary to Coumadin   MDM  The patient's INR is >10 without evidence of any bleeding and a hgb of 13.5. IV vitamin K given. Discussed with Dr. Pete Glatter who is comfortable with discharging the patient and follow up in office tomorrow for recheck        Arnoldo Hooker, PA-C 05/03/12 1012

## 2012-05-04 DIAGNOSIS — H16009 Unspecified corneal ulcer, unspecified eye: Secondary | ICD-10-CM | POA: Diagnosis not present

## 2012-05-04 DIAGNOSIS — Z7901 Long term (current) use of anticoagulants: Secondary | ICD-10-CM | POA: Diagnosis not present

## 2012-05-06 DIAGNOSIS — Z7901 Long term (current) use of anticoagulants: Secondary | ICD-10-CM | POA: Diagnosis not present

## 2012-05-07 DIAGNOSIS — H16019 Central corneal ulcer, unspecified eye: Secondary | ICD-10-CM | POA: Diagnosis not present

## 2012-05-09 DIAGNOSIS — Z7901 Long term (current) use of anticoagulants: Secondary | ICD-10-CM | POA: Diagnosis not present

## 2012-05-11 DIAGNOSIS — Z7901 Long term (current) use of anticoagulants: Secondary | ICD-10-CM | POA: Diagnosis not present

## 2012-05-11 DIAGNOSIS — H16009 Unspecified corneal ulcer, unspecified eye: Secondary | ICD-10-CM | POA: Diagnosis not present

## 2012-05-13 DIAGNOSIS — Z7901 Long term (current) use of anticoagulants: Secondary | ICD-10-CM | POA: Diagnosis not present

## 2012-05-13 DIAGNOSIS — H16009 Unspecified corneal ulcer, unspecified eye: Secondary | ICD-10-CM | POA: Diagnosis not present

## 2012-05-18 DIAGNOSIS — Z7901 Long term (current) use of anticoagulants: Secondary | ICD-10-CM | POA: Diagnosis not present

## 2012-05-18 DIAGNOSIS — H16009 Unspecified corneal ulcer, unspecified eye: Secondary | ICD-10-CM | POA: Diagnosis not present

## 2012-05-24 DIAGNOSIS — H16009 Unspecified corneal ulcer, unspecified eye: Secondary | ICD-10-CM | POA: Diagnosis not present

## 2012-05-25 DIAGNOSIS — Z7901 Long term (current) use of anticoagulants: Secondary | ICD-10-CM | POA: Diagnosis not present

## 2012-05-27 DIAGNOSIS — Z7901 Long term (current) use of anticoagulants: Secondary | ICD-10-CM | POA: Diagnosis not present

## 2012-06-01 DIAGNOSIS — H16009 Unspecified corneal ulcer, unspecified eye: Secondary | ICD-10-CM | POA: Diagnosis not present

## 2012-06-02 DIAGNOSIS — Z7901 Long term (current) use of anticoagulants: Secondary | ICD-10-CM | POA: Diagnosis not present

## 2012-06-07 DIAGNOSIS — Z7901 Long term (current) use of anticoagulants: Secondary | ICD-10-CM | POA: Diagnosis not present

## 2012-06-17 DIAGNOSIS — H16009 Unspecified corneal ulcer, unspecified eye: Secondary | ICD-10-CM | POA: Diagnosis not present

## 2012-06-22 DIAGNOSIS — I10 Essential (primary) hypertension: Secondary | ICD-10-CM | POA: Diagnosis not present

## 2012-06-22 DIAGNOSIS — L821 Other seborrheic keratosis: Secondary | ICD-10-CM | POA: Diagnosis not present

## 2012-06-22 DIAGNOSIS — Z7901 Long term (current) use of anticoagulants: Secondary | ICD-10-CM | POA: Diagnosis not present

## 2012-06-22 DIAGNOSIS — Z79899 Other long term (current) drug therapy: Secondary | ICD-10-CM | POA: Diagnosis not present

## 2012-06-22 DIAGNOSIS — D509 Iron deficiency anemia, unspecified: Secondary | ICD-10-CM | POA: Diagnosis not present

## 2012-06-22 DIAGNOSIS — I2699 Other pulmonary embolism without acute cor pulmonale: Secondary | ICD-10-CM | POA: Diagnosis not present

## 2012-06-22 DIAGNOSIS — E669 Obesity, unspecified: Secondary | ICD-10-CM | POA: Diagnosis not present

## 2012-07-06 DIAGNOSIS — Z79899 Other long term (current) drug therapy: Secondary | ICD-10-CM | POA: Diagnosis not present

## 2012-07-06 DIAGNOSIS — H16009 Unspecified corneal ulcer, unspecified eye: Secondary | ICD-10-CM | POA: Diagnosis not present

## 2012-07-06 DIAGNOSIS — Z7901 Long term (current) use of anticoagulants: Secondary | ICD-10-CM | POA: Diagnosis not present

## 2012-07-06 DIAGNOSIS — I10 Essential (primary) hypertension: Secondary | ICD-10-CM | POA: Diagnosis not present

## 2012-08-03 DIAGNOSIS — H16009 Unspecified corneal ulcer, unspecified eye: Secondary | ICD-10-CM | POA: Diagnosis not present

## 2012-08-03 DIAGNOSIS — Z7901 Long term (current) use of anticoagulants: Secondary | ICD-10-CM | POA: Diagnosis not present

## 2012-09-06 DIAGNOSIS — Z7901 Long term (current) use of anticoagulants: Secondary | ICD-10-CM | POA: Diagnosis not present

## 2012-10-07 DIAGNOSIS — Z7901 Long term (current) use of anticoagulants: Secondary | ICD-10-CM | POA: Diagnosis not present

## 2012-11-02 DIAGNOSIS — Z7901 Long term (current) use of anticoagulants: Secondary | ICD-10-CM | POA: Diagnosis not present

## 2012-11-02 DIAGNOSIS — H16009 Unspecified corneal ulcer, unspecified eye: Secondary | ICD-10-CM | POA: Diagnosis not present

## 2012-11-02 DIAGNOSIS — H2510 Age-related nuclear cataract, unspecified eye: Secondary | ICD-10-CM | POA: Diagnosis not present

## 2012-11-25 ENCOUNTER — Other Ambulatory Visit (HOSPITAL_BASED_OUTPATIENT_CLINIC_OR_DEPARTMENT_OTHER): Payer: Medicare Other | Admitting: Lab

## 2012-11-25 ENCOUNTER — Ambulatory Visit (HOSPITAL_COMMUNITY)
Admission: RE | Admit: 2012-11-25 | Discharge: 2012-11-25 | Disposition: A | Discharge status: Home / Self Care | Payer: Medicare Other | Source: Ambulatory Visit | Attending: Internal Medicine | Admitting: Internal Medicine

## 2012-11-25 DIAGNOSIS — C3490 Malignant neoplasm of unspecified part of unspecified bronchus or lung: Secondary | ICD-10-CM | POA: Diagnosis not present

## 2012-11-25 DIAGNOSIS — R911 Solitary pulmonary nodule: Secondary | ICD-10-CM | POA: Insufficient documentation

## 2012-11-25 DIAGNOSIS — C3410 Malignant neoplasm of upper lobe, unspecified bronchus or lung: Secondary | ICD-10-CM | POA: Diagnosis not present

## 2012-11-25 LAB — COMPREHENSIVE METABOLIC PANEL (CC13)
ALT: 16 U/L (ref 0–55)
AST: 20 U/L (ref 5–34)
Albumin: 3.4 g/dL — ABNORMAL LOW (ref 3.5–5.0)
Alkaline Phosphatase: 65 U/L (ref 40–150)
BUN: 23 mg/dL (ref 7.0–26.0)
Chloride: 103 mEq/L (ref 98–109)
Potassium: 4 mEq/L (ref 3.5–5.1)
Sodium: 142 mEq/L (ref 136–145)
Total Protein: 7 g/dL (ref 6.4–8.3)

## 2012-11-25 LAB — CBC WITH DIFFERENTIAL/PLATELET
BASO%: 0.9 % (ref 0.0–2.0)
EOS%: 3.6 % (ref 0.0–7.0)
MCH: 28.5 pg (ref 25.1–34.0)
MCV: 88 fL (ref 79.5–101.0)
MONO%: 9.6 % (ref 0.0–14.0)
RBC: 4.34 10*6/uL (ref 3.70–5.45)
RDW: 14.4 % (ref 11.2–14.5)
lymph#: 1.9 10*3/uL (ref 0.9–3.3)

## 2012-11-28 ENCOUNTER — Ambulatory Visit (HOSPITAL_BASED_OUTPATIENT_CLINIC_OR_DEPARTMENT_OTHER): Payer: Medicare Other | Admitting: Internal Medicine

## 2012-11-28 ENCOUNTER — Encounter: Payer: Self-pay | Admitting: Internal Medicine

## 2012-11-28 ENCOUNTER — Telehealth: Payer: Self-pay | Admitting: Internal Medicine

## 2012-11-28 VITALS — BP 180/78 | HR 71 | Temp 97.8°F | Resp 18 | Ht 65.0 in | Wt 205.7 lb

## 2012-11-28 DIAGNOSIS — H16069 Mycotic corneal ulcer, unspecified eye: Secondary | ICD-10-CM | POA: Diagnosis not present

## 2012-11-28 DIAGNOSIS — C3492 Malignant neoplasm of unspecified part of left bronchus or lung: Secondary | ICD-10-CM

## 2012-11-28 DIAGNOSIS — Z7901 Long term (current) use of anticoagulants: Secondary | ICD-10-CM | POA: Diagnosis not present

## 2012-11-28 DIAGNOSIS — Z85118 Personal history of other malignant neoplasm of bronchus and lung: Secondary | ICD-10-CM | POA: Diagnosis not present

## 2012-11-28 DIAGNOSIS — H16009 Unspecified corneal ulcer, unspecified eye: Secondary | ICD-10-CM | POA: Diagnosis not present

## 2012-11-28 DIAGNOSIS — H2510 Age-related nuclear cataract, unspecified eye: Secondary | ICD-10-CM | POA: Diagnosis not present

## 2012-11-28 DIAGNOSIS — I498 Other specified cardiac arrhythmias: Secondary | ICD-10-CM | POA: Diagnosis not present

## 2012-11-28 NOTE — Patient Instructions (Signed)
Followup visit in 3 months with repeat CT scan of the chest. 

## 2012-11-28 NOTE — Progress Notes (Signed)
[facility] Telephone:([phone]   Fax:([phone]) [phone]  OFFICE PROGRESS NOTE  Ginette Otto, MD 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   PRIOR THERAPY: Status post left upper lobectomy under the care of Dr. Edwyna Shell on 12/11/2004.   CURRENT THERAPY: Observation  INTERVAL HISTORY: Alison Dunn 79 y.o. female returns to the clinic today for followup visit accompanied by her daughter. The patient is feeling fine today with no specific complaints. She denied having any significant chest pain, shortness breath, cough or hemoptysis. The patient denied having any nausea or vomiting. She denied having any significant weight loss or night sweats. 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 The patient had repeat CT scan of the chest performed recently and she is here for evaluation and discussion of her scan results.  MEDICAL HISTORY: Past Medical History  Diagnosis Date  . Cancer     lung ca  . Hypertension     ALLERGIES:  is allergic to codeine and vicodin.  MEDICATIONS:  Current Outpatient Prescriptions  Medication Sig Dispense Refill  . acetaminophen (TYLENOL) 500 MG tablet Take 500 mg by mouth 2 (two) times daily.      . calcium carbonate (OS-CAL) 600 MG TABS Take 600 mg by mouth 2 (two) times daily with a meal.      . cholecalciferol (VITAMIN D) 1000 UNITS tablet Take 1,000 Units by mouth daily.      . hydrochlorothiazide (HYDRODIURIL) 25 MG tablet Take 25 mg by mouth daily.      . Multiple Vitamin (MULTIVITAMIN WITH MINERALS) TABS Take 1 tablet by mouth daily.      . psyllium (HYDROCIL/METAMUCIL) 95 % PACK Take 1 packet by mouth daily.      . RABEprazole (ACIPHEX) 20 MG tablet Take 20 mg by mouth daily.      . vitamin C  (ASCORBIC ACID) 500 MG tablet Take 500 mg by mouth daily.      Marland Kitchen warfarin (COUMADIN) 5 MG tablet Take by mouth daily. Take as directed by Dr Pete Glatter       No current facility-administered medications for this visit.    SURGICAL HISTORY:  Past Surgical History  Procedure Laterality Date  . Knee surgery    . Hip surgery    . Lung removal, partial      Left lower lobe removed     REVIEW OF SYSTEMS:  A comprehensive review of systems was negative.   PHYSICAL EXAMINATION: General appearance: alert, cooperative and no distress Head: Normocephalic, without obvious abnormality, atraumatic Neck: no adenopathy, no JVD, supple, symmetrical, trachea midline and thyroid not enlarged, symmetric, no tenderness/mass/nodules Lymph nodes: Cervical, supraclavicular, and axillary nodes normal. Resp: clear to auscultation bilaterally Back: symmetric, no curvature. ROM normal. No CVA tenderness. Cardio: regular rate and rhythm, S1, S2 normal, no murmur, click, rub or gallop GI: soft, non-tender; bowel sounds normal; no masses,  no organomegaly Extremities: extremities normal, atraumatic, no cyanosis or edema  ECOG PERFORMANCE STATUS: 1 - Symptomatic but completely ambulatory  Blood pressure 180/78, pulse 71, temperature 97.8 F (36.6 C), temperature source Oral, resp. rate 18, height 5\' 5"  (1.651 m), weight 205 lb 11.2 oz (93.305 kg), SpO2 100.00%.  LABORATORY DATA: Lab Results  Component Value Date  WBC 5.8 11/25/2012   HGB 12.4 11/25/2012   HCT 38.2 11/25/2012   MCV 88.0 11/25/2012   PLT 222 11/25/2012      Chemistry      Component Value Date/Time   NA 142 11/25/2012 0853   NA 135 05/03/2012 0744   NA 145 11/24/2010 0919   K 4.0 11/25/2012 0853   K 3.8 05/03/2012 0744   K 4.3 11/24/2010 0919   CL 99 05/03/2012 0744   CL 103 11/27/2011 0928   CL 101 11/24/2010 0919   CO2 30* 11/25/2012 0853   CO2 27 05/03/2012 0744   CO2 28 11/24/2010 0919   BUN 23.0 11/25/2012 0853   BUN 17 05/03/2012 0744    BUN 23* 11/24/2010 0919   CREATININE 1.1 11/25/2012 0853   CREATININE 0.82 05/03/2012 0744   CREATININE 1.1 11/24/2010 0919      Component Value Date/Time   CALCIUM 9.1 11/25/2012 0853   CALCIUM 9.3 05/03/2012 0744   CALCIUM 9.2 11/24/2010 0919   ALKPHOS 65 11/25/2012 0853   ALKPHOS 64 05/03/2012 0744   ALKPHOS 61 11/24/2010 0919   AST 20 11/25/2012 0853   AST 24 05/03/2012 0744   AST 24 11/24/2010 0919   ALT 16 11/25/2012 0853   ALT 21 05/03/2012 0744   ALT 20 11/24/2010 0919   BILITOT 0.32 11/25/2012 0853   BILITOT 0.3 05/03/2012 0744   BILITOT 0.50 11/24/2010 0919       RADIOGRAPHIC STUDIES: Ct Chest Wo Contrast  11/25/2012   CLINICAL DATA:  Followup lung cancer  EXAM: CT CHEST WITHOUT CONTRAST  TECHNIQUE: Multidetector CT imaging of the chest was performed following the standard protocol without IV contrast.  COMPARISON:  11/27/2011  FINDINGS: No pleural effusion. 2 cm ground-glass attenuating nodule is identified, image 19/series 5. This is in the area of the previous scar like density. Posterior stress set postoperative change and volume loss is noted involving the left lung. Index nodule within the left upper lobe is stable measuring 5 mm, image 27/ series 5.  The heart size is normal. There is no pericardial effusion. No mediastinal or hilar adenopathy identified. Large hiatal hernia is identified. No axillary or supraclavicular adenopathy.  Limited imaging through the upper abdomen is unremarkable. No acute findings identified.  Review of the visualized osseous structures is remarkable for mild spondylosis. No aggressive lytic or sclerotic bone lesions identified.  IMPRESSION: 1.  No acute findings.  2. New ground-glass attenuating nodule within the right upper lobe measures 2 cm. Initial followup examination in 3 months is advised to confirm persistence. If persistent then annual followup for minimum of 3 years would be advised. This recommendation follows the consensus statement: Recommendations  for the Management of Subsolid Pulmonary Nodules Detected at CT: A Statement from the Fleischner Society. Radiology 2013;266:304-317.  3. Stable 5 mm solid nodule in the left upper lobe.   Electronically Signed   By: Signa Kell M.D.   On: 11/25/2012 09:23    ASSESSMENT AND PLAN: This is a very pleasant 79 years old white female with history of a stage IB non-small cell lung cancer status post left upper lobectomy in October of 2006 and has been observation since that time was no evidence for disease recurrence. Recent scan showed new groundglass opacity within the right upper lobe measuring 2 CM in size. I discussed the scan results and showed the images to the patient and her daughter. I recommended for her to have repeat CT scan of the chest in  3 months for reevaluation of this lesion. The patient agreed to the current plan. She was advised to call immediately if she has any concerning symptoms in the interval. The patient voices understanding of current disease status and treatment options and is in agreement with the current care plan.  All questions were answered. The patient knows to call the clinic with any problems, questions or concerns. We can certainly see the patient much sooner if necessary.

## 2012-11-28 NOTE — Telephone Encounter (Signed)
gv adn printed appt sched and avs for pt for Jan 2015 °

## 2012-11-29 DIAGNOSIS — Z23 Encounter for immunization: Secondary | ICD-10-CM | POA: Diagnosis not present

## 2012-12-08 DIAGNOSIS — H179 Unspecified corneal scar and opacity: Secondary | ICD-10-CM | POA: Diagnosis not present

## 2012-12-08 DIAGNOSIS — I10 Essential (primary) hypertension: Secondary | ICD-10-CM | POA: Diagnosis not present

## 2012-12-08 DIAGNOSIS — H16069 Mycotic corneal ulcer, unspecified eye: Secondary | ICD-10-CM | POA: Diagnosis not present

## 2012-12-08 DIAGNOSIS — K219 Gastro-esophageal reflux disease without esophagitis: Secondary | ICD-10-CM | POA: Diagnosis not present

## 2012-12-08 DIAGNOSIS — Z9889 Other specified postprocedural states: Secondary | ICD-10-CM | POA: Diagnosis not present

## 2012-12-08 DIAGNOSIS — H2510 Age-related nuclear cataract, unspecified eye: Secondary | ICD-10-CM | POA: Diagnosis not present

## 2012-12-08 DIAGNOSIS — Z886 Allergy status to analgesic agent status: Secondary | ICD-10-CM | POA: Diagnosis not present

## 2012-12-08 DIAGNOSIS — H21549 Posterior synechiae (iris), unspecified eye: Secondary | ICD-10-CM | POA: Diagnosis not present

## 2012-12-08 DIAGNOSIS — Z86711 Personal history of pulmonary embolism: Secondary | ICD-10-CM | POA: Diagnosis not present

## 2012-12-08 DIAGNOSIS — Z8673 Personal history of transient ischemic attack (TIA), and cerebral infarction without residual deficits: Secondary | ICD-10-CM | POA: Diagnosis not present

## 2012-12-09 DIAGNOSIS — Z947 Corneal transplant status: Secondary | ICD-10-CM | POA: Diagnosis not present

## 2012-12-09 DIAGNOSIS — Z9849 Cataract extraction status, unspecified eye: Secondary | ICD-10-CM | POA: Diagnosis not present

## 2012-12-09 DIAGNOSIS — Z48298 Encounter for aftercare following other organ transplant: Secondary | ICD-10-CM | POA: Diagnosis not present

## 2012-12-16 DIAGNOSIS — I2699 Other pulmonary embolism without acute cor pulmonale: Secondary | ICD-10-CM | POA: Diagnosis not present

## 2012-12-16 DIAGNOSIS — Z7901 Long term (current) use of anticoagulants: Secondary | ICD-10-CM | POA: Diagnosis not present

## 2012-12-29 ENCOUNTER — Other Ambulatory Visit: Payer: Self-pay

## 2012-12-29 DIAGNOSIS — Z1231 Encounter for screening mammogram for malignant neoplasm of breast: Secondary | ICD-10-CM

## 2012-12-30 DIAGNOSIS — I2699 Other pulmonary embolism without acute cor pulmonale: Secondary | ICD-10-CM | POA: Diagnosis not present

## 2012-12-30 DIAGNOSIS — Z7901 Long term (current) use of anticoagulants: Secondary | ICD-10-CM | POA: Diagnosis not present

## 2013-01-27 DIAGNOSIS — Z0000 Encounter for general adult medical examination without abnormal findings: Secondary | ICD-10-CM | POA: Diagnosis not present

## 2013-01-27 DIAGNOSIS — Z79899 Other long term (current) drug therapy: Secondary | ICD-10-CM | POA: Diagnosis not present

## 2013-01-27 DIAGNOSIS — I10 Essential (primary) hypertension: Secondary | ICD-10-CM | POA: Diagnosis not present

## 2013-01-27 DIAGNOSIS — Z7901 Long term (current) use of anticoagulants: Secondary | ICD-10-CM | POA: Diagnosis not present

## 2013-01-27 DIAGNOSIS — Z1331 Encounter for screening for depression: Secondary | ICD-10-CM | POA: Diagnosis not present

## 2013-01-27 DIAGNOSIS — I2699 Other pulmonary embolism without acute cor pulmonale: Secondary | ICD-10-CM | POA: Diagnosis not present

## 2013-02-02 ENCOUNTER — Ambulatory Visit: Payer: Medicare Other

## 2013-02-20 ENCOUNTER — Ambulatory Visit
Admission: RE | Admit: 2013-02-20 | Discharge: 2013-02-20 | Disposition: A | Discharge status: Home / Self Care | Payer: Medicare Other | Source: Ambulatory Visit

## 2013-02-20 DIAGNOSIS — Z1231 Encounter for screening mammogram for malignant neoplasm of breast: Secondary | ICD-10-CM

## 2013-02-27 ENCOUNTER — Encounter (HOSPITAL_COMMUNITY): Payer: Self-pay

## 2013-02-27 ENCOUNTER — Other Ambulatory Visit (HOSPITAL_BASED_OUTPATIENT_CLINIC_OR_DEPARTMENT_OTHER): Payer: Medicare Other

## 2013-02-27 ENCOUNTER — Ambulatory Visit (HOSPITAL_COMMUNITY)
Admission: RE | Admit: 2013-02-27 | Discharge: 2013-02-27 | Disposition: A | Discharge status: Home / Self Care | Payer: Medicare Other | Source: Ambulatory Visit | Attending: Internal Medicine | Admitting: Internal Medicine

## 2013-02-27 DIAGNOSIS — C3492 Malignant neoplasm of unspecified part of left bronchus or lung: Secondary | ICD-10-CM

## 2013-02-27 DIAGNOSIS — K449 Diaphragmatic hernia without obstruction or gangrene: Secondary | ICD-10-CM | POA: Diagnosis not present

## 2013-02-27 DIAGNOSIS — Z85118 Personal history of other malignant neoplasm of bronchus and lung: Secondary | ICD-10-CM

## 2013-02-27 DIAGNOSIS — R918 Other nonspecific abnormal finding of lung field: Secondary | ICD-10-CM | POA: Insufficient documentation

## 2013-02-27 DIAGNOSIS — C3490 Malignant neoplasm of unspecified part of unspecified bronchus or lung: Secondary | ICD-10-CM | POA: Insufficient documentation

## 2013-02-27 LAB — CBC WITH DIFFERENTIAL/PLATELET
BASO%: 1 % (ref 0.0–2.0)
Basophils Absolute: 0.1 10*3/uL (ref 0.0–0.1)
EOS ABS: 0.2 10*3/uL (ref 0.0–0.5)
EOS%: 2.8 % (ref 0.0–7.0)
HCT: 39.2 % (ref 34.8–46.6)
HGB: 12.6 g/dL (ref 11.6–15.9)
LYMPH%: 28.8 % (ref 14.0–49.7)
MCH: 27.1 pg (ref 25.1–34.0)
MCHC: 32.2 g/dL (ref 31.5–36.0)
MCV: 84.1 fL (ref 79.5–101.0)
MONO#: 0.8 10*3/uL (ref 0.1–0.9)
MONO%: 10.8 % (ref 0.0–14.0)
NEUT#: 4 10*3/uL (ref 1.5–6.5)
NEUT%: 56.6 % (ref 38.4–76.8)
PLATELETS: 237 10*3/uL (ref 145–400)
RBC: 4.66 10*6/uL (ref 3.70–5.45)
RDW: 16 % — ABNORMAL HIGH (ref 11.2–14.5)
WBC: 7 10*3/uL (ref 3.9–10.3)
lymph#: 2 10*3/uL (ref 0.9–3.3)

## 2013-02-27 LAB — COMPREHENSIVE METABOLIC PANEL (CC13)
ALK PHOS: 64 U/L (ref 40–150)
ALT: 20 U/L (ref 0–55)
ANION GAP: 12 meq/L — AB (ref 3–11)
AST: 22 U/L (ref 5–34)
Albumin: 3.6 g/dL (ref 3.5–5.0)
BILIRUBIN TOTAL: 0.41 mg/dL (ref 0.20–1.20)
BUN: 24.7 mg/dL (ref 7.0–26.0)
CO2: 28 mEq/L (ref 22–29)
CREATININE: 1.1 mg/dL (ref 0.6–1.1)
Calcium: 9.4 mg/dL (ref 8.4–10.4)
Chloride: 102 mEq/L (ref 98–109)
GLUCOSE: 105 mg/dL (ref 70–140)
Potassium: 4.1 mEq/L (ref 3.5–5.1)
Sodium: 142 mEq/L (ref 136–145)
Total Protein: 7.1 g/dL (ref 6.4–8.3)

## 2013-02-27 MED ORDER — IOHEXOL 300 MG/ML  SOLN
80.0000 mL | Freq: Once | INTRAMUSCULAR | Status: AC | PRN
  Administered 2013-02-27: 80 mL via INTRAVENOUS

## 2013-03-01 ENCOUNTER — Ambulatory Visit (HOSPITAL_BASED_OUTPATIENT_CLINIC_OR_DEPARTMENT_OTHER): Payer: Medicare Other | Admitting: Internal Medicine

## 2013-03-01 ENCOUNTER — Encounter: Payer: Self-pay | Admitting: Internal Medicine

## 2013-03-01 ENCOUNTER — Telehealth: Payer: Self-pay | Admitting: Internal Medicine

## 2013-03-01 VITALS — BP 164/78 | HR 86 | Temp 97.3°F | Resp 18 | Ht 65.0 in | Wt 210.2 lb

## 2013-03-01 DIAGNOSIS — Z85118 Personal history of other malignant neoplasm of bronchus and lung: Secondary | ICD-10-CM | POA: Diagnosis not present

## 2013-03-01 DIAGNOSIS — C3492 Malignant neoplasm of unspecified part of left bronchus or lung: Secondary | ICD-10-CM

## 2013-03-01 DIAGNOSIS — I10 Essential (primary) hypertension: Secondary | ICD-10-CM | POA: Diagnosis not present

## 2013-03-01 NOTE — Telephone Encounter (Signed)
gv and printed appt sched and avs fo rpt for July °

## 2013-03-01 NOTE — Patient Instructions (Signed)
Followup visit in 6 months with repeat CT scan of the chest.

## 2013-03-01 NOTE — Progress Notes (Signed)
[place] Telephone:(336) [phone]   Fax:(336) [phone]  OFFICE PROGRESS NOTE  [name], MD [address]  [place] [address]  DIAGNOSIS: Stage IB (T2, N0, M0) non-small cell lung cancer consistent with adenocarcinoma with bronchoalveolar features diagnosed in October of 2006.   PRIOR THERAPY: Status post left upper lobectomy under the care of Dr. Arlyce Dice on 12/11/2004.   CURRENT THERAPY: Observation  INTERVAL HISTORY: Alison Dunn 80 y.o. female returns to the clinic today for followup visit accompanied by her daughter and Neice. The patient is feeling fine today with no specific complaints. She denied having any significant chest pain, shortness of breath, cough or hemoptysis. The patient denied having any nausea or vomiting. She denied having any significant weight loss or night sweats. The patient had repeat CT scan of the chest performed recently and she is here for evaluation and discussion of her scan results.  MEDICAL HISTORY: Past Medical History  Diagnosis Date  . Hypertension   . lung ca dx'd 2006    ALLERGIES:  is allergic to codeine and vicodin.  MEDICATIONS:  Current Outpatient Prescriptions  Medication Sig Dispense Refill  . acetaminophen (TYLENOL) 500 MG tablet Take 500 mg by mouth 2 (two) times daily.      . calcium carbonate (OS-CAL) 600 MG TABS Take 600 mg by mouth 2 (two) times daily with a meal.      . cholecalciferol (VITAMIN D) 1000 UNITS tablet Take 1,000 Units by mouth daily.      . hydrochlorothiazide (HYDRODIURIL) 25 MG tablet Take 25 mg by mouth daily.      . Multiple Vitamin (MULTIVITAMIN WITH MINERALS) TABS Take 1 tablet by mouth daily.      . propranolol (INDERAL) 80 MG tablet Take 80 mg by mouth 3 (three) times daily.      . psyllium (HYDROCIL/METAMUCIL) 95 % PACK Take 1 packet by mouth daily.      . RABEprazole (ACIPHEX) 20 MG tablet Take 20 mg by mouth daily.      . vitamin C (ASCORBIC ACID) 500  MG tablet Take 500 mg by mouth daily.      Marland Kitchen warfarin (COUMADIN) 5 MG tablet Take 3 mg by mouth daily. Take as directed by Dr Felipa Eth       No current facility-administered medications for this visit.    SURGICAL HISTORY:  Past Surgical History  Procedure Laterality Date  . Knee surgery    . Hip surgery    . Lung removal, partial      Left lower lobe removed     REVIEW OF SYSTEMS:  A comprehensive review of systems was negative.   PHYSICAL EXAMINATION: General appearance: alert, cooperative and no distress Head: Normocephalic, without obvious abnormality, atraumatic Neck: no adenopathy, no JVD, supple, symmetrical, trachea midline and thyroid not enlarged, symmetric, no tenderness/mass/nodules Lymph nodes: Cervical, supraclavicular, and axillary nodes normal. Resp: clear to auscultation bilaterally Back: symmetric, no curvature. ROM normal. No CVA tenderness. Cardio: regular rate and rhythm, S1, S2 normal, no murmur, click, rub or gallop GI: soft, non-tender; bowel sounds normal; no masses,  no organomegaly Extremities: extremities normal, atraumatic, no cyanosis or edema  ECOG PERFORMANCE STATUS: 1 - Symptomatic but completely ambulatory  Blood pressure 164/78, pulse 86, temperature 97.3 F (36.3 C), temperature source Oral, resp. rate 18, height 5\' 5"  (1.651 m), weight 210 lb 3.2 oz (95.346 kg), SpO2 100.00%.  LABORATORY DATA: Lab Results  Component Value Date  WBC 7.0 02/27/2013   HGB 12.6 02/27/2013   HCT 39.2 02/27/2013   MCV 84.1 02/27/2013   PLT 237 02/27/2013      Chemistry      Component Value Date/Time   NA 142 02/27/2013 0934   NA 135 05/03/2012 0744   NA 145 11/24/2010 0919   K 4.1 02/27/2013 0934   K 3.8 05/03/2012 0744   K 4.3 11/24/2010 0919   CL 99 05/03/2012 0744   CL 103 11/27/2011 0928   CL 101 11/24/2010 0919   CO2 28 02/27/2013 0934   CO2 27 05/03/2012 0744   CO2 28 11/24/2010 0919   BUN 24.7 02/27/2013 0934   BUN 17 05/03/2012 0744   BUN 23* 11/24/2010 0919    CREATININE 1.1 02/27/2013 0934   CREATININE 0.82 05/03/2012 0744   CREATININE 1.1 11/24/2010 0919      Component Value Date/Time   CALCIUM 9.4 02/27/2013 0934   CALCIUM 9.3 05/03/2012 0744   CALCIUM 9.2 11/24/2010 0919   ALKPHOS 64 02/27/2013 0934   ALKPHOS 64 05/03/2012 0744   ALKPHOS 61 11/24/2010 0919   AST 22 02/27/2013 0934   AST 24 05/03/2012 0744   AST 24 11/24/2010 0919   ALT 20 02/27/2013 0934   ALT 21 05/03/2012 0744   ALT 20 11/24/2010 0919   BILITOT 0.41 02/27/2013 0934   BILITOT 0.3 05/03/2012 0744   BILITOT 0.50 11/24/2010 0919       RADIOGRAPHIC STUDIES:  Ct Chest W Contrast  02/27/2013   CLINICAL DATA:  Lung cancer.  EXAM: CT CHEST WITH CONTRAST  TECHNIQUE: Multidetector CT imaging of the chest was performed during intravenous contrast administration.  CONTRAST:  71mL OMNIPAQUE IOHEXOL 300 MG/ML  SOLN  COMPARISON:  11/25/2012 and 11/27/2011.  FINDINGS: No pathologically enlarged mediastinal, hilar or axillary lymph nodes. Heart size within normal limits. No pericardial effusion. Moderate hiatal hernia.  Mild biapical pleural parenchymal scarring. Postoperative changes of left upper lobectomy. Minimal subpleural scarring in the right upper lobe (image 15), at the site of previously questioned ground-glass nodule. Scattered tiny pulmonary nodular densities, measuring up to 5 mm in the subpleural left lower lobe (image 26), are unchanged. No pleural fluid. Airway is otherwise unremarkable.  Incidental imaging of the upper abdomen shows no acute findings. Slight nodular thickening of the medial limb left adrenal gland is unchanged. Bilateral renal cortical scarring. No worrisome lytic or sclerotic lesions. Degenerative changes are seen in the spine.  IMPRESSION: 1. Postoperative changes of left upper lobectomy without evidence of recurrent or metastatic disease. 2. Subpleural scarring in the right upper lobe, at the site of previously questioned ground-glass nodule. 3. Scattered small pulmonary nodular  densities are unchanged.   Electronically Signed   By: Lorin Picket M.D.   On: 02/27/2013 11:27   Mm Digital Screening  02/20/2013   CLINICAL DATA:  Screening.  EXAM: DIGITAL SCREENING BILATERAL MAMMOGRAM WITH CAD  COMPARISON:  Previous exam(s).  ACR Breast Density Category b: There are scattered areas of fibroglandular density.  FINDINGS: There are no findings suspicious for malignancy. Images were processed with CAD.  IMPRESSION: No mammographic evidence of malignancy. A result letter of this screening mammogram will be mailed directly to the patient.  RECOMMENDATION: Screening mammogram in one year. (Code:SM-B-01Y)  BI-RADS CATEGORY  1: Negative   Electronically Signed   By: Everlean Alstrom M.D.   On: 02/20/2013 15:10   ASSESSMENT AND PLAN: This is a very pleasant 80 years old white female with history of  a stage IB non-small cell lung cancer status post left upper lobectomy in October of 2006 and has been observation since that time with no evidence for disease recurrence. The recent scan showed no evidence for disease progression and stable subpleural scarring in the right upper lobe. I discussed the scan results with the patient and her family. I recommended for her to have repeat CT scan of the chest in 6 months for reevaluation of this lesion. The patient agreed to the current plan. She was advised to call immediately if she has any concerning symptoms in the interval. The patient voices understanding of current disease status and treatment options and is in agreement with the current care plan.  All questions were answered. The patient knows to call the clinic with any problems, questions or concerns. We can certainly see the patient much sooner if necessary.

## 2013-03-02 DIAGNOSIS — Z86711 Personal history of pulmonary embolism: Secondary | ICD-10-CM | POA: Diagnosis not present

## 2013-03-02 DIAGNOSIS — Z7901 Long term (current) use of anticoagulants: Secondary | ICD-10-CM | POA: Diagnosis not present

## 2013-03-29 DIAGNOSIS — Z7901 Long term (current) use of anticoagulants: Secondary | ICD-10-CM | POA: Diagnosis not present

## 2013-03-29 DIAGNOSIS — I2699 Other pulmonary embolism without acute cor pulmonale: Secondary | ICD-10-CM | POA: Diagnosis not present

## 2013-04-26 DIAGNOSIS — I2699 Other pulmonary embolism without acute cor pulmonale: Secondary | ICD-10-CM | POA: Diagnosis not present

## 2013-04-26 DIAGNOSIS — Z7901 Long term (current) use of anticoagulants: Secondary | ICD-10-CM | POA: Diagnosis not present

## 2013-05-24 DIAGNOSIS — I2699 Other pulmonary embolism without acute cor pulmonale: Secondary | ICD-10-CM | POA: Diagnosis not present

## 2013-05-24 DIAGNOSIS — Z7901 Long term (current) use of anticoagulants: Secondary | ICD-10-CM | POA: Diagnosis not present

## 2013-06-14 DIAGNOSIS — Z7901 Long term (current) use of anticoagulants: Secondary | ICD-10-CM | POA: Diagnosis not present

## 2013-06-14 DIAGNOSIS — I2699 Other pulmonary embolism without acute cor pulmonale: Secondary | ICD-10-CM | POA: Diagnosis not present

## 2013-06-16 DIAGNOSIS — Z947 Corneal transplant status: Secondary | ICD-10-CM | POA: Diagnosis not present

## 2013-06-16 DIAGNOSIS — Z9849 Cataract extraction status, unspecified eye: Secondary | ICD-10-CM | POA: Diagnosis not present

## 2013-07-14 DIAGNOSIS — Z7901 Long term (current) use of anticoagulants: Secondary | ICD-10-CM | POA: Diagnosis not present

## 2013-07-14 DIAGNOSIS — I2699 Other pulmonary embolism without acute cor pulmonale: Secondary | ICD-10-CM | POA: Diagnosis not present

## 2013-07-26 DIAGNOSIS — Z9849 Cataract extraction status, unspecified eye: Secondary | ICD-10-CM | POA: Diagnosis not present

## 2013-07-26 DIAGNOSIS — Z947 Corneal transplant status: Secondary | ICD-10-CM | POA: Diagnosis not present

## 2013-08-16 DIAGNOSIS — Z7901 Long term (current) use of anticoagulants: Secondary | ICD-10-CM | POA: Diagnosis not present

## 2013-08-16 DIAGNOSIS — I2699 Other pulmonary embolism without acute cor pulmonale: Secondary | ICD-10-CM | POA: Diagnosis not present

## 2013-08-29 ENCOUNTER — Ambulatory Visit (HOSPITAL_COMMUNITY)
Admission: RE | Admit: 2013-08-29 | Discharge: 2013-08-29 | Disposition: A | Discharge status: Home / Self Care | Payer: Medicare Other | Source: Ambulatory Visit | Attending: Internal Medicine | Admitting: Internal Medicine

## 2013-08-29 ENCOUNTER — Other Ambulatory Visit (HOSPITAL_BASED_OUTPATIENT_CLINIC_OR_DEPARTMENT_OTHER): Payer: Medicare Other

## 2013-08-29 DIAGNOSIS — R0602 Shortness of breath: Secondary | ICD-10-CM | POA: Insufficient documentation

## 2013-08-29 DIAGNOSIS — Z902 Acquired absence of lung [part of]: Secondary | ICD-10-CM | POA: Diagnosis not present

## 2013-08-29 DIAGNOSIS — C3492 Malignant neoplasm of unspecified part of left bronchus or lung: Secondary | ICD-10-CM

## 2013-08-29 DIAGNOSIS — C3490 Malignant neoplasm of unspecified part of unspecified bronchus or lung: Secondary | ICD-10-CM | POA: Insufficient documentation

## 2013-08-29 DIAGNOSIS — K449 Diaphragmatic hernia without obstruction or gangrene: Secondary | ICD-10-CM | POA: Diagnosis not present

## 2013-08-29 DIAGNOSIS — Z85118 Personal history of other malignant neoplasm of bronchus and lung: Secondary | ICD-10-CM | POA: Diagnosis not present

## 2013-08-29 DIAGNOSIS — I700 Atherosclerosis of aorta: Secondary | ICD-10-CM | POA: Diagnosis not present

## 2013-08-29 LAB — CBC WITH DIFFERENTIAL/PLATELET
BASO%: 0.7 % (ref 0.0–2.0)
Basophils Absolute: 0.1 10e3/uL (ref 0.0–0.1)
EOS%: 2.2 % (ref 0.0–7.0)
Eosinophils Absolute: 0.2 10e3/uL (ref 0.0–0.5)
HCT: 41.1 % (ref 34.8–46.6)
HGB: 13.3 g/dL (ref 11.6–15.9)
LYMPH%: 24.1 % (ref 14.0–49.7)
MCH: 28.7 pg (ref 25.1–34.0)
MCHC: 32.4 g/dL (ref 31.5–36.0)
MCV: 88.8 fL (ref 79.5–101.0)
MONO#: 0.7 10e3/uL (ref 0.1–0.9)
MONO%: 10 % (ref 0.0–14.0)
NEUT#: 4.6 10e3/uL (ref 1.5–6.5)
NEUT%: 63 % (ref 38.4–76.8)
Platelets: 231 10e3/uL (ref 145–400)
RBC: 4.63 10e6/uL (ref 3.70–5.45)
RDW: 15.6 % — ABNORMAL HIGH (ref 11.2–14.5)
WBC: 7.3 10e3/uL (ref 3.9–10.3)
lymph#: 1.7 10e3/uL (ref 0.9–3.3)

## 2013-08-29 LAB — COMPREHENSIVE METABOLIC PANEL (CC13)
ALT: 23 U/L (ref 0–55)
AST: 23 U/L (ref 5–34)
Albumin: 3.6 g/dL (ref 3.5–5.0)
Alkaline Phosphatase: 54 U/L (ref 40–150)
Anion Gap: 10 meq/L (ref 3–11)
BUN: 25.7 mg/dL (ref 7.0–26.0)
CO2: 27 meq/L (ref 22–29)
Calcium: 9.5 mg/dL (ref 8.4–10.4)
Chloride: 103 meq/L (ref 98–109)
Creatinine: 1.1 mg/dL (ref 0.6–1.1)
Glucose: 101 mg/dL (ref 70–140)
Potassium: 4.2 meq/L (ref 3.5–5.1)
Sodium: 140 meq/L (ref 136–145)
Total Bilirubin: 0.44 mg/dL (ref 0.20–1.20)
Total Protein: 6.8 g/dL (ref 6.4–8.3)

## 2013-08-29 MED ORDER — IOHEXOL 300 MG/ML  SOLN
80.0000 mL | Freq: Once | INTRAMUSCULAR | Status: AC | PRN
  Administered 2013-08-29: 80 mL via INTRAVENOUS

## 2013-09-05 ENCOUNTER — Ambulatory Visit (HOSPITAL_BASED_OUTPATIENT_CLINIC_OR_DEPARTMENT_OTHER): Payer: Medicare Other | Admitting: Internal Medicine

## 2013-09-05 ENCOUNTER — Encounter: Payer: Self-pay | Admitting: Internal Medicine

## 2013-09-05 ENCOUNTER — Telehealth: Payer: Self-pay | Admitting: Internal Medicine

## 2013-09-05 VITALS — BP 162/91 | HR 76 | Temp 97.7°F | Resp 18 | Ht 65.0 in | Wt 208.8 lb

## 2013-09-05 DIAGNOSIS — C3410 Malignant neoplasm of upper lobe, unspecified bronchus or lung: Secondary | ICD-10-CM | POA: Diagnosis not present

## 2013-09-05 DIAGNOSIS — C3492 Malignant neoplasm of unspecified part of left bronchus or lung: Secondary | ICD-10-CM

## 2013-09-05 NOTE — Telephone Encounter (Signed)
gv adn pritned appt sched and avs for pt fro July 2016

## 2013-09-05 NOTE — Progress Notes (Signed)
[place] Telephone:(336) [phone]   Fax:(336) [phone]  OFFICE PROGRESS NOTE  [name], MD [address] [place] [address]  DIAGNOSIS: Stage IB (T2, N0, M0) non-small cell lung cancer consistent with adenocarcinoma with bronchoalveolar features diagnosed in October of 2006.   PRIOR THERAPY: Status post left upper lobectomy under the care of Dr. Arlyce Dice on 12/11/2004.   CURRENT THERAPY: Observation  INTERVAL HISTORY: Alison Dunn 80 y.o. female returns to the clinic today for followup visit accompanied by her and Neice. She recently celebrated her 65 birthday. The patient is feeling fine today with no specific complaints. She denied having any significant chest pain, shortness of breath, cough or hemoptysis. The patient denied having any nausea or vomiting. She denied having any significant weight loss or night sweats. The patient had repeat CT scan of the chest performed recently and she is here for evaluation and discussion of her scan results.  MEDICAL HISTORY: Past Medical History  Diagnosis Date  . Hypertension   . lung ca dx'd 2006    ALLERGIES:  is allergic to codeine and vicodin.  MEDICATIONS:  Current Outpatient Prescriptions  Medication Sig Dispense Refill  . acetaminophen (TYLENOL) 500 MG tablet Take 500 mg by mouth 2 (two) times daily.      . calcium carbonate (OS-CAL) 600 MG TABS Take 600 mg by mouth 2 (two) times daily with a meal.      . cholecalciferol (VITAMIN D) 1000 UNITS tablet Take 1,000 Units by mouth daily.      . hydrochlorothiazide (HYDRODIURIL) 25 MG tablet Take 25 mg by mouth daily.      . Multiple Vitamin (MULTIVITAMIN WITH MINERALS) TABS Take 1 tablet by mouth daily.      . propranolol (INDERAL) 80 MG tablet Take 80 mg by mouth daily. For high BP      . psyllium (HYDROCIL/METAMUCIL) 95 % PACK Take 1 packet by mouth daily.      . RABEprazole (ACIPHEX) 20 MG tablet Take 20 mg by mouth daily.      .  vitamin C (ASCORBIC ACID) 500 MG tablet Take 500 mg by mouth daily.      Marland Kitchen warfarin (COUMADIN) 5 MG tablet Take 3 mg by mouth daily. Take as directed by Dr Felipa Eth       No current facility-administered medications for this visit.    SURGICAL HISTORY:  Past Surgical History  Procedure Laterality Date  . Knee surgery    . Hip surgery    . Lung removal, partial      Left lower lobe removed     REVIEW OF SYSTEMS:  A comprehensive review of systems was negative except for: Respiratory: positive for dyspnea on exertion   PHYSICAL EXAMINATION: General appearance: alert, cooperative and no distress Head: Normocephalic, without obvious abnormality, atraumatic Neck: no adenopathy, no JVD, supple, symmetrical, trachea midline and thyroid not enlarged, symmetric, no tenderness/mass/nodules Lymph nodes: Cervical, supraclavicular, and axillary nodes normal. Resp: clear to auscultation bilaterally Back: symmetric, no curvature. ROM normal. No CVA tenderness. Cardio: regular rate and rhythm, S1, S2 normal, no murmur, click, rub or gallop GI: soft, non-tender; bowel sounds normal; no masses,  no organomegaly Extremities: extremities normal, atraumatic, no cyanosis or edema  ECOG PERFORMANCE STATUS: 1 - Symptomatic but completely ambulatory  Blood pressure 162/91, pulse 76, temperature 97.7 F (36.5 C), temperature source Oral, resp. rate 18, height 5\' 5"  (1.651 m), weight 208 lb 12.8 oz (94.711 kg).  LABORATORY DATA: Lab Results  Component Value Date   WBC 7.3 08/29/2013   HGB 13.3 08/29/2013   HCT 41.1 08/29/2013   MCV 88.8 08/29/2013   PLT 231 08/29/2013      Chemistry      Component Value Date/Time   NA 140 08/29/2013 0856   NA 135 05/03/2012 0744   NA 145 11/24/2010 0919   K 4.2 08/29/2013 0856   K 3.8 05/03/2012 0744   K 4.3 11/24/2010 0919   CL 99 05/03/2012 0744   CL 103 11/27/2011 0928   CL 101 11/24/2010 0919   CO2 27 08/29/2013 0856   CO2 27 05/03/2012 0744   CO2 28 11/24/2010 0919   BUN  25.7 08/29/2013 0856   BUN 17 05/03/2012 0744   BUN 23* 11/24/2010 0919   CREATININE 1.1 08/29/2013 0856   CREATININE 0.82 05/03/2012 0744   CREATININE 1.1 11/24/2010 0919      Component Value Date/Time   CALCIUM 9.5 08/29/2013 0856   CALCIUM 9.3 05/03/2012 0744   CALCIUM 9.2 11/24/2010 0919   ALKPHOS 54 08/29/2013 0856   ALKPHOS 64 05/03/2012 0744   ALKPHOS 61 11/24/2010 0919   AST 23 08/29/2013 0856   AST 24 05/03/2012 0744   AST 24 11/24/2010 0919   ALT 23 08/29/2013 0856   ALT 21 05/03/2012 0744   ALT 20 11/24/2010 0919   BILITOT 0.44 08/29/2013 0856   BILITOT 0.3 05/03/2012 0744   BILITOT 0.50 11/24/2010 0919       RADIOGRAPHIC STUDIES:  Ct Chest W Contrast  08/29/2013   CLINICAL DATA:  Lung cancer diagnosed in 2006 status post partial lung resection. Shortness of breath.  EXAM: CT CHEST WITH CONTRAST  TECHNIQUE: Multidetector CT imaging of the chest was performed during intravenous contrast administration.  CONTRAST:  81mL OMNIPAQUE IOHEXOL 300 MG/ML  SOLN  COMPARISON:  Chest CTs dated 02/27/2013 and 11/25/2012.  FINDINGS: There are no enlarged mediastinal, hilar or axillary lymph nodes. Small calcified left hilar lymph nodes are stable.  There is stable atherosclerosis of the aorta, great vessels and coronary arteries. There is a moderate size hiatal hernia. No significant pleural or pericardial effusion is present.  Patient is status post left upper lobe resection. Scattered subpleural nodularity in both lungs is stable, largest in the left lower lobe, measuring 5 mm on image 29. No new or enlarging nodules are identified.  The visualized upper abdomen appears unremarkable. There are no worrisome osseous findings.  IMPRESSION: 1. Stable postoperative chest CT. 2. No evidence of local recurrence or metastatic disease. 3. Stable scattered subpleural pulmonary nodularity.   Electronically Signed   By: Camie Patience M.D.   On: 08/29/2013 12:03   ASSESSMENT AND PLAN: This is a very pleasant 80 years old white  female with history of a stage IB non-small cell lung cancer status post left upper lobectomy in October of 2006 and has been observation since that time with no evidence for disease recurrence.  Her recent scan showed no evidence for disease recurrence. I discussed the scan results with the patient and her niece.  I recommended for her to have repeat CT scan of the chest in 1 year for reevaluation. The patient agreed to the current plan. She was advised to call immediately if she has any concerning symptoms in the interval. The patient voices understanding of current disease status and treatment options and is in agreement with the current care plan.  All questions were answered. The patient knows to call  the clinic with any problems, questions or concerns. We can certainly see the patient much sooner if necessary.

## 2013-09-06 DIAGNOSIS — Z947 Corneal transplant status: Secondary | ICD-10-CM | POA: Diagnosis not present

## 2013-09-06 DIAGNOSIS — Z9849 Cataract extraction status, unspecified eye: Secondary | ICD-10-CM | POA: Diagnosis not present

## 2013-09-12 DIAGNOSIS — Z7901 Long term (current) use of anticoagulants: Secondary | ICD-10-CM | POA: Diagnosis not present

## 2013-09-12 DIAGNOSIS — I2699 Other pulmonary embolism without acute cor pulmonale: Secondary | ICD-10-CM | POA: Diagnosis not present

## 2013-09-12 DIAGNOSIS — R21 Rash and other nonspecific skin eruption: Secondary | ICD-10-CM | POA: Diagnosis not present

## 2013-09-12 DIAGNOSIS — R5381 Other malaise: Secondary | ICD-10-CM | POA: Diagnosis not present

## 2013-10-16 DIAGNOSIS — Z947 Corneal transplant status: Secondary | ICD-10-CM | POA: Diagnosis not present

## 2013-10-16 DIAGNOSIS — Z9849 Cataract extraction status, unspecified eye: Secondary | ICD-10-CM | POA: Diagnosis not present

## 2013-10-18 DIAGNOSIS — Z7901 Long term (current) use of anticoagulants: Secondary | ICD-10-CM | POA: Diagnosis not present

## 2013-10-18 DIAGNOSIS — I2699 Other pulmonary embolism without acute cor pulmonale: Secondary | ICD-10-CM | POA: Diagnosis not present

## 2013-11-13 DIAGNOSIS — I2699 Other pulmonary embolism without acute cor pulmonale: Secondary | ICD-10-CM | POA: Diagnosis not present

## 2013-11-13 DIAGNOSIS — Z7901 Long term (current) use of anticoagulants: Secondary | ICD-10-CM | POA: Diagnosis not present

## 2013-11-23 DIAGNOSIS — Z23 Encounter for immunization: Secondary | ICD-10-CM | POA: Diagnosis not present

## 2013-12-04 DIAGNOSIS — Z7901 Long term (current) use of anticoagulants: Secondary | ICD-10-CM | POA: Diagnosis not present

## 2013-12-20 DIAGNOSIS — Z947 Corneal transplant status: Secondary | ICD-10-CM | POA: Diagnosis not present

## 2013-12-20 DIAGNOSIS — Z48298 Encounter for aftercare following other organ transplant: Secondary | ICD-10-CM | POA: Diagnosis not present

## 2013-12-20 DIAGNOSIS — H16002 Unspecified corneal ulcer, left eye: Secondary | ICD-10-CM | POA: Diagnosis not present

## 2013-12-21 DIAGNOSIS — M79661 Pain in right lower leg: Secondary | ICD-10-CM | POA: Diagnosis not present

## 2013-12-21 DIAGNOSIS — M79605 Pain in left leg: Secondary | ICD-10-CM | POA: Diagnosis not present

## 2013-12-26 DIAGNOSIS — M2241 Chondromalacia patellae, right knee: Secondary | ICD-10-CM | POA: Diagnosis not present

## 2013-12-26 DIAGNOSIS — M7121 Synovial cyst of popliteal space [Baker], right knee: Secondary | ICD-10-CM | POA: Diagnosis not present

## 2013-12-26 DIAGNOSIS — M25561 Pain in right knee: Secondary | ICD-10-CM | POA: Diagnosis not present

## 2013-12-29 DIAGNOSIS — I2699 Other pulmonary embolism without acute cor pulmonale: Secondary | ICD-10-CM | POA: Diagnosis not present

## 2013-12-29 DIAGNOSIS — Z7901 Long term (current) use of anticoagulants: Secondary | ICD-10-CM | POA: Diagnosis not present

## 2014-01-11 DIAGNOSIS — M2241 Chondromalacia patellae, right knee: Secondary | ICD-10-CM | POA: Diagnosis not present

## 2014-01-11 DIAGNOSIS — M7121 Synovial cyst of popliteal space [Baker], right knee: Secondary | ICD-10-CM | POA: Diagnosis not present

## 2014-01-15 ENCOUNTER — Other Ambulatory Visit: Payer: Self-pay

## 2014-01-15 DIAGNOSIS — Z1231 Encounter for screening mammogram for malignant neoplasm of breast: Secondary | ICD-10-CM

## 2014-01-22 DIAGNOSIS — Z7901 Long term (current) use of anticoagulants: Secondary | ICD-10-CM | POA: Diagnosis not present

## 2014-01-22 DIAGNOSIS — I2699 Other pulmonary embolism without acute cor pulmonale: Secondary | ICD-10-CM | POA: Diagnosis not present

## 2014-01-29 DIAGNOSIS — I2699 Other pulmonary embolism without acute cor pulmonale: Secondary | ICD-10-CM | POA: Diagnosis not present

## 2014-01-29 DIAGNOSIS — Z7901 Long term (current) use of anticoagulants: Secondary | ICD-10-CM | POA: Diagnosis not present

## 2014-02-08 DIAGNOSIS — G250 Essential tremor: Secondary | ICD-10-CM | POA: Diagnosis not present

## 2014-02-08 DIAGNOSIS — E669 Obesity, unspecified: Secondary | ICD-10-CM | POA: Diagnosis not present

## 2014-02-08 DIAGNOSIS — Z0000 Encounter for general adult medical examination without abnormal findings: Secondary | ICD-10-CM | POA: Diagnosis not present

## 2014-02-08 DIAGNOSIS — K219 Gastro-esophageal reflux disease without esophagitis: Secondary | ICD-10-CM | POA: Diagnosis not present

## 2014-02-08 DIAGNOSIS — I2699 Other pulmonary embolism without acute cor pulmonale: Secondary | ICD-10-CM | POA: Diagnosis not present

## 2014-02-08 DIAGNOSIS — Z23 Encounter for immunization: Secondary | ICD-10-CM | POA: Diagnosis not present

## 2014-02-08 DIAGNOSIS — I10 Essential (primary) hypertension: Secondary | ICD-10-CM | POA: Diagnosis not present

## 2014-02-08 DIAGNOSIS — Z79899 Other long term (current) drug therapy: Secondary | ICD-10-CM | POA: Diagnosis not present

## 2014-02-08 DIAGNOSIS — Z1389 Encounter for screening for other disorder: Secondary | ICD-10-CM | POA: Diagnosis not present

## 2014-02-20 DIAGNOSIS — Z7901 Long term (current) use of anticoagulants: Secondary | ICD-10-CM | POA: Diagnosis not present

## 2014-02-21 ENCOUNTER — Ambulatory Visit: Payer: Medicare Other

## 2014-03-19 NOTE — Telephone Encounter (Addendum)
-----   Message from Barron Alvine, [place] sent at 03/15/2014  2:44 PM EST ----- Error

## 2014-03-22 DIAGNOSIS — Z7901 Long term (current) use of anticoagulants: Secondary | ICD-10-CM | POA: Diagnosis not present

## 2014-04-19 DIAGNOSIS — Z7901 Long term (current) use of anticoagulants: Secondary | ICD-10-CM | POA: Diagnosis not present

## 2014-05-03 ENCOUNTER — Encounter (HOSPITAL_COMMUNITY): Payer: Self-pay

## 2014-05-03 ENCOUNTER — Ambulatory Visit (HOSPITAL_COMMUNITY): Admit: 2014-05-03 | Payer: Self-pay | Admitting: Gastroenterology

## 2014-05-03 SURGERY — COLONOSCOPY WITH PROPOFOL
Anesthesia: Monitor Anesthesia Care

## 2014-05-17 DIAGNOSIS — Z7901 Long term (current) use of anticoagulants: Secondary | ICD-10-CM | POA: Diagnosis not present

## 2014-06-13 DIAGNOSIS — H40003 Preglaucoma, unspecified, bilateral: Secondary | ICD-10-CM | POA: Diagnosis not present

## 2014-06-13 DIAGNOSIS — Z9842 Cataract extraction status, left eye: Secondary | ICD-10-CM | POA: Diagnosis not present

## 2014-06-13 DIAGNOSIS — Z7901 Long term (current) use of anticoagulants: Secondary | ICD-10-CM | POA: Diagnosis not present

## 2014-06-13 DIAGNOSIS — Z947 Corneal transplant status: Secondary | ICD-10-CM | POA: Diagnosis not present

## 2014-06-13 DIAGNOSIS — H2511 Age-related nuclear cataract, right eye: Secondary | ICD-10-CM | POA: Diagnosis not present

## 2014-07-09 DIAGNOSIS — Z7901 Long term (current) use of anticoagulants: Secondary | ICD-10-CM | POA: Diagnosis not present

## 2014-08-08 DIAGNOSIS — I10 Essential (primary) hypertension: Secondary | ICD-10-CM | POA: Diagnosis not present

## 2014-08-08 DIAGNOSIS — Z7901 Long term (current) use of anticoagulants: Secondary | ICD-10-CM | POA: Diagnosis not present

## 2014-08-08 DIAGNOSIS — I2699 Other pulmonary embolism without acute cor pulmonale: Secondary | ICD-10-CM | POA: Diagnosis not present

## 2014-08-08 DIAGNOSIS — Z79899 Other long term (current) drug therapy: Secondary | ICD-10-CM | POA: Diagnosis not present

## 2014-09-04 ENCOUNTER — Ambulatory Visit (HOSPITAL_COMMUNITY)
Admission: RE | Admit: 2014-09-04 | Discharge: 2014-09-04 | Disposition: A | Discharge status: Home / Self Care | Payer: Medicare Other | Source: Ambulatory Visit | Attending: Internal Medicine | Admitting: Internal Medicine

## 2014-09-04 ENCOUNTER — Other Ambulatory Visit (HOSPITAL_BASED_OUTPATIENT_CLINIC_OR_DEPARTMENT_OTHER): Payer: Medicare Other

## 2014-09-04 ENCOUNTER — Encounter (HOSPITAL_COMMUNITY): Payer: Self-pay

## 2014-09-04 DIAGNOSIS — Z902 Acquired absence of lung [part of]: Secondary | ICD-10-CM | POA: Insufficient documentation

## 2014-09-04 DIAGNOSIS — Z7901 Long term (current) use of anticoagulants: Secondary | ICD-10-CM | POA: Diagnosis not present

## 2014-09-04 DIAGNOSIS — R918 Other nonspecific abnormal finding of lung field: Secondary | ICD-10-CM | POA: Insufficient documentation

## 2014-09-04 DIAGNOSIS — I2510 Atherosclerotic heart disease of native coronary artery without angina pectoris: Secondary | ICD-10-CM | POA: Diagnosis not present

## 2014-09-04 DIAGNOSIS — Z85118 Personal history of other malignant neoplasm of bronchus and lung: Secondary | ICD-10-CM | POA: Diagnosis present

## 2014-09-04 DIAGNOSIS — C3492 Malignant neoplasm of unspecified part of left bronchus or lung: Secondary | ICD-10-CM | POA: Diagnosis not present

## 2014-09-04 DIAGNOSIS — Z08 Encounter for follow-up examination after completed treatment for malignant neoplasm: Secondary | ICD-10-CM | POA: Insufficient documentation

## 2014-09-04 LAB — COMPREHENSIVE METABOLIC PANEL (CC13)
ALK PHOS: 65 U/L (ref 40–150)
ALT: 18 U/L (ref 0–55)
AST: 20 U/L (ref 5–34)
Albumin: 3.7 g/dL (ref 3.5–5.0)
Anion Gap: 8 mEq/L (ref 3–11)
BILIRUBIN TOTAL: 0.48 mg/dL (ref 0.20–1.20)
BUN: 28.3 mg/dL — ABNORMAL HIGH (ref 7.0–26.0)
CHLORIDE: 102 meq/L (ref 98–109)
CO2: 30 meq/L — AB (ref 22–29)
Calcium: 9.6 mg/dL (ref 8.4–10.4)
Creatinine: 1.1 mg/dL (ref 0.6–1.1)
EGFR: 47 mL/min/{1.73_m2} — ABNORMAL LOW (ref >90)
Glucose: 94 mg/dl (ref 70–140)
Potassium: 4.3 mEq/L (ref 3.5–5.1)
Sodium: 140 mEq/L (ref 136–145)
TOTAL PROTEIN: 6.8 g/dL (ref 6.4–8.3)

## 2014-09-04 LAB — CBC WITH DIFFERENTIAL/PLATELET
BASO%: 0.8 % (ref 0.0–2.0)
Basophils Absolute: 0.1 10*3/uL (ref 0.0–0.1)
EOS%: 2.5 % (ref 0.0–7.0)
Eosinophils Absolute: 0.2 10*3/uL (ref 0.0–0.5)
HCT: 41.7 % (ref 34.8–46.6)
HGB: 13.7 g/dL (ref 11.6–15.9)
LYMPH#: 1.8 10*3/uL (ref 0.9–3.3)
LYMPH%: 28.2 % (ref 14.0–49.7)
MCH: 29.5 pg (ref 25.1–34.0)
MCHC: 33 g/dL (ref 31.5–36.0)
MCV: 89.5 fL (ref 79.5–101.0)
MONO#: 0.7 10*3/uL (ref 0.1–0.9)
MONO%: 10.1 % (ref 0.0–14.0)
NEUT%: 58.4 % (ref 38.4–76.8)
NEUTROS ABS: 3.8 10*3/uL (ref 1.5–6.5)
Platelets: 244 10*3/uL (ref 145–400)
RBC: 4.66 10*6/uL (ref 3.70–5.45)
RDW: 15.4 % — ABNORMAL HIGH (ref 11.2–14.5)
WBC: 6.5 10*3/uL (ref 3.9–10.3)

## 2014-09-11 ENCOUNTER — Encounter: Payer: Self-pay | Admitting: Internal Medicine

## 2014-09-11 ENCOUNTER — Ambulatory Visit (HOSPITAL_BASED_OUTPATIENT_CLINIC_OR_DEPARTMENT_OTHER): Payer: Medicare Other | Admitting: Internal Medicine

## 2014-09-11 VITALS — BP 191/84 | HR 81 | Temp 98.4°F | Resp 18 | Ht 65.0 in | Wt 189.5 lb

## 2014-09-11 DIAGNOSIS — C3492 Malignant neoplasm of unspecified part of left bronchus or lung: Secondary | ICD-10-CM

## 2014-09-11 NOTE — Progress Notes (Signed)
[place] Telephone:(336) [phone]   Fax:(336) [phone]  OFFICE PROGRESS NOTE  [name], MD [address] [place] [address]  DIAGNOSIS: Stage IB (T2, N0, M0) non-small cell lung cancer consistent with adenocarcinoma with bronchoalveolar features diagnosed in October of 2006.   PRIOR THERAPY: Status post left upper lobectomy under the care of Dr. Arlyce Dice on 12/11/2004.   CURRENT THERAPY: Observation  INTERVAL HISTORY: Alison Dunn 81 y.o. female returns to the clinic today for annual followup visit accompanied by her and Neice. She has been observation for the last 10 years. The patient is feeling fine today with no specific complaints. She denied having any significant chest pain, shortness of breath, cough or hemoptysis. The patient denied having any nausea or vomiting. She denied having any significant weight loss or night sweats. The patient had repeat CT scan of the chest performed recently and she is here for evaluation and discussion of her scan results.  MEDICAL HISTORY: Past Medical History  Diagnosis Date  . Hypertension   . lung ca dx'd 2006    ALLERGIES:  is allergic to codeine and vicodin.  MEDICATIONS:  Current Outpatient Prescriptions  Medication Sig Dispense Refill  . acetaminophen (TYLENOL) 500 MG tablet Take 500 mg by mouth 2 (two) times daily.    . calcium carbonate (OS-CAL) 600 MG TABS Take 600 mg by mouth 2 (two) times daily with a meal.    . cholecalciferol (VITAMIN D) 1000 UNITS tablet Take 1,000 Units by mouth daily.    . hydrochlorothiazide (HYDRODIURIL) 25 MG tablet Take 25 mg by mouth daily.    . Multiple Vitamin (MULTIVITAMIN WITH MINERALS) TABS Take 1 tablet by mouth daily.    . propranolol (INDERAL) 80 MG tablet Take 80 mg by mouth daily. For high BP    . psyllium (HYDROCIL/METAMUCIL) 95 % PACK Take 1 packet by mouth daily.    . RABEprazole (ACIPHEX) 20 MG tablet Take 20 mg by mouth daily.    .  vitamin C (ASCORBIC ACID) 500 MG tablet Take 500 mg by mouth daily.    Marland Kitchen warfarin (COUMADIN) 5 MG tablet Take 3 mg by mouth daily. Take as directed by Dr Felipa Eth     No current facility-administered medications for this visit.    SURGICAL HISTORY:  Past Surgical History  Procedure Laterality Date  . Knee surgery    . Hip surgery    . Lung removal, partial      Left lower lobe removed     REVIEW OF SYSTEMS:  A comprehensive review of systems was negative except for: Respiratory: positive for dyspnea on exertion   PHYSICAL EXAMINATION: General appearance: alert, cooperative and no distress Head: Normocephalic, without obvious abnormality, atraumatic Neck: no adenopathy, no JVD, supple, symmetrical, trachea midline and thyroid not enlarged, symmetric, no tenderness/mass/nodules Lymph nodes: Cervical, supraclavicular, and axillary nodes normal. Resp: clear to auscultation bilaterally Back: symmetric, no curvature. ROM normal. No CVA tenderness. Cardio: regular rate and rhythm, S1, S2 normal, no murmur, click, rub or gallop GI: soft, non-tender; bowel sounds normal; no masses,  no organomegaly Extremities: extremities normal, atraumatic, no cyanosis or edema  ECOG PERFORMANCE STATUS: 1 - Symptomatic but completely ambulatory  There were no vitals taken for this visit.  LABORATORY DATA: Lab Results  Component Value Date   WBC 6.5 09/04/2014   HGB 13.7 09/04/2014   HCT 41.7 09/04/2014   MCV 89.5 09/04/2014   PLT 244 09/04/2014  Chemistry      Component Value Date/Time   NA 140 09/04/2014 0900   NA 135 05/03/2012 0744   NA 145 11/24/2010 0919   K 4.3 09/04/2014 0900   K 3.8 05/03/2012 0744   K 4.3 11/24/2010 0919   CL 99 05/03/2012 0744   CL 103 11/27/2011 0928   CL 101 11/24/2010 0919   CO2 30* 09/04/2014 0900   CO2 27 05/03/2012 0744   CO2 28 11/24/2010 0919   BUN 28.3* 09/04/2014 0900   BUN 17 05/03/2012 0744   BUN 23* 11/24/2010 0919   CREATININE 1.1  09/04/2014 0900   CREATININE 0.82 05/03/2012 0744   CREATININE 1.1 11/24/2010 0919      Component Value Date/Time   CALCIUM 9.6 09/04/2014 0900   CALCIUM 9.3 05/03/2012 0744   CALCIUM 9.2 11/24/2010 0919   ALKPHOS 65 09/04/2014 0900   ALKPHOS 64 05/03/2012 0744   ALKPHOS 61 11/24/2010 0919   AST 20 09/04/2014 0900   AST 24 05/03/2012 0744   AST 24 11/24/2010 0919   ALT 18 09/04/2014 0900   ALT 21 05/03/2012 0744   ALT 20 11/24/2010 0919   BILITOT 0.48 09/04/2014 0900   BILITOT 0.3 05/03/2012 0744   BILITOT 0.50 11/24/2010 0919       RADIOGRAPHIC STUDIES: Ct Chest Wo Contrast  09/04/2014   CLINICAL DATA:  Lung cancer diagnosed in 2006 with partial left lung resection. No chest complaints. Non-small-cell type. Staging.  EXAM: CT CHEST WITHOUT CONTRAST  TECHNIQUE: Multidetector CT imaging of the chest was performed following the standard protocol without IV contrast.  COMPARISON:  08/29/2013  FINDINGS: Mediastinum/Nodes: No supraclavicular adenopathy. Aortic and branch vessel atherosclerosis. Mild cardiomegaly with coronary artery atherosclerosis. No pericardial effusion. No mediastinal or definite hilar adenopathy, given limitations of unenhanced CT. Moderate hiatal hernia.  Lungs/Pleura: No pleural fluid. Status post left upper lobectomy. Subpleural left lower lobe 5 mm nodule on image 29 is unchanged.  Probable scarring in the left lower lobe on image 42, similar.  2 mm right upper lobe subpleural nodule on image 26 is unchanged.  Upper abdomen: Normal imaged portions of the liver, spleen, pancreas, adrenal glands, kidneys. Colonic diverticulosis incidentally noted.  Musculoskeletal: No acute osseous abnormality.  IMPRESSION: 1. Status post left upper lobectomy, without locally recurrent or metastatic disease. 2. Similar scattered pulmonary nodules, likely subpleural lymph nodes. 3.  Atherosclerosis, including within the coronary arteries.   Electronically Signed   By: Abigail Miyamoto M.D.    On: 09/04/2014 10:00   ASSESSMENT AND PLAN: This is a very pleasant 81 years old white female with history of a stage IB non-small cell lung cancer status post left upper lobectomy in October of 2006 and has been observation since that time with no evidence for disease recurrence.  Her recent scan showed no evidence for disease recurrence. I discussed the scan results with the patient and her niece. I recommended for her to follow-up with her primary care physician at this point since the patient has been observation for more than 10 years with no evidence for recurrence. The patient agreed to the current plan. She was advised to call immediately if she has any concerning symptoms. The patient voices understanding of current disease status and treatment options and is in agreement with the current care plan.  All questions were answered. The patient knows to call the clinic with any problems, questions or concerns. We can certainly see the patient much sooner if necessary.  Disclaimer: This note  was dictated with voice recognition software. Similar sounding words can inadvertently be transcribed and may be missed upon review.

## 2014-10-01 DIAGNOSIS — Z7901 Long term (current) use of anticoagulants: Secondary | ICD-10-CM | POA: Diagnosis not present

## 2014-10-30 DIAGNOSIS — Z7901 Long term (current) use of anticoagulants: Secondary | ICD-10-CM | POA: Diagnosis not present

## 2014-10-30 DIAGNOSIS — J069 Acute upper respiratory infection, unspecified: Secondary | ICD-10-CM | POA: Diagnosis not present

## 2014-11-29 DIAGNOSIS — Z7901 Long term (current) use of anticoagulants: Secondary | ICD-10-CM | POA: Diagnosis not present

## 2014-11-29 DIAGNOSIS — Z23 Encounter for immunization: Secondary | ICD-10-CM | POA: Diagnosis not present

## 2014-11-30 DIAGNOSIS — M79605 Pain in left leg: Secondary | ICD-10-CM | POA: Diagnosis not present

## 2014-12-26 DIAGNOSIS — M79605 Pain in left leg: Secondary | ICD-10-CM | POA: Diagnosis not present

## 2014-12-26 DIAGNOSIS — Z471 Aftercare following joint replacement surgery: Secondary | ICD-10-CM | POA: Diagnosis not present

## 2014-12-26 DIAGNOSIS — R2689 Other abnormalities of gait and mobility: Secondary | ICD-10-CM | POA: Diagnosis not present

## 2014-12-27 DIAGNOSIS — Z7901 Long term (current) use of anticoagulants: Secondary | ICD-10-CM | POA: Diagnosis not present

## 2014-12-31 DIAGNOSIS — M79605 Pain in left leg: Secondary | ICD-10-CM | POA: Diagnosis not present

## 2014-12-31 DIAGNOSIS — Z471 Aftercare following joint replacement surgery: Secondary | ICD-10-CM | POA: Diagnosis not present

## 2014-12-31 DIAGNOSIS — R2689 Other abnormalities of gait and mobility: Secondary | ICD-10-CM | POA: Diagnosis not present

## 2015-01-07 DIAGNOSIS — Z947 Corneal transplant status: Secondary | ICD-10-CM | POA: Diagnosis not present

## 2015-01-07 DIAGNOSIS — H40022 Open angle with borderline findings, high risk, left eye: Secondary | ICD-10-CM | POA: Diagnosis not present

## 2015-01-11 DIAGNOSIS — Z96652 Presence of left artificial knee joint: Secondary | ICD-10-CM | POA: Diagnosis not present

## 2015-01-11 DIAGNOSIS — Z471 Aftercare following joint replacement surgery: Secondary | ICD-10-CM | POA: Diagnosis not present

## 2015-01-11 DIAGNOSIS — M79605 Pain in left leg: Secondary | ICD-10-CM | POA: Diagnosis not present

## 2015-01-22 DIAGNOSIS — Z7901 Long term (current) use of anticoagulants: Secondary | ICD-10-CM | POA: Diagnosis not present

## 2015-01-23 DIAGNOSIS — H401123 Primary open-angle glaucoma, left eye, severe stage: Secondary | ICD-10-CM | POA: Diagnosis not present

## 2015-02-19 DIAGNOSIS — Z7901 Long term (current) use of anticoagulants: Secondary | ICD-10-CM | POA: Diagnosis not present

## 2015-03-20 DIAGNOSIS — Z7901 Long term (current) use of anticoagulants: Secondary | ICD-10-CM | POA: Diagnosis not present

## 2015-03-20 DIAGNOSIS — I10 Essential (primary) hypertension: Secondary | ICD-10-CM | POA: Diagnosis not present

## 2015-03-20 DIAGNOSIS — Z1389 Encounter for screening for other disorder: Secondary | ICD-10-CM | POA: Diagnosis not present

## 2015-03-20 DIAGNOSIS — Z79899 Other long term (current) drug therapy: Secondary | ICD-10-CM | POA: Diagnosis not present

## 2015-03-20 DIAGNOSIS — Z0000 Encounter for general adult medical examination without abnormal findings: Secondary | ICD-10-CM | POA: Diagnosis not present

## 2015-03-20 DIAGNOSIS — I2699 Other pulmonary embolism without acute cor pulmonale: Secondary | ICD-10-CM | POA: Diagnosis not present

## 2015-03-25 DIAGNOSIS — H401123 Primary open-angle glaucoma, left eye, severe stage: Secondary | ICD-10-CM | POA: Diagnosis not present

## 2015-04-15 DIAGNOSIS — M79605 Pain in left leg: Secondary | ICD-10-CM | POA: Diagnosis not present

## 2015-04-15 DIAGNOSIS — Z471 Aftercare following joint replacement surgery: Secondary | ICD-10-CM | POA: Diagnosis not present

## 2015-04-15 DIAGNOSIS — R2689 Other abnormalities of gait and mobility: Secondary | ICD-10-CM | POA: Diagnosis not present

## 2015-04-17 DIAGNOSIS — I10 Essential (primary) hypertension: Secondary | ICD-10-CM | POA: Diagnosis not present

## 2015-04-17 DIAGNOSIS — Z79899 Other long term (current) drug therapy: Secondary | ICD-10-CM | POA: Diagnosis not present

## 2015-04-17 DIAGNOSIS — Z7901 Long term (current) use of anticoagulants: Secondary | ICD-10-CM | POA: Diagnosis not present

## 2015-05-06 DIAGNOSIS — H401123 Primary open-angle glaucoma, left eye, severe stage: Secondary | ICD-10-CM | POA: Diagnosis not present

## 2015-05-06 DIAGNOSIS — H40022 Open angle with borderline findings, high risk, left eye: Secondary | ICD-10-CM | POA: Diagnosis not present

## 2015-05-08 DIAGNOSIS — Z7901 Long term (current) use of anticoagulants: Secondary | ICD-10-CM | POA: Diagnosis not present

## 2015-05-29 DIAGNOSIS — Z7901 Long term (current) use of anticoagulants: Secondary | ICD-10-CM | POA: Diagnosis not present

## 2015-07-05 DIAGNOSIS — Z7901 Long term (current) use of anticoagulants: Secondary | ICD-10-CM | POA: Diagnosis not present

## 2015-07-31 DIAGNOSIS — Z7901 Long term (current) use of anticoagulants: Secondary | ICD-10-CM | POA: Diagnosis not present

## 2015-08-14 DIAGNOSIS — Z7901 Long term (current) use of anticoagulants: Secondary | ICD-10-CM | POA: Diagnosis not present

## 2015-09-04 DIAGNOSIS — Z961 Presence of intraocular lens: Secondary | ICD-10-CM | POA: Diagnosis not present

## 2015-09-04 DIAGNOSIS — H2511 Age-related nuclear cataract, right eye: Secondary | ICD-10-CM | POA: Diagnosis not present

## 2015-09-04 DIAGNOSIS — Z947 Corneal transplant status: Secondary | ICD-10-CM | POA: Diagnosis not present

## 2015-09-17 DIAGNOSIS — E669 Obesity, unspecified: Secondary | ICD-10-CM | POA: Diagnosis not present

## 2015-09-17 DIAGNOSIS — I129 Hypertensive chronic kidney disease with stage 1 through stage 4 chronic kidney disease, or unspecified chronic kidney disease: Secondary | ICD-10-CM | POA: Diagnosis not present

## 2015-09-17 DIAGNOSIS — Z6834 Body mass index (BMI) 34.0-34.9, adult: Secondary | ICD-10-CM | POA: Diagnosis not present

## 2015-09-17 DIAGNOSIS — N183 Chronic kidney disease, stage 3 (moderate): Secondary | ICD-10-CM | POA: Diagnosis not present

## 2015-09-17 DIAGNOSIS — I2699 Other pulmonary embolism without acute cor pulmonale: Secondary | ICD-10-CM | POA: Diagnosis not present

## 2015-09-17 DIAGNOSIS — I10 Essential (primary) hypertension: Secondary | ICD-10-CM | POA: Diagnosis not present

## 2015-09-17 DIAGNOSIS — Z79899 Other long term (current) drug therapy: Secondary | ICD-10-CM | POA: Diagnosis not present

## 2015-09-17 DIAGNOSIS — Z7901 Long term (current) use of anticoagulants: Secondary | ICD-10-CM | POA: Diagnosis not present

## 2015-10-02 DIAGNOSIS — Z7901 Long term (current) use of anticoagulants: Secondary | ICD-10-CM | POA: Diagnosis not present

## 2015-10-02 DIAGNOSIS — Z86711 Personal history of pulmonary embolism: Secondary | ICD-10-CM | POA: Diagnosis not present

## 2015-10-02 DIAGNOSIS — I10 Essential (primary) hypertension: Secondary | ICD-10-CM | POA: Diagnosis not present

## 2015-10-02 DIAGNOSIS — Z86718 Personal history of other venous thrombosis and embolism: Secondary | ICD-10-CM | POA: Diagnosis not present

## 2015-10-02 DIAGNOSIS — H2511 Age-related nuclear cataract, right eye: Secondary | ICD-10-CM | POA: Diagnosis not present

## 2015-10-15 DIAGNOSIS — Z7901 Long term (current) use of anticoagulants: Secondary | ICD-10-CM | POA: Diagnosis not present

## 2015-10-17 DIAGNOSIS — I10 Essential (primary) hypertension: Secondary | ICD-10-CM | POA: Diagnosis not present

## 2015-10-17 DIAGNOSIS — Z7901 Long term (current) use of anticoagulants: Secondary | ICD-10-CM | POA: Diagnosis not present

## 2015-10-17 DIAGNOSIS — H25811 Combined forms of age-related cataract, right eye: Secondary | ICD-10-CM | POA: Diagnosis not present

## 2015-10-17 DIAGNOSIS — Z86718 Personal history of other venous thrombosis and embolism: Secondary | ICD-10-CM | POA: Diagnosis not present

## 2015-10-17 DIAGNOSIS — G250 Essential tremor: Secondary | ICD-10-CM | POA: Diagnosis not present

## 2015-10-17 DIAGNOSIS — Z86711 Personal history of pulmonary embolism: Secondary | ICD-10-CM | POA: Diagnosis not present

## 2015-10-17 DIAGNOSIS — K219 Gastro-esophageal reflux disease without esophagitis: Secondary | ICD-10-CM | POA: Diagnosis not present

## 2015-11-12 DIAGNOSIS — Z7901 Long term (current) use of anticoagulants: Secondary | ICD-10-CM | POA: Diagnosis not present

## 2015-11-28 DIAGNOSIS — Z23 Encounter for immunization: Secondary | ICD-10-CM | POA: Diagnosis not present

## 2015-12-03 DIAGNOSIS — Z7901 Long term (current) use of anticoagulants: Secondary | ICD-10-CM | POA: Diagnosis not present

## 2015-12-18 DIAGNOSIS — Z7901 Long term (current) use of anticoagulants: Secondary | ICD-10-CM | POA: Diagnosis not present

## 2016-01-29 DIAGNOSIS — Z7901 Long term (current) use of anticoagulants: Secondary | ICD-10-CM | POA: Diagnosis not present

## 2016-02-28 DIAGNOSIS — H26491 Other secondary cataract, right eye: Secondary | ICD-10-CM | POA: Diagnosis not present

## 2016-03-10 DIAGNOSIS — Z7901 Long term (current) use of anticoagulants: Secondary | ICD-10-CM | POA: Diagnosis not present

## 2016-03-10 DIAGNOSIS — J209 Acute bronchitis, unspecified: Secondary | ICD-10-CM | POA: Diagnosis not present

## 2016-03-13 DIAGNOSIS — Z7901 Long term (current) use of anticoagulants: Secondary | ICD-10-CM | POA: Diagnosis not present

## 2016-03-23 DIAGNOSIS — Z1389 Encounter for screening for other disorder: Secondary | ICD-10-CM | POA: Diagnosis not present

## 2016-03-23 DIAGNOSIS — Z7901 Long term (current) use of anticoagulants: Secondary | ICD-10-CM | POA: Diagnosis not present

## 2016-03-23 DIAGNOSIS — N183 Chronic kidney disease, stage 3 (moderate): Secondary | ICD-10-CM | POA: Diagnosis not present

## 2016-03-23 DIAGNOSIS — I2699 Other pulmonary embolism without acute cor pulmonale: Secondary | ICD-10-CM | POA: Diagnosis not present

## 2016-03-23 DIAGNOSIS — I129 Hypertensive chronic kidney disease with stage 1 through stage 4 chronic kidney disease, or unspecified chronic kidney disease: Secondary | ICD-10-CM | POA: Diagnosis not present

## 2016-03-23 DIAGNOSIS — Z0000 Encounter for general adult medical examination without abnormal findings: Secondary | ICD-10-CM | POA: Diagnosis not present

## 2016-03-23 DIAGNOSIS — E669 Obesity, unspecified: Secondary | ICD-10-CM | POA: Diagnosis not present

## 2016-03-23 DIAGNOSIS — Z6835 Body mass index (BMI) 35.0-35.9, adult: Secondary | ICD-10-CM | POA: Diagnosis not present

## 2016-03-23 DIAGNOSIS — K219 Gastro-esophageal reflux disease without esophagitis: Secondary | ICD-10-CM | POA: Diagnosis not present

## 2016-03-23 DIAGNOSIS — G250 Essential tremor: Secondary | ICD-10-CM | POA: Diagnosis not present

## 2016-03-27 DIAGNOSIS — Z7901 Long term (current) use of anticoagulants: Secondary | ICD-10-CM | POA: Diagnosis not present

## 2016-03-27 DIAGNOSIS — I129 Hypertensive chronic kidney disease with stage 1 through stage 4 chronic kidney disease, or unspecified chronic kidney disease: Secondary | ICD-10-CM | POA: Diagnosis not present

## 2016-03-31 DIAGNOSIS — Z7901 Long term (current) use of anticoagulants: Secondary | ICD-10-CM | POA: Diagnosis not present

## 2016-04-15 DIAGNOSIS — Z7901 Long term (current) use of anticoagulants: Secondary | ICD-10-CM | POA: Diagnosis not present

## 2016-05-06 DIAGNOSIS — Z7901 Long term (current) use of anticoagulants: Secondary | ICD-10-CM | POA: Diagnosis not present

## 2016-05-14 DIAGNOSIS — Z7901 Long term (current) use of anticoagulants: Secondary | ICD-10-CM | POA: Diagnosis not present

## 2016-05-20 DIAGNOSIS — M4300 Spondylolysis, site unspecified: Secondary | ICD-10-CM | POA: Diagnosis not present

## 2016-05-20 DIAGNOSIS — M4310 Spondylolisthesis, site unspecified: Secondary | ICD-10-CM | POA: Diagnosis not present

## 2016-05-20 DIAGNOSIS — M1612 Unilateral primary osteoarthritis, left hip: Secondary | ICD-10-CM | POA: Diagnosis not present

## 2016-05-26 DIAGNOSIS — Z7901 Long term (current) use of anticoagulants: Secondary | ICD-10-CM | POA: Diagnosis not present

## 2016-06-12 DIAGNOSIS — M4310 Spondylolisthesis, site unspecified: Secondary | ICD-10-CM | POA: Diagnosis not present

## 2016-06-12 DIAGNOSIS — M4300 Spondylolysis, site unspecified: Secondary | ICD-10-CM | POA: Diagnosis not present

## 2016-06-12 DIAGNOSIS — M1612 Unilateral primary osteoarthritis, left hip: Secondary | ICD-10-CM | POA: Diagnosis not present

## 2016-06-16 DIAGNOSIS — Z7901 Long term (current) use of anticoagulants: Secondary | ICD-10-CM | POA: Diagnosis not present

## 2016-07-01 ENCOUNTER — Ambulatory Visit (INDEPENDENT_AMBULATORY_CARE_PROVIDER_SITE_OTHER): Payer: Medicare Other | Admitting: Pharmacist

## 2016-07-01 DIAGNOSIS — M5136 Other intervertebral disc degeneration, lumbar region: Secondary | ICD-10-CM | POA: Diagnosis not present

## 2016-07-01 DIAGNOSIS — Z7901 Long term (current) use of anticoagulants: Secondary | ICD-10-CM

## 2016-07-01 DIAGNOSIS — M4307 Spondylolysis, lumbosacral region: Secondary | ICD-10-CM | POA: Diagnosis not present

## 2016-07-01 DIAGNOSIS — M4317 Spondylolisthesis, lumbosacral region: Secondary | ICD-10-CM | POA: Diagnosis not present

## 2016-07-01 LAB — POCT INR: INR: 1.1

## 2016-07-13 DIAGNOSIS — Z7901 Long term (current) use of anticoagulants: Secondary | ICD-10-CM | POA: Diagnosis not present

## 2016-08-11 DIAGNOSIS — Z7901 Long term (current) use of anticoagulants: Secondary | ICD-10-CM | POA: Diagnosis not present

## 2016-09-01 ENCOUNTER — Ambulatory Visit (INDEPENDENT_AMBULATORY_CARE_PROVIDER_SITE_OTHER): Payer: Medicare Other | Admitting: Pharmacist

## 2016-09-01 DIAGNOSIS — Z7901 Long term (current) use of anticoagulants: Secondary | ICD-10-CM | POA: Diagnosis not present

## 2016-09-01 DIAGNOSIS — M5137 Other intervertebral disc degeneration, lumbosacral region: Secondary | ICD-10-CM | POA: Diagnosis not present

## 2016-09-01 LAB — POCT INR: INR: 1

## 2016-09-16 DIAGNOSIS — Z79899 Other long term (current) drug therapy: Secondary | ICD-10-CM | POA: Diagnosis not present

## 2016-09-16 DIAGNOSIS — N183 Chronic kidney disease, stage 3 (moderate): Secondary | ICD-10-CM | POA: Diagnosis not present

## 2016-09-16 DIAGNOSIS — D649 Anemia, unspecified: Secondary | ICD-10-CM | POA: Diagnosis not present

## 2016-09-16 DIAGNOSIS — I2699 Other pulmonary embolism without acute cor pulmonale: Secondary | ICD-10-CM | POA: Diagnosis not present

## 2016-09-16 DIAGNOSIS — I129 Hypertensive chronic kidney disease with stage 1 through stage 4 chronic kidney disease, or unspecified chronic kidney disease: Secondary | ICD-10-CM | POA: Diagnosis not present

## 2016-09-16 DIAGNOSIS — G250 Essential tremor: Secondary | ICD-10-CM | POA: Diagnosis not present

## 2016-10-07 DIAGNOSIS — I2699 Other pulmonary embolism without acute cor pulmonale: Secondary | ICD-10-CM | POA: Diagnosis not present

## 2016-10-07 DIAGNOSIS — N183 Chronic kidney disease, stage 3 (moderate): Secondary | ICD-10-CM | POA: Diagnosis not present

## 2016-10-07 DIAGNOSIS — I129 Hypertensive chronic kidney disease with stage 1 through stage 4 chronic kidney disease, or unspecified chronic kidney disease: Secondary | ICD-10-CM | POA: Diagnosis not present

## 2016-10-07 DIAGNOSIS — D508 Other iron deficiency anemias: Secondary | ICD-10-CM | POA: Diagnosis not present

## 2016-10-07 DIAGNOSIS — Z7901 Long term (current) use of anticoagulants: Secondary | ICD-10-CM | POA: Diagnosis not present

## 2016-10-15 DIAGNOSIS — M1612 Unilateral primary osteoarthritis, left hip: Secondary | ICD-10-CM | POA: Diagnosis not present

## 2016-10-15 DIAGNOSIS — M4310 Spondylolisthesis, site unspecified: Secondary | ICD-10-CM | POA: Diagnosis not present

## 2016-10-15 DIAGNOSIS — M4300 Spondylolysis, site unspecified: Secondary | ICD-10-CM | POA: Diagnosis not present

## 2016-10-17 DIAGNOSIS — M4316 Spondylolisthesis, lumbar region: Secondary | ICD-10-CM | POA: Diagnosis not present

## 2016-10-17 DIAGNOSIS — M4306 Spondylolysis, lumbar region: Secondary | ICD-10-CM | POA: Diagnosis not present

## 2016-10-21 DIAGNOSIS — Z7901 Long term (current) use of anticoagulants: Secondary | ICD-10-CM | POA: Diagnosis not present

## 2016-10-21 DIAGNOSIS — Z79899 Other long term (current) drug therapy: Secondary | ICD-10-CM | POA: Diagnosis not present

## 2016-11-03 DIAGNOSIS — Z7901 Long term (current) use of anticoagulants: Secondary | ICD-10-CM | POA: Diagnosis not present

## 2016-11-17 ENCOUNTER — Ambulatory Visit (INDEPENDENT_AMBULATORY_CARE_PROVIDER_SITE_OTHER): Payer: Medicare Other | Admitting: Pharmacist Clinician (PhC)/ Clinical Pharmacy Specialist

## 2016-11-17 DIAGNOSIS — I82402 Acute embolism and thrombosis of unspecified deep veins of left lower extremity: Secondary | ICD-10-CM

## 2016-11-17 DIAGNOSIS — Z7901 Long term (current) use of anticoagulants: Secondary | ICD-10-CM

## 2016-11-17 DIAGNOSIS — M545 Low back pain: Secondary | ICD-10-CM | POA: Diagnosis not present

## 2016-11-17 LAB — POCT INR: INR: 1.1

## 2016-11-24 DIAGNOSIS — Z7901 Long term (current) use of anticoagulants: Secondary | ICD-10-CM | POA: Diagnosis not present

## 2016-12-01 DIAGNOSIS — M4310 Spondylolisthesis, site unspecified: Secondary | ICD-10-CM | POA: Diagnosis not present

## 2016-12-01 DIAGNOSIS — M4300 Spondylolysis, site unspecified: Secondary | ICD-10-CM | POA: Diagnosis not present

## 2016-12-08 DIAGNOSIS — Z7901 Long term (current) use of anticoagulants: Secondary | ICD-10-CM | POA: Diagnosis not present

## 2017-01-06 DIAGNOSIS — Z7901 Long term (current) use of anticoagulants: Secondary | ICD-10-CM | POA: Diagnosis not present

## 2017-01-19 DIAGNOSIS — Z7901 Long term (current) use of anticoagulants: Secondary | ICD-10-CM | POA: Diagnosis not present

## 2017-01-26 DIAGNOSIS — M4310 Spondylolisthesis, site unspecified: Secondary | ICD-10-CM | POA: Diagnosis not present

## 2017-01-26 DIAGNOSIS — M4300 Spondylolysis, site unspecified: Secondary | ICD-10-CM | POA: Diagnosis not present

## 2017-01-26 DIAGNOSIS — M5136 Other intervertebral disc degeneration, lumbar region: Secondary | ICD-10-CM | POA: Diagnosis not present

## 2017-02-18 DIAGNOSIS — Z7901 Long term (current) use of anticoagulants: Secondary | ICD-10-CM | POA: Diagnosis not present

## 2017-03-16 DIAGNOSIS — K219 Gastro-esophageal reflux disease without esophagitis: Secondary | ICD-10-CM | POA: Diagnosis not present

## 2017-03-16 DIAGNOSIS — I10 Essential (primary) hypertension: Secondary | ICD-10-CM | POA: Diagnosis not present

## 2017-03-16 DIAGNOSIS — Z7901 Long term (current) use of anticoagulants: Secondary | ICD-10-CM | POA: Diagnosis not present

## 2017-04-06 DIAGNOSIS — Z79899 Other long term (current) drug therapy: Secondary | ICD-10-CM | POA: Diagnosis not present

## 2017-04-06 DIAGNOSIS — Z6834 Body mass index (BMI) 34.0-34.9, adult: Secondary | ICD-10-CM | POA: Diagnosis not present

## 2017-04-06 DIAGNOSIS — E669 Obesity, unspecified: Secondary | ICD-10-CM | POA: Diagnosis not present

## 2017-04-06 DIAGNOSIS — I129 Hypertensive chronic kidney disease with stage 1 through stage 4 chronic kidney disease, or unspecified chronic kidney disease: Secondary | ICD-10-CM | POA: Diagnosis not present

## 2017-04-06 DIAGNOSIS — L989 Disorder of the skin and subcutaneous tissue, unspecified: Secondary | ICD-10-CM | POA: Diagnosis not present

## 2017-04-06 DIAGNOSIS — Z7901 Long term (current) use of anticoagulants: Secondary | ICD-10-CM | POA: Diagnosis not present

## 2017-04-06 DIAGNOSIS — K219 Gastro-esophageal reflux disease without esophagitis: Secondary | ICD-10-CM | POA: Diagnosis not present

## 2017-04-06 DIAGNOSIS — Z23 Encounter for immunization: Secondary | ICD-10-CM | POA: Diagnosis not present

## 2017-04-06 DIAGNOSIS — Z1389 Encounter for screening for other disorder: Secondary | ICD-10-CM | POA: Diagnosis not present

## 2017-04-06 DIAGNOSIS — N183 Chronic kidney disease, stage 3 (moderate): Secondary | ICD-10-CM | POA: Diagnosis not present

## 2017-04-06 DIAGNOSIS — Z0000 Encounter for general adult medical examination without abnormal findings: Secondary | ICD-10-CM | POA: Diagnosis not present

## 2017-04-06 DIAGNOSIS — I2699 Other pulmonary embolism without acute cor pulmonale: Secondary | ICD-10-CM | POA: Diagnosis not present

## 2017-04-30 DIAGNOSIS — Z7901 Long term (current) use of anticoagulants: Secondary | ICD-10-CM | POA: Diagnosis not present

## 2017-05-06 DIAGNOSIS — M5416 Radiculopathy, lumbar region: Secondary | ICD-10-CM | POA: Diagnosis not present

## 2017-05-13 DIAGNOSIS — Z7901 Long term (current) use of anticoagulants: Secondary | ICD-10-CM | POA: Diagnosis not present

## 2017-05-20 DIAGNOSIS — M5416 Radiculopathy, lumbar region: Secondary | ICD-10-CM | POA: Diagnosis not present

## 2017-05-27 DIAGNOSIS — Z7901 Long term (current) use of anticoagulants: Secondary | ICD-10-CM | POA: Diagnosis not present

## 2017-06-24 DIAGNOSIS — Z7901 Long term (current) use of anticoagulants: Secondary | ICD-10-CM | POA: Diagnosis not present

## 2017-07-22 DIAGNOSIS — Z7901 Long term (current) use of anticoagulants: Secondary | ICD-10-CM | POA: Diagnosis not present

## 2017-08-10 DIAGNOSIS — Z7901 Long term (current) use of anticoagulants: Secondary | ICD-10-CM | POA: Diagnosis not present

## 2017-08-24 DIAGNOSIS — Z7901 Long term (current) use of anticoagulants: Secondary | ICD-10-CM | POA: Diagnosis not present

## 2017-09-01 DIAGNOSIS — Z9841 Cataract extraction status, right eye: Secondary | ICD-10-CM | POA: Diagnosis not present

## 2017-09-01 DIAGNOSIS — Z947 Corneal transplant status: Secondary | ICD-10-CM | POA: Diagnosis not present

## 2017-09-01 DIAGNOSIS — Z961 Presence of intraocular lens: Secondary | ICD-10-CM | POA: Diagnosis not present

## 2017-09-14 DIAGNOSIS — Z7901 Long term (current) use of anticoagulants: Secondary | ICD-10-CM | POA: Diagnosis not present

## 2017-09-22 DIAGNOSIS — Z7901 Long term (current) use of anticoagulants: Secondary | ICD-10-CM | POA: Diagnosis not present

## 2017-10-21 DIAGNOSIS — Z7901 Long term (current) use of anticoagulants: Secondary | ICD-10-CM | POA: Diagnosis not present

## 2017-11-18 DIAGNOSIS — Z7901 Long term (current) use of anticoagulants: Secondary | ICD-10-CM | POA: Diagnosis not present

## 2017-12-02 DIAGNOSIS — Z23 Encounter for immunization: Secondary | ICD-10-CM | POA: Diagnosis not present

## 2017-12-09 DIAGNOSIS — Z7901 Long term (current) use of anticoagulants: Secondary | ICD-10-CM | POA: Diagnosis not present

## 2017-12-16 DIAGNOSIS — Z7901 Long term (current) use of anticoagulants: Secondary | ICD-10-CM | POA: Diagnosis not present

## 2018-01-06 DIAGNOSIS — Z7901 Long term (current) use of anticoagulants: Secondary | ICD-10-CM | POA: Diagnosis not present

## 2018-01-27 DIAGNOSIS — Z7901 Long term (current) use of anticoagulants: Secondary | ICD-10-CM | POA: Diagnosis not present

## 2018-02-17 DIAGNOSIS — Z7901 Long term (current) use of anticoagulants: Secondary | ICD-10-CM | POA: Diagnosis not present

## 2018-03-02 DIAGNOSIS — Z961 Presence of intraocular lens: Secondary | ICD-10-CM | POA: Diagnosis not present

## 2018-03-02 DIAGNOSIS — Z9841 Cataract extraction status, right eye: Secondary | ICD-10-CM | POA: Diagnosis not present

## 2018-03-02 DIAGNOSIS — Z947 Corneal transplant status: Secondary | ICD-10-CM | POA: Diagnosis not present

## 2018-03-17 DIAGNOSIS — Z7901 Long term (current) use of anticoagulants: Secondary | ICD-10-CM | POA: Diagnosis not present

## 2018-03-31 DIAGNOSIS — Z7901 Long term (current) use of anticoagulants: Secondary | ICD-10-CM | POA: Diagnosis not present

## 2018-04-28 DIAGNOSIS — Z7901 Long term (current) use of anticoagulants: Secondary | ICD-10-CM | POA: Diagnosis not present

## 2018-05-27 DIAGNOSIS — Z7901 Long term (current) use of anticoagulants: Secondary | ICD-10-CM | POA: Diagnosis not present

## 2018-06-24 DIAGNOSIS — Z7901 Long term (current) use of anticoagulants: Secondary | ICD-10-CM | POA: Diagnosis not present

## 2018-07-26 DIAGNOSIS — Z7901 Long term (current) use of anticoagulants: Secondary | ICD-10-CM | POA: Diagnosis not present

## 2018-08-15 DIAGNOSIS — K219 Gastro-esophageal reflux disease without esophagitis: Secondary | ICD-10-CM | POA: Diagnosis not present

## 2018-08-15 DIAGNOSIS — N183 Chronic kidney disease, stage 3 (moderate): Secondary | ICD-10-CM | POA: Diagnosis not present

## 2018-08-15 DIAGNOSIS — I129 Hypertensive chronic kidney disease with stage 1 through stage 4 chronic kidney disease, or unspecified chronic kidney disease: Secondary | ICD-10-CM | POA: Diagnosis not present

## 2018-08-16 DIAGNOSIS — Z7901 Long term (current) use of anticoagulants: Secondary | ICD-10-CM | POA: Diagnosis not present

## 2018-09-06 DIAGNOSIS — Z7901 Long term (current) use of anticoagulants: Secondary | ICD-10-CM | POA: Diagnosis not present

## 2018-09-16 DIAGNOSIS — I2699 Other pulmonary embolism without acute cor pulmonale: Secondary | ICD-10-CM | POA: Diagnosis not present

## 2018-09-16 DIAGNOSIS — I129 Hypertensive chronic kidney disease with stage 1 through stage 4 chronic kidney disease, or unspecified chronic kidney disease: Secondary | ICD-10-CM | POA: Diagnosis not present

## 2018-09-16 DIAGNOSIS — D508 Other iron deficiency anemias: Secondary | ICD-10-CM | POA: Diagnosis not present

## 2018-09-16 DIAGNOSIS — N183 Chronic kidney disease, stage 3 (moderate): Secondary | ICD-10-CM | POA: Diagnosis not present

## 2018-10-04 DIAGNOSIS — D508 Other iron deficiency anemias: Secondary | ICD-10-CM | POA: Diagnosis not present

## 2018-10-04 DIAGNOSIS — N183 Chronic kidney disease, stage 3 (moderate): Secondary | ICD-10-CM | POA: Diagnosis not present

## 2018-10-04 DIAGNOSIS — Z7901 Long term (current) use of anticoagulants: Secondary | ICD-10-CM | POA: Diagnosis not present

## 2018-10-04 DIAGNOSIS — R7309 Other abnormal glucose: Secondary | ICD-10-CM | POA: Diagnosis not present

## 2018-10-26 DIAGNOSIS — Z7901 Long term (current) use of anticoagulants: Secondary | ICD-10-CM | POA: Diagnosis not present

## 2018-11-21 DIAGNOSIS — Z23 Encounter for immunization: Secondary | ICD-10-CM | POA: Diagnosis not present

## 2018-11-21 DIAGNOSIS — Z7901 Long term (current) use of anticoagulants: Secondary | ICD-10-CM | POA: Diagnosis not present

## 2018-12-20 DIAGNOSIS — N1831 Chronic kidney disease, stage 3a: Secondary | ICD-10-CM | POA: Diagnosis not present

## 2018-12-20 DIAGNOSIS — Z7901 Long term (current) use of anticoagulants: Secondary | ICD-10-CM | POA: Diagnosis not present

## 2018-12-20 DIAGNOSIS — I129 Hypertensive chronic kidney disease with stage 1 through stage 4 chronic kidney disease, or unspecified chronic kidney disease: Secondary | ICD-10-CM | POA: Diagnosis not present

## 2018-12-20 DIAGNOSIS — I2699 Other pulmonary embolism without acute cor pulmonale: Secondary | ICD-10-CM | POA: Diagnosis not present

## 2019-01-17 DIAGNOSIS — N1831 Chronic kidney disease, stage 3a: Secondary | ICD-10-CM | POA: Diagnosis not present

## 2019-01-17 DIAGNOSIS — Z7901 Long term (current) use of anticoagulants: Secondary | ICD-10-CM | POA: Diagnosis not present

## 2019-01-17 DIAGNOSIS — Z1389 Encounter for screening for other disorder: Secondary | ICD-10-CM | POA: Diagnosis not present

## 2019-01-17 DIAGNOSIS — I129 Hypertensive chronic kidney disease with stage 1 through stage 4 chronic kidney disease, or unspecified chronic kidney disease: Secondary | ICD-10-CM | POA: Diagnosis not present

## 2019-03-10 DIAGNOSIS — Z23 Encounter for immunization: Secondary | ICD-10-CM | POA: Diagnosis not present

## 2019-03-14 DIAGNOSIS — Z7901 Long term (current) use of anticoagulants: Secondary | ICD-10-CM | POA: Diagnosis not present

## 2019-04-07 DIAGNOSIS — Z23 Encounter for immunization: Secondary | ICD-10-CM | POA: Diagnosis not present

## 2019-04-11 DIAGNOSIS — Z7901 Long term (current) use of anticoagulants: Secondary | ICD-10-CM | POA: Diagnosis not present

## 2019-05-03 DIAGNOSIS — Z961 Presence of intraocular lens: Secondary | ICD-10-CM | POA: Diagnosis not present

## 2019-05-03 DIAGNOSIS — Z9841 Cataract extraction status, right eye: Secondary | ICD-10-CM | POA: Diagnosis not present

## 2019-05-03 DIAGNOSIS — Z947 Corneal transplant status: Secondary | ICD-10-CM | POA: Diagnosis not present

## 2019-05-09 DIAGNOSIS — Z7901 Long term (current) use of anticoagulants: Secondary | ICD-10-CM | POA: Diagnosis not present

## 2019-05-17 DIAGNOSIS — R7303 Prediabetes: Secondary | ICD-10-CM | POA: Diagnosis not present

## 2019-05-17 DIAGNOSIS — Z1389 Encounter for screening for other disorder: Secondary | ICD-10-CM | POA: Diagnosis not present

## 2019-05-17 DIAGNOSIS — Z79899 Other long term (current) drug therapy: Secondary | ICD-10-CM | POA: Diagnosis not present

## 2019-05-17 DIAGNOSIS — N1831 Chronic kidney disease, stage 3a: Secondary | ICD-10-CM | POA: Diagnosis not present

## 2019-05-17 DIAGNOSIS — Z0000 Encounter for general adult medical examination without abnormal findings: Secondary | ICD-10-CM | POA: Diagnosis not present

## 2019-05-17 DIAGNOSIS — K909 Intestinal malabsorption, unspecified: Secondary | ICD-10-CM | POA: Diagnosis not present

## 2019-05-17 DIAGNOSIS — G250 Essential tremor: Secondary | ICD-10-CM | POA: Diagnosis not present

## 2019-05-17 DIAGNOSIS — I2699 Other pulmonary embolism without acute cor pulmonale: Secondary | ICD-10-CM | POA: Diagnosis not present

## 2019-05-17 DIAGNOSIS — I129 Hypertensive chronic kidney disease with stage 1 through stage 4 chronic kidney disease, or unspecified chronic kidney disease: Secondary | ICD-10-CM | POA: Diagnosis not present

## 2019-05-17 DIAGNOSIS — K219 Gastro-esophageal reflux disease without esophagitis: Secondary | ICD-10-CM | POA: Diagnosis not present

## 2019-06-06 DIAGNOSIS — Z7901 Long term (current) use of anticoagulants: Secondary | ICD-10-CM | POA: Diagnosis not present

## 2019-06-27 DIAGNOSIS — Z7901 Long term (current) use of anticoagulants: Secondary | ICD-10-CM | POA: Diagnosis not present

## 2019-07-04 DIAGNOSIS — Z7901 Long term (current) use of anticoagulants: Secondary | ICD-10-CM | POA: Diagnosis not present

## 2019-07-11 DIAGNOSIS — Z7901 Long term (current) use of anticoagulants: Secondary | ICD-10-CM | POA: Diagnosis not present

## 2019-08-01 DIAGNOSIS — Z7901 Long term (current) use of anticoagulants: Secondary | ICD-10-CM | POA: Diagnosis not present

## 2019-10-03 DIAGNOSIS — Z7901 Long term (current) use of anticoagulants: Secondary | ICD-10-CM | POA: Diagnosis not present

## 2019-10-24 DIAGNOSIS — Z7901 Long term (current) use of anticoagulants: Secondary | ICD-10-CM | POA: Diagnosis not present

## 2019-11-21 DIAGNOSIS — L989 Disorder of the skin and subcutaneous tissue, unspecified: Secondary | ICD-10-CM | POA: Diagnosis not present

## 2019-11-21 DIAGNOSIS — Z23 Encounter for immunization: Secondary | ICD-10-CM | POA: Diagnosis not present

## 2019-11-21 DIAGNOSIS — Z7901 Long term (current) use of anticoagulants: Secondary | ICD-10-CM | POA: Diagnosis not present

## 2019-11-21 DIAGNOSIS — N1831 Chronic kidney disease, stage 3a: Secondary | ICD-10-CM | POA: Diagnosis not present

## 2019-11-21 DIAGNOSIS — E1121 Type 2 diabetes mellitus with diabetic nephropathy: Secondary | ICD-10-CM | POA: Diagnosis not present

## 2019-11-21 DIAGNOSIS — I2699 Other pulmonary embolism without acute cor pulmonale: Secondary | ICD-10-CM | POA: Diagnosis not present

## 2019-11-21 DIAGNOSIS — I129 Hypertensive chronic kidney disease with stage 1 through stage 4 chronic kidney disease, or unspecified chronic kidney disease: Secondary | ICD-10-CM | POA: Diagnosis not present

## 2019-11-21 DIAGNOSIS — R7303 Prediabetes: Secondary | ICD-10-CM | POA: Diagnosis not present

## 2019-12-06 DIAGNOSIS — L821 Other seborrheic keratosis: Secondary | ICD-10-CM | POA: Diagnosis not present

## 2019-12-06 DIAGNOSIS — D485 Neoplasm of uncertain behavior of skin: Secondary | ICD-10-CM | POA: Diagnosis not present

## 2019-12-06 DIAGNOSIS — L565 Disseminated superficial actinic porokeratosis (DSAP): Secondary | ICD-10-CM | POA: Diagnosis not present

## 2019-12-06 DIAGNOSIS — D0472 Carcinoma in situ of skin of left lower limb, including hip: Secondary | ICD-10-CM | POA: Diagnosis not present

## 2019-12-06 DIAGNOSIS — D0439 Carcinoma in situ of skin of other parts of face: Secondary | ICD-10-CM | POA: Diagnosis not present

## 2019-12-06 DIAGNOSIS — L817 Pigmented purpuric dermatosis: Secondary | ICD-10-CM | POA: Diagnosis not present

## 2019-12-06 DIAGNOSIS — C44311 Basal cell carcinoma of skin of nose: Secondary | ICD-10-CM | POA: Diagnosis not present

## 2019-12-18 DIAGNOSIS — Z7901 Long term (current) use of anticoagulants: Secondary | ICD-10-CM | POA: Diagnosis not present

## 2019-12-25 DIAGNOSIS — H401123 Primary open-angle glaucoma, left eye, severe stage: Secondary | ICD-10-CM | POA: Diagnosis not present

## 2019-12-25 DIAGNOSIS — H40021 Open angle with borderline findings, high risk, right eye: Secondary | ICD-10-CM | POA: Diagnosis not present

## 2019-12-26 DIAGNOSIS — Z23 Encounter for immunization: Secondary | ICD-10-CM | POA: Diagnosis not present

## 2020-01-02 DIAGNOSIS — C44311 Basal cell carcinoma of skin of nose: Secondary | ICD-10-CM | POA: Diagnosis not present

## 2020-01-02 DIAGNOSIS — Z85828 Personal history of other malignant neoplasm of skin: Secondary | ICD-10-CM | POA: Diagnosis not present

## 2020-01-23 DIAGNOSIS — N1831 Chronic kidney disease, stage 3a: Secondary | ICD-10-CM | POA: Diagnosis not present

## 2020-01-23 DIAGNOSIS — E1121 Type 2 diabetes mellitus with diabetic nephropathy: Secondary | ICD-10-CM | POA: Diagnosis not present

## 2020-01-23 DIAGNOSIS — Z7901 Long term (current) use of anticoagulants: Secondary | ICD-10-CM | POA: Diagnosis not present

## 2020-01-23 DIAGNOSIS — Z4889 Encounter for other specified surgical aftercare: Secondary | ICD-10-CM | POA: Diagnosis not present

## 2020-01-23 DIAGNOSIS — I129 Hypertensive chronic kidney disease with stage 1 through stage 4 chronic kidney disease, or unspecified chronic kidney disease: Secondary | ICD-10-CM | POA: Diagnosis not present

## 2020-02-20 DIAGNOSIS — Z79899 Other long term (current) drug therapy: Secondary | ICD-10-CM | POA: Diagnosis not present

## 2020-02-20 DIAGNOSIS — Z7901 Long term (current) use of anticoagulants: Secondary | ICD-10-CM | POA: Diagnosis not present

## 2020-02-28 DIAGNOSIS — Z7901 Long term (current) use of anticoagulants: Secondary | ICD-10-CM | POA: Diagnosis not present

## 2020-03-14 DIAGNOSIS — Z7901 Long term (current) use of anticoagulants: Secondary | ICD-10-CM | POA: Diagnosis not present

## 2020-04-09 DIAGNOSIS — Z7901 Long term (current) use of anticoagulants: Secondary | ICD-10-CM | POA: Diagnosis not present

## 2020-04-18 DIAGNOSIS — Z7901 Long term (current) use of anticoagulants: Secondary | ICD-10-CM | POA: Diagnosis not present

## 2020-04-30 DIAGNOSIS — Z79899 Other long term (current) drug therapy: Secondary | ICD-10-CM | POA: Diagnosis not present

## 2020-04-30 DIAGNOSIS — Z7901 Long term (current) use of anticoagulants: Secondary | ICD-10-CM | POA: Diagnosis not present

## 2020-05-21 DIAGNOSIS — I2699 Other pulmonary embolism without acute cor pulmonale: Secondary | ICD-10-CM | POA: Diagnosis not present

## 2020-05-21 DIAGNOSIS — I129 Hypertensive chronic kidney disease with stage 1 through stage 4 chronic kidney disease, or unspecified chronic kidney disease: Secondary | ICD-10-CM | POA: Diagnosis not present

## 2020-05-21 DIAGNOSIS — K9089 Other intestinal malabsorption: Secondary | ICD-10-CM | POA: Diagnosis not present

## 2020-05-21 DIAGNOSIS — Z0000 Encounter for general adult medical examination without abnormal findings: Secondary | ICD-10-CM | POA: Diagnosis not present

## 2020-05-21 DIAGNOSIS — K219 Gastro-esophageal reflux disease without esophagitis: Secondary | ICD-10-CM | POA: Diagnosis not present

## 2020-05-21 DIAGNOSIS — Z1389 Encounter for screening for other disorder: Secondary | ICD-10-CM | POA: Diagnosis not present

## 2020-05-21 DIAGNOSIS — E1121 Type 2 diabetes mellitus with diabetic nephropathy: Secondary | ICD-10-CM | POA: Diagnosis not present

## 2020-05-21 DIAGNOSIS — Z79899 Other long term (current) drug therapy: Secondary | ICD-10-CM | POA: Diagnosis not present

## 2020-05-21 DIAGNOSIS — K909 Intestinal malabsorption, unspecified: Secondary | ICD-10-CM | POA: Diagnosis not present

## 2020-05-21 DIAGNOSIS — N1831 Chronic kidney disease, stage 3a: Secondary | ICD-10-CM | POA: Diagnosis not present

## 2020-07-02 DIAGNOSIS — Z7901 Long term (current) use of anticoagulants: Secondary | ICD-10-CM | POA: Diagnosis not present

## 2020-07-03 DIAGNOSIS — H401123 Primary open-angle glaucoma, left eye, severe stage: Secondary | ICD-10-CM | POA: Diagnosis not present

## 2020-07-03 DIAGNOSIS — Z947 Corneal transplant status: Secondary | ICD-10-CM | POA: Diagnosis not present

## 2020-07-03 DIAGNOSIS — H40021 Open angle with borderline findings, high risk, right eye: Secondary | ICD-10-CM | POA: Diagnosis not present

## 2020-07-03 DIAGNOSIS — Z961 Presence of intraocular lens: Secondary | ICD-10-CM | POA: Diagnosis not present

## 2020-07-23 DIAGNOSIS — Z7901 Long term (current) use of anticoagulants: Secondary | ICD-10-CM | POA: Diagnosis not present

## 2020-08-06 DIAGNOSIS — Z7901 Long term (current) use of anticoagulants: Secondary | ICD-10-CM | POA: Diagnosis not present

## 2020-09-03 DIAGNOSIS — I129 Hypertensive chronic kidney disease with stage 1 through stage 4 chronic kidney disease, or unspecified chronic kidney disease: Secondary | ICD-10-CM | POA: Diagnosis not present

## 2020-09-03 DIAGNOSIS — N1831 Chronic kidney disease, stage 3a: Secondary | ICD-10-CM | POA: Diagnosis not present

## 2020-09-03 DIAGNOSIS — Z86711 Personal history of pulmonary embolism: Secondary | ICD-10-CM | POA: Diagnosis not present

## 2020-09-03 DIAGNOSIS — Z7901 Long term (current) use of anticoagulants: Secondary | ICD-10-CM | POA: Diagnosis not present

## 2020-10-08 DIAGNOSIS — Z7901 Long term (current) use of anticoagulants: Secondary | ICD-10-CM | POA: Diagnosis not present

## 2020-10-08 DIAGNOSIS — Z79899 Other long term (current) drug therapy: Secondary | ICD-10-CM | POA: Diagnosis not present

## 2020-10-30 DIAGNOSIS — Z23 Encounter for immunization: Secondary | ICD-10-CM | POA: Diagnosis not present

## 2020-11-12 DIAGNOSIS — Z7901 Long term (current) use of anticoagulants: Secondary | ICD-10-CM | POA: Diagnosis not present

## 2020-12-17 DIAGNOSIS — Z7901 Long term (current) use of anticoagulants: Secondary | ICD-10-CM | POA: Diagnosis not present

## 2021-01-06 DIAGNOSIS — I872 Venous insufficiency (chronic) (peripheral): Secondary | ICD-10-CM | POA: Diagnosis not present

## 2021-01-06 DIAGNOSIS — I8312 Varicose veins of left lower extremity with inflammation: Secondary | ICD-10-CM | POA: Diagnosis not present

## 2021-01-06 DIAGNOSIS — I8311 Varicose veins of right lower extremity with inflammation: Secondary | ICD-10-CM | POA: Diagnosis not present

## 2021-01-06 DIAGNOSIS — L821 Other seborrheic keratosis: Secondary | ICD-10-CM | POA: Diagnosis not present

## 2021-01-06 DIAGNOSIS — Z85828 Personal history of other malignant neoplasm of skin: Secondary | ICD-10-CM | POA: Diagnosis not present

## 2021-01-06 DIAGNOSIS — L84 Corns and callosities: Secondary | ICD-10-CM | POA: Diagnosis not present

## 2021-01-06 DIAGNOSIS — L570 Actinic keratosis: Secondary | ICD-10-CM | POA: Diagnosis not present

## 2021-01-21 DIAGNOSIS — Z7901 Long term (current) use of anticoagulants: Secondary | ICD-10-CM | POA: Diagnosis not present

## 2021-02-25 DIAGNOSIS — Z7901 Long term (current) use of anticoagulants: Secondary | ICD-10-CM | POA: Diagnosis not present

## 2021-04-08 DIAGNOSIS — Z7901 Long term (current) use of anticoagulants: Secondary | ICD-10-CM | POA: Diagnosis not present

## 2021-05-06 DIAGNOSIS — Z7901 Long term (current) use of anticoagulants: Secondary | ICD-10-CM | POA: Diagnosis not present

## 2021-05-23 DIAGNOSIS — Z6835 Body mass index (BMI) 35.0-35.9, adult: Secondary | ICD-10-CM | POA: Diagnosis not present

## 2021-05-23 DIAGNOSIS — I2699 Other pulmonary embolism without acute cor pulmonale: Secondary | ICD-10-CM | POA: Diagnosis not present

## 2021-05-23 DIAGNOSIS — Z0000 Encounter for general adult medical examination without abnormal findings: Secondary | ICD-10-CM | POA: Diagnosis not present

## 2021-05-23 DIAGNOSIS — N1831 Chronic kidney disease, stage 3a: Secondary | ICD-10-CM | POA: Diagnosis not present

## 2021-05-23 DIAGNOSIS — Z79899 Other long term (current) drug therapy: Secondary | ICD-10-CM | POA: Diagnosis not present

## 2021-05-23 DIAGNOSIS — K219 Gastro-esophageal reflux disease without esophagitis: Secondary | ICD-10-CM | POA: Diagnosis not present

## 2021-05-23 DIAGNOSIS — R7303 Prediabetes: Secondary | ICD-10-CM | POA: Diagnosis not present

## 2021-05-23 DIAGNOSIS — G250 Essential tremor: Secondary | ICD-10-CM | POA: Diagnosis not present

## 2021-05-23 DIAGNOSIS — I129 Hypertensive chronic kidney disease with stage 1 through stage 4 chronic kidney disease, or unspecified chronic kidney disease: Secondary | ICD-10-CM | POA: Diagnosis not present

## 2021-05-23 DIAGNOSIS — Z1389 Encounter for screening for other disorder: Secondary | ICD-10-CM | POA: Diagnosis not present

## 2021-05-23 DIAGNOSIS — N1832 Chronic kidney disease, stage 3b: Secondary | ICD-10-CM | POA: Diagnosis not present

## 2021-05-23 DIAGNOSIS — E1121 Type 2 diabetes mellitus with diabetic nephropathy: Secondary | ICD-10-CM | POA: Diagnosis not present

## 2021-05-23 DIAGNOSIS — K909 Intestinal malabsorption, unspecified: Secondary | ICD-10-CM | POA: Diagnosis not present

## 2021-06-10 DIAGNOSIS — Z7901 Long term (current) use of anticoagulants: Secondary | ICD-10-CM | POA: Diagnosis not present

## 2021-07-15 DIAGNOSIS — Z7901 Long term (current) use of anticoagulants: Secondary | ICD-10-CM | POA: Diagnosis not present

## 2021-07-30 DIAGNOSIS — H401123 Primary open-angle glaucoma, left eye, severe stage: Secondary | ICD-10-CM | POA: Diagnosis not present

## 2021-07-30 DIAGNOSIS — Z947 Corneal transplant status: Secondary | ICD-10-CM | POA: Diagnosis not present

## 2021-07-30 DIAGNOSIS — H40021 Open angle with borderline findings, high risk, right eye: Secondary | ICD-10-CM | POA: Diagnosis not present

## 2021-07-30 DIAGNOSIS — Z961 Presence of intraocular lens: Secondary | ICD-10-CM | POA: Diagnosis not present

## 2021-08-19 DIAGNOSIS — Z7901 Long term (current) use of anticoagulants: Secondary | ICD-10-CM | POA: Diagnosis not present

## 2021-09-09 DIAGNOSIS — Z7901 Long term (current) use of anticoagulants: Secondary | ICD-10-CM | POA: Diagnosis not present

## 2021-10-07 DIAGNOSIS — I2699 Other pulmonary embolism without acute cor pulmonale: Secondary | ICD-10-CM | POA: Diagnosis not present

## 2021-10-07 DIAGNOSIS — Z79899 Other long term (current) drug therapy: Secondary | ICD-10-CM | POA: Diagnosis not present

## 2021-10-07 DIAGNOSIS — I129 Hypertensive chronic kidney disease with stage 1 through stage 4 chronic kidney disease, or unspecified chronic kidney disease: Secondary | ICD-10-CM | POA: Diagnosis not present

## 2021-10-07 DIAGNOSIS — N1832 Chronic kidney disease, stage 3b: Secondary | ICD-10-CM | POA: Diagnosis not present

## 2021-10-07 DIAGNOSIS — Z7901 Long term (current) use of anticoagulants: Secondary | ICD-10-CM | POA: Diagnosis not present

## 2021-10-24 DIAGNOSIS — I129 Hypertensive chronic kidney disease with stage 1 through stage 4 chronic kidney disease, or unspecified chronic kidney disease: Secondary | ICD-10-CM | POA: Diagnosis not present

## 2021-10-24 DIAGNOSIS — I480 Paroxysmal atrial fibrillation: Secondary | ICD-10-CM | POA: Diagnosis not present

## 2021-10-24 DIAGNOSIS — E1121 Type 2 diabetes mellitus with diabetic nephropathy: Secondary | ICD-10-CM | POA: Diagnosis not present

## 2021-10-24 DIAGNOSIS — N1832 Chronic kidney disease, stage 3b: Secondary | ICD-10-CM | POA: Diagnosis not present

## 2021-10-24 DIAGNOSIS — R0689 Other abnormalities of breathing: Secondary | ICD-10-CM | POA: Diagnosis not present

## 2021-10-24 DIAGNOSIS — R0600 Dyspnea, unspecified: Secondary | ICD-10-CM | POA: Diagnosis not present

## 2021-11-04 ENCOUNTER — Ambulatory Visit (HOSPITAL_BASED_OUTPATIENT_CLINIC_OR_DEPARTMENT_OTHER)
Admission: RE | Admit: 2021-11-04 | Discharge: 2021-11-04 | Disposition: A | Discharge status: Home / Self Care | Payer: Medicare Other | Source: Ambulatory Visit | Attending: Nurse Practitioner | Admitting: Nurse Practitioner

## 2021-11-04 ENCOUNTER — Emergency Department (HOSPITAL_COMMUNITY): Payer: Medicare Other

## 2021-11-04 ENCOUNTER — Encounter (HOSPITAL_COMMUNITY): Payer: Self-pay | Admitting: Nurse Practitioner

## 2021-11-04 ENCOUNTER — Inpatient Hospital Stay (HOSPITAL_COMMUNITY)
Admission: EM | Admit: 2021-11-04 | Discharge: 2021-11-07 | DRG: 291 | Disposition: A | Discharge status: Home / Self Care | Payer: Medicare Other | Source: Ambulatory Visit | Attending: Internal Medicine | Admitting: Internal Medicine

## 2021-11-04 ENCOUNTER — Encounter (HOSPITAL_COMMUNITY): Payer: Self-pay | Admitting: Emergency Medicine

## 2021-11-04 ENCOUNTER — Inpatient Hospital Stay (HOSPITAL_COMMUNITY): Payer: Medicare Other

## 2021-11-04 VITALS — HR 109 | Ht 65.0 in | Wt 210.4 lb

## 2021-11-04 DIAGNOSIS — R0602 Shortness of breath: Secondary | ICD-10-CM

## 2021-11-04 DIAGNOSIS — Z6835 Body mass index (BMI) 35.0-35.9, adult: Secondary | ICD-10-CM

## 2021-11-04 DIAGNOSIS — Z7901 Long term (current) use of anticoagulants: Secondary | ICD-10-CM

## 2021-11-04 DIAGNOSIS — I480 Paroxysmal atrial fibrillation: Secondary | ICD-10-CM | POA: Diagnosis present

## 2021-11-04 DIAGNOSIS — I4891 Unspecified atrial fibrillation: Secondary | ICD-10-CM | POA: Diagnosis not present

## 2021-11-04 DIAGNOSIS — C3490 Malignant neoplasm of unspecified part of unspecified bronchus or lung: Secondary | ICD-10-CM | POA: Diagnosis present

## 2021-11-04 DIAGNOSIS — I10 Essential (primary) hypertension: Secondary | ICD-10-CM | POA: Diagnosis present

## 2021-11-04 DIAGNOSIS — Z8673 Personal history of transient ischemic attack (TIA), and cerebral infarction without residual deficits: Secondary | ICD-10-CM

## 2021-11-04 DIAGNOSIS — Z85118 Personal history of other malignant neoplasm of bronchus and lung: Secondary | ICD-10-CM | POA: Diagnosis not present

## 2021-11-04 DIAGNOSIS — Z86711 Personal history of pulmonary embolism: Secondary | ICD-10-CM | POA: Diagnosis not present

## 2021-11-04 DIAGNOSIS — E876 Hypokalemia: Secondary | ICD-10-CM | POA: Diagnosis not present

## 2021-11-04 DIAGNOSIS — I509 Heart failure, unspecified: Secondary | ICD-10-CM

## 2021-11-04 DIAGNOSIS — N179 Acute kidney failure, unspecified: Secondary | ICD-10-CM | POA: Diagnosis not present

## 2021-11-04 DIAGNOSIS — J9811 Atelectasis: Secondary | ICD-10-CM | POA: Diagnosis not present

## 2021-11-04 DIAGNOSIS — I130 Hypertensive heart and chronic kidney disease with heart failure and stage 1 through stage 4 chronic kidney disease, or unspecified chronic kidney disease: Secondary | ICD-10-CM | POA: Diagnosis present

## 2021-11-04 DIAGNOSIS — I5032 Chronic diastolic (congestive) heart failure: Secondary | ICD-10-CM | POA: Diagnosis present

## 2021-11-04 DIAGNOSIS — Z79899 Other long term (current) drug therapy: Secondary | ICD-10-CM | POA: Insufficient documentation

## 2021-11-04 DIAGNOSIS — I5033 Acute on chronic diastolic (congestive) heart failure: Secondary | ICD-10-CM | POA: Diagnosis not present

## 2021-11-04 DIAGNOSIS — Z885 Allergy status to narcotic agent status: Secondary | ICD-10-CM | POA: Diagnosis not present

## 2021-11-04 DIAGNOSIS — D6869 Other thrombophilia: Secondary | ICD-10-CM

## 2021-11-04 DIAGNOSIS — J90 Pleural effusion, not elsewhere classified: Secondary | ICD-10-CM | POA: Diagnosis not present

## 2021-11-04 DIAGNOSIS — Z902 Acquired absence of lung [part of]: Secondary | ICD-10-CM

## 2021-11-04 DIAGNOSIS — I110 Hypertensive heart disease with heart failure: Secondary | ICD-10-CM | POA: Diagnosis not present

## 2021-11-04 DIAGNOSIS — I340 Nonrheumatic mitral (valve) insufficiency: Secondary | ICD-10-CM

## 2021-11-04 DIAGNOSIS — N1831 Chronic kidney disease, stage 3a: Secondary | ICD-10-CM | POA: Diagnosis present

## 2021-11-04 DIAGNOSIS — R600 Localized edema: Secondary | ICD-10-CM | POA: Diagnosis not present

## 2021-11-04 DIAGNOSIS — I081 Rheumatic disorders of both mitral and tricuspid valves: Secondary | ICD-10-CM | POA: Diagnosis not present

## 2021-11-04 DIAGNOSIS — I361 Nonrheumatic tricuspid (valve) insufficiency: Secondary | ICD-10-CM

## 2021-11-04 DIAGNOSIS — E669 Obesity, unspecified: Secondary | ICD-10-CM | POA: Diagnosis present

## 2021-11-04 DIAGNOSIS — Z86718 Personal history of other venous thrombosis and embolism: Secondary | ICD-10-CM

## 2021-11-04 DIAGNOSIS — Q2112 Patent foramen ovale: Secondary | ICD-10-CM | POA: Diagnosis not present

## 2021-11-04 DIAGNOSIS — J439 Emphysema, unspecified: Secondary | ICD-10-CM | POA: Diagnosis not present

## 2021-11-04 DIAGNOSIS — N289 Disorder of kidney and ureter, unspecified: Secondary | ICD-10-CM | POA: Diagnosis not present

## 2021-11-04 LAB — BASIC METABOLIC PANEL
Anion gap: 10 (ref 5–15)
BUN: 27 mg/dL — ABNORMAL HIGH (ref 8–23)
CO2: 27 mmol/L (ref 22–32)
Calcium: 9.1 mg/dL (ref 8.9–10.3)
Chloride: 103 mmol/L (ref 98–111)
Creatinine, Ser: 1.42 mg/dL — ABNORMAL HIGH (ref 0.44–1.00)
GFR, Estimated: 36 mL/min — ABNORMAL LOW (ref >60)
Glucose, Bld: 134 mg/dL — ABNORMAL HIGH (ref 70–99)
Potassium: 3.8 mmol/L (ref 3.5–5.1)
Sodium: 140 mmol/L (ref 135–145)

## 2021-11-04 LAB — ECHOCARDIOGRAM COMPLETE
Calc EF: 48.5 %
Height: 65 in
S' Lateral: 3.92 cm
Single Plane A2C EF: 46.9 %
Single Plane A4C EF: 51.9 %
Weight: 3366.4 oz

## 2021-11-04 LAB — CBC WITH DIFFERENTIAL/PLATELET
Abs Immature Granulocytes: 0.02 10*3/uL (ref 0.00–0.07)
Basophils Absolute: 0 10*3/uL (ref 0.0–0.1)
Basophils Relative: 0 %
Eosinophils Absolute: 0.1 10*3/uL (ref 0.0–0.5)
Eosinophils Relative: 1 %
HCT: 45.5 % (ref 36.0–46.0)
Hemoglobin: 14.3 g/dL (ref 12.0–15.0)
Immature Granulocytes: 0 %
Lymphocytes Relative: 25 %
Lymphs Abs: 2.1 10*3/uL (ref 0.7–4.0)
MCH: 31.6 pg (ref 26.0–34.0)
MCHC: 31.4 g/dL (ref 30.0–36.0)
MCV: 100.7 fL — ABNORMAL HIGH (ref 80.0–100.0)
Monocytes Absolute: 0.6 10*3/uL (ref 0.1–1.0)
Monocytes Relative: 8 %
Neutro Abs: 5.4 10*3/uL (ref 1.7–7.7)
Neutrophils Relative %: 66 %
Platelets: 218 10*3/uL (ref 150–400)
RBC: 4.52 MIL/uL (ref 3.87–5.11)
RDW: 15 % (ref 11.5–15.5)
WBC: 8.2 10*3/uL (ref 4.0–10.5)
nRBC: 0 % (ref 0.0–0.2)

## 2021-11-04 LAB — BRAIN NATRIURETIC PEPTIDE: B Natriuretic Peptide: 669.9 pg/mL — ABNORMAL HIGH (ref 0.0–100.0)

## 2021-11-04 LAB — PROTIME-INR
INR: 3.3 — ABNORMAL HIGH (ref 0.8–1.2)
Prothrombin Time: 33 seconds — ABNORMAL HIGH (ref 11.4–15.2)

## 2021-11-04 MED ORDER — VITAMIN C 500 MG PO TABS
500.0000 mg | ORAL_TABLET | Freq: Every day | ORAL | Status: DC
  Administered 2021-11-05 – 2021-11-07 (×3): 500 mg via ORAL
  Filled 2021-11-04 (×3): qty 1

## 2021-11-04 MED ORDER — ADULT MULTIVITAMIN W/MINERALS CH
1.0000 | ORAL_TABLET | Freq: Every day | ORAL | Status: DC
  Administered 2021-11-05 – 2021-11-07 (×2): 1 via ORAL
  Filled 2021-11-04 (×3): qty 1

## 2021-11-04 MED ORDER — ONDANSETRON HCL 4 MG/2ML IJ SOLN: 4.0000 mg | Freq: Four times a day (QID) | INTRAMUSCULAR | Status: DC | PRN

## 2021-11-04 MED ORDER — DILTIAZEM LOAD VIA INFUSION
15.0000 mg | Freq: Once | INTRAVENOUS | Status: AC
  Administered 2021-11-05: 15 mg via INTRAVENOUS
  Filled 2021-11-04: qty 15

## 2021-11-04 MED ORDER — LORAZEPAM 0.5 MG PO TABS: 0.5000 mg | ORAL_TABLET | Freq: Every day | ORAL | Status: DC | PRN

## 2021-11-04 MED ORDER — ONDANSETRON HCL 4 MG PO TABS: 4.0000 mg | ORAL_TABLET | Freq: Four times a day (QID) | ORAL | Status: DC | PRN

## 2021-11-04 MED ORDER — WARFARIN - PHARMACIST DOSING INPATIENT: Freq: Every day | Status: DC

## 2021-11-04 MED ORDER — VITAMIN D 25 MCG (1000 UNIT) PO TABS
1000.0000 [IU] | ORAL_TABLET | Freq: Every day | ORAL | Status: DC
  Administered 2021-11-05 – 2021-11-07 (×3): 1000 [IU] via ORAL
  Filled 2021-11-04 (×3): qty 1

## 2021-11-04 MED ORDER — PSYLLIUM 95 % PO PACK
1.0000 | PACK | Freq: Every day | ORAL | Status: DC
  Administered 2021-11-05 – 2021-11-07 (×3): 1 via ORAL
  Filled 2021-11-04 (×4): qty 1

## 2021-11-04 MED ORDER — PANTOPRAZOLE SODIUM 40 MG PO TBEC: 40.0000 mg | DELAYED_RELEASE_TABLET | Freq: Every day | ORAL | Status: DC

## 2021-11-04 MED ORDER — PANTOPRAZOLE SODIUM 40 MG PO TBEC
40.0000 mg | DELAYED_RELEASE_TABLET | Freq: Every day | ORAL | Status: DC
  Administered 2021-11-04 – 2021-11-07 (×4): 40 mg via ORAL
  Filled 2021-11-04 (×4): qty 1

## 2021-11-04 MED ORDER — WARFARIN SODIUM 2 MG PO TABS
2.0000 mg | ORAL_TABLET | Freq: Once | ORAL | Status: AC
  Administered 2021-11-04: 2 mg via ORAL
  Filled 2021-11-04: qty 1

## 2021-11-04 MED ORDER — ACETAMINOPHEN 500 MG PO TABS
1000.0000 mg | ORAL_TABLET | Freq: Two times a day (BID) | ORAL | Status: DC
  Administered 2021-11-04 – 2021-11-07 (×7): 1000 mg via ORAL
  Filled 2021-11-04 (×7): qty 2

## 2021-11-04 MED ORDER — PROPRANOLOL HCL 10 MG PO TABS: 80.0000 mg | ORAL_TABLET | Freq: Every day | ORAL | Status: DC

## 2021-11-04 MED ORDER — CALCIUM CARBONATE 1250 (500 CA) MG PO TABS
1250.0000 mg | ORAL_TABLET | Freq: Every day | ORAL | Status: DC
  Administered 2021-11-05 – 2021-11-07 (×3): 1250 mg via ORAL
  Filled 2021-11-04 (×3): qty 1

## 2021-11-04 MED ORDER — PROPRANOLOL HCL ER 80 MG PO CP24: 80.0000 mg | ORAL_CAPSULE | Freq: Every day | ORAL | Status: DC

## 2021-11-04 MED ORDER — DILTIAZEM HCL-DEXTROSE 125-5 MG/125ML-% IV SOLN (PREMIX)
5.0000 mg/h | INTRAVENOUS | Status: DC
  Administered 2021-11-05: 5 mg/h via INTRAVENOUS
  Administered 2021-11-05 (×3): 15 mg/h via INTRAVENOUS
  Administered 2021-11-05: 10 mg/h via INTRAVENOUS
  Filled 2021-11-04 (×6): qty 125

## 2021-11-04 MED ORDER — FERROUS SULFATE 325 (65 FE) MG PO TABS
325.0000 mg | ORAL_TABLET | Freq: Every day | ORAL | Status: DC
  Administered 2021-11-05 – 2021-11-07 (×3): 325 mg via ORAL
  Filled 2021-11-04 (×3): qty 1

## 2021-11-04 MED ORDER — FUROSEMIDE 10 MG/ML IJ SOLN
40.0000 mg | Freq: Two times a day (BID) | INTRAMUSCULAR | Status: DC
  Administered 2021-11-04 – 2021-11-05 (×2): 40 mg via INTRAVENOUS
  Filled 2021-11-04 (×2): qty 4

## 2021-11-04 MED ORDER — DILTIAZEM HCL ER COATED BEADS 120 MG PO CP24
120.0000 mg | ORAL_CAPSULE | Freq: Every day | ORAL | Status: DC
  Administered 2021-11-04: 120 mg via ORAL
  Filled 2021-11-04: qty 1

## 2021-11-04 MED ORDER — FUROSEMIDE 10 MG/ML IJ SOLN
40.0000 mg | Freq: Once | INTRAMUSCULAR | Status: AC
  Administered 2021-11-04: 40 mg via INTRAVENOUS
  Filled 2021-11-04: qty 4

## 2021-11-04 NOTE — Assessment & Plan Note (Signed)
Continue diltiazem and propranolol

## 2021-11-04 NOTE — ED Notes (Signed)
Patient is in echo when this RN arrives to shift at 1500

## 2021-11-04 NOTE — Assessment & Plan Note (Signed)
Continue diltiazem and propranolol for rate control Hold Coumadin since INR is greater than 3 Pharmacy consult for Coumadin management

## 2021-11-04 NOTE — Progress Notes (Addendum)
ANTICOAGULATION CONSULT NOTE - Initial Consult  Pharmacy Consult for warfarin Indication: atrial fibrillation  Allergies  Allergen Reactions   Codeine Nausea And Vomiting   Vicodin [Hydrocodone-Acetaminophen] Other (See Comments)    Doesn't like how it makes her feel    Vital Signs: Temp: 97.7 F (36.5 C) (09/12 1215) Temp Source: Oral (09/12 1215) BP: 138/77 (09/12 1415) Pulse Rate: 88 (09/12 1415)  Labs: Recent Labs    11/04/21 1244  HGB 14.3  HCT 45.5  PLT 218  LABPROT 33.0*  INR 3.3*  CREATININE 1.42*    Estimated Creatinine Clearance: 31.3 mL/min (A) (by C-G formula based on SCr of 1.42 mg/dL (H)).   Medical History: Past Medical History:  Diagnosis Date   Hypertension    Non-small cell lung cancer (NSCLC) ([facility]) 2006   s/p surgery, no chemo/XRT needed    Medications:  (Not in a hospital admission)  Scheduled:   acetaminophen  1,000 mg Oral BID   ascorbic acid  500 mg Oral Daily   calcium carbonate  600 mg Oral q morning   cholecalciferol  1,000 Units Oral Daily   diltiazem  120 mg Oral Daily   ferrous sulfate  1 tablet Oral q morning   furosemide  40 mg Intravenous BID   multivitamin with minerals  1 tablet Oral Daily   pantoprazole  40 mg Oral Daily   propranolol  80 mg Oral Daily   psyllium  1 packet Oral Daily    Assessment: 77 yoF with PMH: non small cell lung cancer, HTN, recently diagnosed afib, bilateral PE in 2011 and 2013 who presented to ED with shortness of breath and still in afib, and gained ten pounds since last visit. Pharmacy consulted to dose warfarin. Patient was on warfarin 3mg  daily. 9/12: INR 3.3, CBC stable. Patient has not had dose today.  Goal of Therapy:  INR 2-3 Monitor platelets by anticoagulation protocol: Yes   Plan:  Initiate warfarin 2 mg for 1 dose tonight (9/12) F/u daily INR, signs/symptoms of bleeding, CBC  Sandford Craze, PharmD. [facility] Acute Care PGY-1 11/04/2021 5:21 PM

## 2021-11-04 NOTE — Assessment & Plan Note (Signed)
BMI 35 Complicates overall prognosis and care

## 2021-11-04 NOTE — Progress Notes (Addendum)
Primary Care Physician: Lajean Manes, MD Referring Physician:Dr. Levi Crass is a 88 y.o. female with a h/o non small cell lung cancer with partial left lung resection, HTN that is in the afib clinic for f/u new onset seen by Dr. Felipa Eth last week.  He exchanged amlodipine for Cardizem and stopped hctz for lasix. She was previously on warfarin for "blood clots."  CHA2DS2VASc score is at least 6.   Since last week with med changes, she has not noted any improvement. She feels very short of breath and family is estimating a 10 weight gain with some weeping of the lower extremitates at times. She was hooked up to O2 as she was stating hard to breath. Her blood pressure was not able to be auscultated today  by 2 people.  EKG shows afib with v rates around 100 bpm. BP last week 118/74.  Being unable to determine BP, and with advanced age and being symptomatic, I do not feel comfortable trying to  adjust her meds as as outpatient so discussed sending pt to the ER for further evaluation and treatment. They are in agreement. They said she was full functional at her age just a couple of weeks ago and now having difficulty walking across floor.    Today, she denies symptoms of palpitations, chest pain, positive for shortness of breath,  lower extremity edema, negative for dizziness, presyncope, syncope, or neurologic sequela.   Past Medical History:  Diagnosis Date   Hypertension    lung ca dx'd 2006   Past Surgical History:  Procedure Laterality Date   HIP SURGERY     KNEE SURGERY     LUNG REMOVAL, PARTIAL     Left lower lobe removed     Current Outpatient Medications  Medication Sig Dispense Refill   acetaminophen (TYLENOL) 500 MG tablet Take 1,000 mg by mouth 2 (two) times daily.     calcium carbonate (OS-CAL) 600 MG TABS Take 600 mg by mouth every morning. With Vitamin D-     cholecalciferol (VITAMIN D) 1000 UNITS tablet Take 1,000 Units by mouth daily.     COUMADIN 3 MG  tablet 1 tablet (3 mg total) daily at 4pm.     diltiazem (CARDIZEM CD) 120 MG 24 hr capsule Take 120 mg by mouth daily.     ferrous sulfate 324 (65 Fe) MG TBEC Take 1 tablet by mouth every morning.     furosemide (LASIX) 20 MG tablet Take 20 mg by mouth 2 (two) times daily.     LORazepam (ATIVAN) 0.5 MG tablet Take 0.5 mg by mouth daily as needed.     Multiple Vitamin (MULTIVITAMIN WITH MINERALS) TABS Take 1 tablet by mouth daily.     propranolol (INDERAL) 80 MG tablet Take 80 mg by mouth daily. For high BP     psyllium (HYDROCIL/METAMUCIL) 95 % PACK Take 1 packet by mouth daily.     RABEprazole (ACIPHEX) 20 MG tablet Take 20 mg by mouth daily.     tretinoin (RETIN-A) 0.05 % cream Apply topically as needed.     vitamin C (ASCORBIC ACID) 500 MG tablet Take 500 mg by mouth daily.     No current facility-administered medications for this encounter.    Allergies  Allergen Reactions   Codeine Nausea And Vomiting   Vicodin [Hydrocodone-Acetaminophen] Other (See Comments)    Doesn't like how it makes her feel    Social History   Socioeconomic History   Marital status: Widowed  Spouse name: Not on file   Number of children: Not on file   Years of education: Not on file   Highest education level: Not on file  Occupational History   Not on file  Tobacco Use   Smoking status: Never   Smokeless tobacco: Never  Substance and Sexual Activity   Alcohol use: No   Drug use: Not on file   Sexual activity: Not on file  Other Topics Concern   Not on file  Social History Narrative   Not on file   Social Determinants of Health   Financial Resource Strain: Not on file  Food Insecurity: Not on file  Transportation Needs: Not on file  Physical Activity: Not on file  Stress: Not on file  Social Connections: Not on file  Intimate Partner Violence: Not on file    No family history on file.  ROS- All systems are reviewed and negative except as per the HPI above  Physical  Exam: Vitals:   11/04/21 1058  Weight: 95.4 kg  Height: 5\' 5"  (1.651 m)   Wt Readings from Last 3 Encounters:  11/04/21 95.4 kg  09/11/14 86 kg  09/05/13 94.7 kg    Labs: Lab Results  Component Value Date   NA 140 09/04/2014   K 4.3 09/04/2014   CL 99 05/03/2012   CO2 30 (H) 09/04/2014   GLUCOSE 94 09/04/2014   BUN 28.3 (H) 09/04/2014   CREATININE 1.1 09/04/2014   CALCIUM 9.6 09/04/2014   Lab Results  Component Value Date   INR 1.1 11/17/2016   No results found for: "CHOL", "HDL", "LDLCALC", "TRIG"   GEN- The patient is well appearing, alert and oriented x 3 today.   Head- normocephalic, atraumatic Eyes-  Sclera clear, conjunctiva pink Ears- hearing intact Oropharynx- clear Neck- supple, no JVP Lymph- no cervical lymphadenopathy Lungs- Clear to ausculation bilaterally, normal work of breathing Heart- irregular rate and rhythm, no murmurs, rubs or gallops, PMI not laterally displaced GI- soft, NT, ND, + BS Extremities- no clubbing, cyanosis, or ++ edema MS- no significant deformity or atrophy Skin- no rash or lesion Psych- euthymic mood, full affect Neuro- strength and sensation are intact  EKG-Vent. rate 109 BPM PR interval * ms QRS duration 82 ms QT/QTcB 338/455 ms P-R-T axes * -6 -41 Atrial fibrillation with rapid ventricular response Low voltage QRS Cannot rule out Anterior infarct , age undetermined Abnormal ECG When compared with ECG of 27-Apr-2010 11:31, PREVIOUS ECG IS PRESENT    Assessment and Plan:  1. New onset afib Pt is symptomatic with shortness of breath and per family 10 lb weight gain that has not shown improvement since starting lasix late last week  She is currently on dilt 120 mg daily plus propanolol 80 mg daily  and still with v rates in the low 100's. Taking lasix 20 mg bid x days with no loss of fluid weight since starting. BP cannot be auscultated in the office so I do not feel comfortable with advanced age and symptoms to  adjust meds and send her home. I discussed with famliy and she is agreement to go to ED for further treatment.  Continue  warfarin for a CHA2DS2VASc  score of at least 6.   To ED  Geroge Baseman. Galen Malkowski, [facility] [address] [place], [place] [address] [phone]

## 2021-11-04 NOTE — Assessment & Plan Note (Addendum)
Most likely related to rapid A-fib We will place patient on IV Lasix Optimize blood pressure control Obtain 2D echocardiogram to assess LVEF Consult cardiology

## 2021-11-04 NOTE — Addendum Note (Signed)
Encounter addended by: Sherran Needs, NP on: 11/04/2021 12:51 PM  Actions taken: Clinical Note Signed

## 2021-11-04 NOTE — Consult Note (Signed)
Cardiology Consultation   Patient ID: Alison Dunn MRN: [id number]; DOB: 03/30/1931  Admit date: 11/04/2021 Date of Consult: 11/04/2021  PCP:  Lajean Manes, [facility] Cardiologist:  None  Cardiology APP:  Sherran Needs, NP       Patient Profile:   Alison Dunn is a 88 y.o. female with a hx of HTN, [place] lung CA s/p lobectomy w/ no further rx needed, bilat PE 2011 & 2013 on coumadin, who is being seen 11/04/2021 for the evaluation of CHF and atrial fib at the request of Dr Francine Graven.  History of Present Illness:   Alison Dunn was seen in the [facility] today as a new patient.   Dr Felipa Eth had been managing volume overload and atrial fib. He had stopped HCTZ >> Lasix, amlodipine >> Cardizem. Alison Dunn had not improved and was referred to the [facility].  In the clinic, they were unable to get a BP, she required O2 to maintain sats, she had LE edema and 10 lb weight gain. She was sent to the ER.   Alison Dunn started noticing increasing dyspnea on exertion about 2 weeks ago.  She denies orthopnea or PND.  She has chronic left lower extremity edema, after she had clots in that leg twice.  She does not weigh herself at home, but noticed that between doctor visits, she gained 10 pounds.  She got so short of breath that she could not walk room to room without feeling extremely winded.  It finally got so bad that she had to see Dr. Felipa Eth.  She stopped Dr. Felipa Eth a week ago when he diagnosed the atrial fibrillation, discontinuing the amlodipine and starting her on Cardizem.  She was already on warfarin.  She had been on HCTZ but he changed that to Lasix because she was volume overloaded.  He arranged a new patient visit with the A fib clinic.  When she went today, she was hypoxic, very short of breath and sent to the ER.  In the ER, she is breathing a little bit better on oxygen.  She is completely unaware of the atrial fibrillation, has never had  palpitations, denies presyncope or syncope.   Past Medical History:  Diagnosis Date   Hypertension    Non-small cell lung cancer (NSCLC) ([place]) 2006   s/p surgery, no chemo/XRT needed    Past Surgical History:  Procedure Laterality Date   HIP SURGERY     KNEE SURGERY     LUNG REMOVAL, PARTIAL     Left lower lobe removed      Home Medications:  Prior to Admission medications   Medication Sig Start Date End Date Taking? Authorizing Provider  acetaminophen (TYLENOL) 500 MG tablet Take 1,000 mg by mouth 2 (two) times daily.    [provider]  calcium carbonate (OS-CAL) 600 MG TABS Take 600 mg by mouth every morning. With Vitamin D-    [provider]  cholecalciferol (VITAMIN D) 1000 UNITS tablet Take 1,000 Units by mouth daily.    [provider]  COUMADIN 3 MG tablet 1 tablet (3 mg total) daily at 4pm. 08/24/14   [provider]  diltiazem (CARDIZEM CD) 120 MG 24 hr capsule Take 120 mg by mouth daily. 10/24/21   [provider]  ferrous sulfate 324 (65 Fe) MG TBEC Take 1 tablet by mouth every morning.    [provider]  furosemide (LASIX) 20 MG tablet Take 20 mg by  mouth 2 (two) times daily. 10/24/21   [provider]  LORazepam (ATIVAN) 0.5 MG tablet Take 0.5 mg by mouth daily as needed. 06/18/14   [provider]  Multiple Vitamin (MULTIVITAMIN WITH MINERALS) TABS Take 1 tablet by mouth daily.    [provider]  propranolol (INDERAL) 80 MG tablet Take 80 mg by mouth daily. For high BP    [provider]  psyllium (HYDROCIL/METAMUCIL) 95 % PACK Take 1 packet by mouth daily.    [provider]  RABEprazole (ACIPHEX) 20 MG tablet Take 20 mg by mouth daily.    [provider]  tretinoin (RETIN-A) 0.05 % cream Apply topically as needed.    [provider]  vitamin C (ASCORBIC ACID) 500 MG tablet Take 500 mg by mouth daily.    [provider]    Inpatient  Medications: Scheduled Meds:  acetaminophen  1,000 mg Oral BID   ascorbic acid  500 mg Oral Daily   calcium carbonate  600 mg Oral q morning   cholecalciferol  1,000 Units Oral Daily   diltiazem  120 mg Oral Daily   ferrous sulfate  1 tablet Oral q morning   furosemide  40 mg Intravenous BID   multivitamin with minerals  1 tablet Oral Daily   pantoprazole  40 mg Oral Daily   propranolol  80 mg Oral Daily   psyllium  1 packet Oral Daily   Continuous Infusions:  PRN Meds: LORazepam, ondansetron **OR** ondansetron (ZOFRAN) IV  Allergies:    Allergies  Allergen Reactions   Codeine Nausea And Vomiting   Vicodin [Hydrocodone-Acetaminophen] Other (See Comments)    Doesn't like how it makes her feel    Social History:   Social History   Socioeconomic History   Marital status: Widowed    Spouse name: Not on file   Number of children: Not on file   Years of education: Not on file   Highest education level: Not on file  Occupational History   Not on file  Tobacco Use   Smoking status: Never   Smokeless tobacco: Never  Substance and Sexual Activity   Alcohol use: No   Drug use: Not on file   Sexual activity: Not on file  Other Topics Concern   Not on file  Social History Narrative   Not on file   Social Determinants of Health   Financial Resource Strain: Not on file  Food Insecurity: Not on file  Transportation Needs: Not on file  Physical Activity: Not on file  Stress: Not on file  Social Connections: Not on file  Intimate Partner Violence: Not on file    Family History:   Family History  Problem Relation Age of Onset   Cataracts Mother    Cataracts Father    Cataracts Sister    Cataracts Brother    Glaucoma Brother      ROS:  Please see the history of present illness.  All other ROS reviewed and negative.     Physical Exam/Data:   Vitals:   11/04/21 1215 11/04/21 1415  BP: (!) 147/106 138/77  Pulse: 63 88  Resp: 18 (!) 38  Temp: 97.7 F (36.5 C)    TempSrc: Oral   SpO2: 97% 98%   No intake or output data in the 24 hours ending 11/04/21 1529    11/04/2021   10:58 AM 09/11/2014    9:14 AM 09/05/2013    9:17 AM  Last 3 Weights  Weight (lbs) 210  lb 6.4 oz 189 lb 8 oz 208 lb 12.8 oz  Weight (kg) 95.437 kg 85.957 kg 94.711 kg     There is no height or weight on file to calculate BMI.  General:  Well nourished, well developed, in no acute distress at rest on O2 HEENT: normal Neck:  JVD 10 cm Vascular: No carotid bruits; Distal pulses 2+ bilaterally Cardiac:  normal S1, S2; Irreg R&R; no murmur  Lungs: Rales bases bilaterally, no wheezing, rhonchi or rales  Abd: soft, nontender, no hepatomegaly  Ext: no edema Musculoskeletal:  No deformities, BUE and BLE strength normal and equal Skin: warm and dry  Neuro:  CNs 2-12 intact, no focal abnormalities noted Psych:  Normal affect   EKG:  The EKG was personally reviewed and demonstrates:  atrial fib, RVR, HR 109 Telemetry:  Telemetry was personally reviewed and demonstrates: Atrial fibrillation, rate elevated  Relevant CV Studies:  ECHO: pending   Laboratory Data:  High Sensitivity Troponin:  No results for input(s): "TROPONINIHS" in the last 720 hours.   Chemistry Recent Labs  Lab 11/04/21 1244  NA 140  K 3.8  CL 103  CO2 27  GLUCOSE 134*  BUN 27*  CREATININE 1.42*  CALCIUM 9.1  GFRNONAA 36*  ANIONGAP 10    No results for input(s): "PROT", "ALBUMIN", "AST", "ALT", "ALKPHOS", "BILITOT" in the last 168 hours. Lipids No results for input(s): "CHOL", "TRIG", "HDL", "LABVLDL", "LDLCALC", "CHOLHDL" in the last 168 hours.  Hematology Recent Labs  Lab 11/04/21 1244  WBC 8.2  RBC 4.52  HGB 14.3  HCT 45.5  MCV 100.7*  MCH 31.6  MCHC 31.4  RDW 15.0  PLT 218   Thyroid No results for input(s): "TSH", "FREET4" in the last 168 hours.  BNP Recent Labs  Lab 11/04/21 1244  BNP 669.9*    Lab Results  Component Value Date   INR 3.3 (H) 11/04/2021   INR 1.1  11/17/2016   INR 1.0 09/01/2016     Radiology/Studies:  DG Chest 1 View  Result Date: 11/04/2021 CLINICAL DATA:  Shortness of breath weight gain and lower extremity edema. EXAM: CHEST  1 VIEW COMPARISON:  Chest CT 09/04/2014 FINDINGS: The cardiac silhouette, mediastinal and hilar contours are within normal limits for age and given the AP projection. Underlying emphysematous changes. No vascular congestion or pulmonary edema. There are small bilateral pleural effusions and bibasilar atelectasis. IMPRESSION: Small bilateral pleural effusions and bibasilar atelectasis. Electronically Signed   By: Marijo Sanes M.D.   On: 11/04/2021 13:10     Assessment and Plan:   Atrial Fib - unknown duration, but definitely > 48 hr - This patients CHA2DS2-VASc Score and unadjusted Ischemic Stroke Rate (% per year) is equal to 11.2 % stroke rate/year from a score of 7 Above score calculated as 1 point each if present [CHF, HTN, DM, Vascular=MI/PAD/Aortic Plaque, Age if 65-74, or Female] Above score calculated as 2 points each if present [Age > 75, or Stroke/TIA/TE]  - check TSH - f/u on echo results - for now, rate control - she is currently on her home dose of Cardizem CD 120 mg qd - will add prn IV Lopressor to help w/ HR control - further management once echo reviewed  2. Acute CHF - BNP is elevated, but only minimal fluid on chest x-ray. -She got 40 mg of Lasix x1 today and is on this twice daily. -Continue this for now and follow daily weights, intake/output and renal function -Her kidney function is abnormal,  but we have no labs since 2016.  We will contact Dr. Carlyle Lipa office to get some records faxed over  3. LLE Cellulitis - her L leg is always swollen, but it is worse now -She has discoloration, erythema and blisters as well -Antibiotics and management per IM, but she may benefit from an Unna boot or wound consult   Risk Assessment/Risk Scores:       :[id number]}     New York Heart  Association (NYHA) Functional Class NYHA Class IV  CHA2DS2-VASc Score = 7   This indicates a 11.2% annual risk of stroke. The patient's score is based upon: CHF History: 1 HTN History: 1 Diabetes History: 0 Stroke History: 2 Vascular Disease History: 0 Age Score: 2 Gender Score: 1    For questions or updates, please contact [facility] Please consult [email or web address] for contact info under    Signed, Rosaria Ferries, PA-C  11/04/2021 3:29 PM

## 2021-11-04 NOTE — ED Notes (Signed)
Pt transported to ECHO. 

## 2021-11-04 NOTE — ED Notes (Signed)
Per provider that is awaiting to do the patient's exam, she would like to see the patient upstairs and have the patient put in for transport to go from ECHO and then go directly upstairs without returning to the ED

## 2021-11-04 NOTE — ED Triage Notes (Signed)
Patient here with complaint of lower extremity edema, shortness of breath, and a ten pound weight gain over the last few months. Room air SpO2 98%. Patient is alert, oriented, states shortness of breath is exacerbated by minimal exertion.

## 2021-11-04 NOTE — Addendum Note (Signed)
Encounter addended by: Sherran Needs, NP on: 11/04/2021 12:47 PM  Actions taken: Clinical Note Signed

## 2021-11-04 NOTE — Assessment & Plan Note (Signed)
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

## 2021-11-04 NOTE — ED Provider Triage Note (Signed)
Emergency Medicine Provider Triage Evaluation Note  Alison Dunn , a 88 y.o. female  was evaluated in triage.  Pt complains of SHOB, lower leg swelling, 10 lb weight gain over 2 weeks, lasix increased (doubled) to 40mg  daily without improvement over the past 3 days. Went to a fib clinic today and was sent to the ER when they couldn't measure her BP.  On Coumadin.  Review of Systems  Positive: SHOB, leg swelling Negative: CP, fever  Physical Exam  BP (!) 147/106 (BP Location: Left Arm)   Pulse 63   Temp 97.7 F (36.5 C) (Oral)   Resp 18   SpO2 97%  Gen:   Awake, no distress   Resp:  Normal effort  MSK:   Moves extremities without difficulty  Other:  Lower extremity edema  Medical Decision Making  Medically screening exam initiated at 12:41 PM.  Appropriate orders placed.  Alison Dunn was informed that the remainder of the evaluation will be completed by another provider, this initial triage assessment does not replace that evaluation, and the importance of remaining in the ED until their evaluation is complete.     Tacy Learn, PA-C 11/04/21 1242

## 2021-11-04 NOTE — ED Provider Notes (Signed)
[facility] EMERGENCY DEPARTMENT Provider Note   CSN: [id number] Arrival date & time: 11/04/21  1211     History Chief Complaint  Patient presents with   Leg Swelling    HPI Alison Dunn is a 88 y.o. female presenting for fatigue shortness of breath 10 pound weight gain.  She has a history of hypertension, lung cancer status postpneumonectomy, new diagnosis atrial fibrillation recently switched to Cardizem.  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 Family states that she has become progressively dyspneic fatigued over the past week with 10 pound weight gain and worsening bilateral lower extremity swelling..  Patient's recorded medical, surgical, social, medication list and allergies were reviewed in the Snapshot window as part of the initial history.   Review of Systems   Review of Systems  Constitutional:  Positive for fatigue. Negative for chills and fever.  HENT:  Negative for ear pain and sore throat.   Eyes:  Negative for pain and visual disturbance.  Respiratory:  Positive for cough and shortness of breath.   Cardiovascular:  Negative for chest pain and palpitations.  Gastrointestinal:  Negative for abdominal pain and vomiting.  Genitourinary:  Negative for dysuria and hematuria.  Musculoskeletal:  Negative for arthralgias and back pain.  Skin:  Negative for color change and rash.  Neurological:  Negative for seizures and syncope.  All other systems reviewed and are negative.   Physical Exam Updated Vital Signs BP 138/77   Pulse 88   Temp 97.7 F (36.5 C) (Oral)   Resp (!) 38   SpO2 98%  Physical Exam Vitals and nursing note reviewed.  Constitutional:      General: She is not in acute distress.    Appearance: She is well-developed.  HENT:     Head: Normocephalic and atraumatic.  Eyes:     Conjunctiva/sclera: Conjunctivae normal.  Cardiovascular:     Rate and Rhythm: Normal rate and regular rhythm.     Heart sounds: No murmur  heard. Pulmonary:     Effort: Pulmonary effort is normal. No respiratory distress.     Breath sounds: Rales present.  Abdominal:     General: There is no distension.     Palpations: Abdomen is soft.     Tenderness: There is no abdominal tenderness. There is no right CVA tenderness or left CVA tenderness.  Musculoskeletal:        General: No swelling or tenderness. Normal range of motion.     Cervical back: Neck supple.     Right lower leg: Edema present.     Left lower leg: Edema present.  Skin:    General: Skin is warm and dry.  Neurological:     General: No focal deficit present.     Mental Status: She is alert and oriented to person, place, and time. Mental status is at baseline.     Cranial Nerves: No cranial nerve deficit.      ED Course/ Medical Decision Making/ A&P Clinical Course as of 11/04/21 1621  Tue Nov 04, 2021  1339 Cash Admit  [CC]    Clinical Course User Index [CC] Tretha Sciara, MD    Procedures Procedures   Medications Ordered in ED Medications  acetaminophen (TYLENOL) tablet 1,000 mg (has no administration in time range)  diltiazem (CARDIZEM CD) 24 hr capsule 120 mg (has no administration in time range)  propranolol (INDERAL) tablet 80 mg (has no administration in time range)  LORazepam (ATIVAN) tablet 0.5 mg (  has no administration in time range)  psyllium (HYDROCIL/METAMUCIL) 1 packet (has no administration in time range)  pantoprazole (PROTONIX) EC tablet 40 mg (has no administration in time range)  ferrous sulfate TBEC 325 mg (has no administration in time range)  calcium carbonate (OS-CAL) tablet 600 mg (has no administration in time range)  cholecalciferol (VITAMIN D3) 25 MCG (1000 UNIT) tablet 1,000 Units (has no administration in time range)  multivitamin with minerals tablet 1 tablet (has no administration in time range)  ascorbic acid (VITAMIN C) tablet 500 mg (has no administration in time range)  furosemide (LASIX)  injection 40 mg (has no administration in time range)  ondansetron (ZOFRAN) tablet 4 mg (has no administration in time range)    Or  ondansetron (ZOFRAN) injection 4 mg (has no administration in time range)  furosemide (LASIX) injection 40 mg (40 mg Intravenous Given 11/04/21 1422)    Medical Decision Making:    Alison Dunn is a 88 y.o. female who presented to the ED today with shortness of breath and weight gain detailed above.     Patient's presentation is complicated by their history of advanced age, multiple comorbid medical problems including hypertension, DVTs on chronic anticoagulation.  Patient placed on continuous vitals and telemetry monitoring while in ED which was reviewed periodically.   Complete initial physical exam performed, notably the patient  was hemodynamically stable in no acute distress.  She is tachypneic, unable to get through entire sentences.      Reviewed and confirmed nursing documentation for past medical history, family history, social history.    Initial Assessment:   With the patient's presentation of shortness of breath and weight gain, most likely diagnosis is heart failure exacerbation likely complicated by her ongoing A-fib. Other diagnoses were considered including (but not limited to) pneumonia, pneumothorax, ACS, pulmonary embolism. These are considered less likely due to history of present illness and physical exam findings.   This is most consistent with an acute life/limb threatening illness complicated by underlying chronic conditions.  Initial Plan:   Screening labs including CBC and Metabolic panel to evaluate for infectious or metabolic etiology of disease.  CXR to evaluate for structural/infectious intrathoracic pathology.  BNP, EKG to evaluate for cardiac pathology. Objective evaluation as below reviewed with plan for close reassessment  Initial Study Results:   Laboratory  BNP elevation  EKG EKG was reviewed independently. Rate,  rhythm, axis, intervals all examined and without medically relevant abnormality. ST segments without concerns for elevations.    Radiology  All images reviewed independently. Agree with radiology report at this time.   DG Chest 1 View  Result Date: 11/04/2021 CLINICAL DATA:  Shortness of breath weight gain and lower extremity edema. EXAM: CHEST  1 VIEW COMPARISON:  Chest CT 09/04/2014 FINDINGS: The cardiac silhouette, mediastinal and hilar contours are within normal limits for age and given the AP projection. Underlying emphysematous changes. No vascular congestion or pulmonary edema. There are small bilateral pleural effusions and bibasilar atelectasis. IMPRESSION: Small bilateral pleural effusions and bibasilar atelectasis. Electronically Signed   By: Marijo Sanes M.D.   On: 11/04/2021 13:10     Final Assessment and Plan:   Patient's history of present on his physical and findings are most consistent with acute heart failure exacerbation.  Ongoing comorbid A-fib with RVR at the same time.  We will treat her heart failure exacerbation with IV Lasix and plan for admission for ongoing care and management.  Patient admitted with no further acute events.  Clinical Impression:  1. Leg edema   2. Acute on chronic congestive heart failure, unspecified heart failure type ([place])      Admit   Final Clinical Impression(s) / ED Diagnoses Final diagnoses:  Leg edema  Acute on chronic congestive heart failure, unspecified heart failure type [facility])    Rx / DC Orders ED Discharge Orders     None         Tretha Sciara, MD 11/04/21 1621

## 2021-11-04 NOTE — H&P (Signed)
History and Physical    Patient: Alison Dunn:[id number] DOB: 1931-07-09 DOA: 11/04/2021 DOS: the patient was seen and examined on 11/04/2021 PCP: Alison Manes, MD  Patient coming from: Home  Chief Complaint:  Chief Complaint  Patient presents with   Leg Swelling   HPI: Alison Dunn is a 88 y.o. female with medical history significant for new onset atrial fibrillation, history of non-small cell lung cancer status post left lung resection, hypertension who was referred to the ER from the A-fib clinic for evaluation of shortness of breath initially with exertion but now at rest, increased lower extremity swelling with weeping of her lower extremities and over a 10 pound weight gain despite compliance with diuretic therapy. Patient was recently diagnosed with A-fib and her amlodipine was switched to Cardizem for rate control.  She has a CHA2DS2-VASc score of 6 and was already on anticoagulation with warfarin for history of DVT so that was continued. She denies having any chest pain, no palpitations, no nausea, no vomiting, no dizziness, no lightheadedness, no abdominal pain, no cough, no fever, no chills. Chest x-ray done in the ER showed bilateral pleural effusions and patient's BNP is elevated. She received a dose of Lasix 40 mg IV and will be admitted to the hospital for further evaluation.     Review of Systems: As mentioned in the history of present illness. All other systems reviewed and are negative. Past Medical History:  Diagnosis Date   Hypertension    Non-small cell lung cancer (NSCLC) ([place]) 2006   s/p surgery, no chemo/XRT needed   Past Surgical History:  Procedure Laterality Date   HIP SURGERY     KNEE SURGERY     LUNG REMOVAL, PARTIAL     Left lower lobe removed    Social History:  reports that she has never smoked. She has never used smokeless tobacco. She reports that she does not drink alcohol. No history on file for drug use.  Allergies  Allergen Reactions    Codeine Nausea And Vomiting   Vicodin [Hydrocodone-Acetaminophen] Other (See Comments)    Doesn't like how it makes her feel    Family History  Problem Relation Age of Onset   Cataracts Mother    Cataracts Father    Cataracts Sister    Cataracts Brother    Glaucoma Brother     Prior to Admission medications   Medication Sig Start Date End Date Taking? Authorizing Provider  acetaminophen (TYLENOL) 500 MG tablet Take 1,000 mg by mouth 2 (two) times daily.    [provider]  calcium carbonate (OS-CAL) 600 MG TABS Take 600 mg by mouth every morning. With Vitamin D-    [provider]  cholecalciferol (VITAMIN D) 1000 UNITS tablet Take 1,000 Units by mouth daily.    [provider]  COUMADIN 3 MG tablet 1 tablet (3 mg total) daily at 4pm. 08/24/14   [provider]  diltiazem (CARDIZEM CD) 120 MG 24 hr capsule Take 120 mg by mouth daily. 10/24/21   [provider]  ferrous sulfate 324 (65 Fe) MG TBEC Take 1 tablet by mouth every morning.    [provider]  furosemide (LASIX) 20 MG tablet Take 20 mg by mouth 2 (two) times daily. 10/24/21   [provider]  LORazepam (ATIVAN) 0.5 MG tablet Take 0.5 mg by mouth daily as needed. 06/18/14   [provider]  Multiple Vitamin (MULTIVITAMIN WITH MINERALS) TABS Take 1 tablet by mouth daily.  [provider]  propranolol (INDERAL) 80 MG tablet Take 80 mg by mouth daily. For high BP    [provider]  psyllium (HYDROCIL/METAMUCIL) 95 % PACK Take 1 packet by mouth daily.    [provider]  RABEprazole (ACIPHEX) 20 MG tablet Take 20 mg by mouth daily.    [provider]  tretinoin (RETIN-A) 0.05 % cream Apply topically as needed.    [provider]  vitamin C (ASCORBIC ACID) 500 MG tablet Take 500 mg by mouth daily.    [provider]    Physical Exam: Vitals:   11/04/21 1215 11/04/21 1415  BP: (!) 147/106 138/77  Pulse: 63  88  Resp: 18 (!) 38  Temp: 97.7 F (36.5 C)   TempSrc: Oral   SpO2: 97% 98%   Physical Exam Vitals and nursing note reviewed.  Constitutional:      Appearance: She is obese.  HENT:     Head: Normocephalic and atraumatic.     Nose: Nose normal.     Mouth/Throat:     Mouth: Mucous membranes are moist.  Eyes:     Conjunctiva/sclera: Conjunctivae normal.  Cardiovascular:     Rate and Rhythm: Tachycardia present. Rhythm irregular.  Pulmonary:     Breath sounds: Rales present.  Abdominal:     General: Abdomen is flat. Bowel sounds are normal.     Palpations: Abdomen is soft.  Musculoskeletal:     Cervical back: Normal range of motion and neck supple.     Right lower leg: Edema present.     Left lower leg: Edema present.  Skin:    General: Skin is warm and dry.  Neurological:     General: No focal deficit present.     Mental Status: She is alert.  Psychiatric:        Mood and Affect: Mood normal.        Behavior: Behavior normal.     Data Reviewed: Relevant notes from primary care and specialist visits, past discharge summaries as available in EHR, including Care Everywhere. Prior diagnostic testing as pertinent to current admission diagnoses Updated medications and problem lists for reconciliation ED course, including vitals, labs, imaging, treatment and response to treatment Triage notes, nursing and pharmacy notes and ED provider's notes Notable results as noted in HPI Labs reviewed.  BNP 669, sodium 140, potassium 3.8, chloride 103, bicarb 27, glucose 134, BUN 27, creatinine 1.42, calcium 9.1, white count 8.2, hemoglobin 14.3, hematocrit 45.5, platelet count 218, PT 33, INR 3.3 Chest x-ray reviewed by me shows Small bilateral pleural effusions and bibasilar atelectasis. Twelve-lead EKG reviewed by me shows A-fib with a rapid ventricular rate.  Low voltage QRS There are no new results to review at this time.  Assessment and Plan: * Acute on chronic diastolic CHF  (congestive heart failure) ([place]) Most likely related to rapid A-fib We will place patient on IV Lasix Optimize blood pressure control Obtain 2D echocardiogram to assess LVEF Consult cardiology   Hypertension Continue diltiazem and propranolol  Obesity (BMI 30-39.9) BMI 35 Complicates overall prognosis and care  Atrial fibrillation (HCC) Continue diltiazem and propranolol for rate control Hold Coumadin since INR is greater than 3 Pharmacy consult for Coumadin management  Non-small cell lung cancer ([place]) History of non-small cell lung cancer consistent with adenocarcinoma with bronchioloalveolar features Status post left upper lobectomy Stable       Advance Care Planning:   Code Status: Full Code   Consults: Cardiology  Family Communication:  Greater than 50% of time was spent discussing patient's condition and plan of care with her at the bedside.  All questions and concerns have been addressed.  She verbalizes understanding and agrees with the plan.  Severity of Illness: The appropriate patient status for this patient is INPATIENT. Inpatient status is judged to be reasonable and necessary in order to provide the required intensity of service to ensure the patient's safety. The patient's presenting symptoms, physical exam findings, and initial radiographic and laboratory data in the context of their chronic comorbidities is felt to place them at high risk for further clinical deterioration. Furthermore, it is not anticipated that the patient will be medically stable for discharge from the hospital within 2 midnights of admission.   * I certify that at the point of admission it is my clinical judgment that the patient will require inpatient hospital care spanning beyond 2 midnights from the point of admission due to high intensity of service, high risk for further deterioration and high frequency of surveillance required.*  Author: Collier Bullock, MD 11/04/2021 3:45 PM  For  on call review [email or web address].

## 2021-11-05 ENCOUNTER — Other Ambulatory Visit: Payer: Self-pay

## 2021-11-05 ENCOUNTER — Other Ambulatory Visit (HOSPITAL_COMMUNITY): Payer: Self-pay

## 2021-11-05 ENCOUNTER — Encounter (HOSPITAL_COMMUNITY): Payer: Self-pay | Admitting: Internal Medicine

## 2021-11-05 ENCOUNTER — Telehealth (HOSPITAL_COMMUNITY): Payer: Self-pay | Admitting: Pharmacy Technician

## 2021-11-05 DIAGNOSIS — I4891 Unspecified atrial fibrillation: Secondary | ICD-10-CM

## 2021-11-05 DIAGNOSIS — I10 Essential (primary) hypertension: Secondary | ICD-10-CM

## 2021-11-05 DIAGNOSIS — I5033 Acute on chronic diastolic (congestive) heart failure: Secondary | ICD-10-CM | POA: Diagnosis not present

## 2021-11-05 LAB — CBC
HCT: 45.7 % (ref 36.0–46.0)
Hemoglobin: 14.8 g/dL (ref 12.0–15.0)
MCH: 31.6 pg (ref 26.0–34.0)
MCHC: 32.4 g/dL (ref 30.0–36.0)
MCV: 97.4 fL (ref 80.0–100.0)
Platelets: 183 10*3/uL (ref 150–400)
RBC: 4.69 MIL/uL (ref 3.87–5.11)
RDW: 15 % (ref 11.5–15.5)
WBC: 6.9 10*3/uL (ref 4.0–10.5)
nRBC: 0 % (ref 0.0–0.2)

## 2021-11-05 LAB — BASIC METABOLIC PANEL
Anion gap: 11 (ref 5–15)
BUN: 24 mg/dL — ABNORMAL HIGH (ref 8–23)
CO2: 27 mmol/L (ref 22–32)
Calcium: 8.9 mg/dL (ref 8.9–10.3)
Chloride: 102 mmol/L (ref 98–111)
Creatinine, Ser: 1.35 mg/dL — ABNORMAL HIGH (ref 0.44–1.00)
GFR, Estimated: 38 mL/min — ABNORMAL LOW (ref >60)
Glucose, Bld: 137 mg/dL — ABNORMAL HIGH (ref 70–99)
Potassium: 3.4 mmol/L — ABNORMAL LOW (ref 3.5–5.1)
Sodium: 140 mmol/L (ref 135–145)

## 2021-11-05 LAB — TSH: TSH: 2.101 u[IU]/mL (ref 0.350–4.500)

## 2021-11-05 LAB — PROTIME-INR
INR: 3.2 — ABNORMAL HIGH (ref 0.8–1.2)
Prothrombin Time: 32.3 seconds — ABNORMAL HIGH (ref 11.4–15.2)

## 2021-11-05 MED ORDER — POTASSIUM CHLORIDE CRYS ER 20 MEQ PO TBCR
40.0000 meq | EXTENDED_RELEASE_TABLET | Freq: Every day | ORAL | Status: DC
  Administered 2021-11-05: 40 meq via ORAL
  Filled 2021-11-05: qty 2

## 2021-11-05 MED ORDER — WARFARIN SODIUM 2 MG PO TABS
2.0000 mg | ORAL_TABLET | Freq: Once | ORAL | Status: AC
  Administered 2021-11-05: 2 mg via ORAL
  Filled 2021-11-05: qty 1

## 2021-11-05 MED ORDER — FUROSEMIDE 10 MG/ML IJ SOLN
40.0000 mg | Freq: Three times a day (TID) | INTRAMUSCULAR | Status: DC
  Administered 2021-11-05 – 2021-11-06 (×2): 40 mg via INTRAVENOUS
  Filled 2021-11-05 (×2): qty 4

## 2021-11-05 MED ORDER — METOPROLOL TARTRATE 25 MG PO TABS
25.0000 mg | ORAL_TABLET | Freq: Two times a day (BID) | ORAL | Status: DC
  Administered 2021-11-05 (×2): 25 mg via ORAL
  Filled 2021-11-05 (×2): qty 1

## 2021-11-05 MED ORDER — UNABLE TO FIND: 0 refills | Status: DC

## 2021-11-05 NOTE — Progress Notes (Signed)
Mobility Specialist Progress Note   11/05/21 1244  Mobility  Activity Ambulated with assistance in hallway  Level of Assistance Minimal assist, patient does 75% or more  Assistive Device Front wheel walker  Distance Ambulated (ft) 125 ft  Activity Response Tolerated well  $Mobility charge 1 Mobility    Received pt in bed having no complaints and agreeable to mobility. Pt was asymptomatic throughout ambulation and returned to room w/o fault. Left in bed w/ call bell in reach and all needs met.   Lucious Groves Mobility Specialist

## 2021-11-05 NOTE — Telephone Encounter (Signed)
Pharmacy Patient Advocate Encounter  Insurance verification completed.    The patient is insured through [employer]   The patient is currently admitted and ran test claims for the following: Eliquis .  Copays and coinsurance results were relayed to Inpatient clinical team.

## 2021-11-05 NOTE — TOC Benefit Eligibility Note (Signed)
Patient Teacher, English as a foreign language completed.    The patient is currently admitted and upon discharge could be taking Eliquis 5 mg.  The current 30 day co-pay is $105.31.   The patient is insured through [place], [place] Patient Advocate Specialist [place] Patient Advocate Team Direct Number: [phone]  Fax: [phone]

## 2021-11-05 NOTE — Progress Notes (Addendum)
Progress Note  Patient Name: Alison Dunn Date of Encounter: 11/05/2021  [facility] Cardiologist: None   Subjective   Remains in atrial fibrillation with RVR in the 120's.  She put out 800cc yesterday and is net neg 305cc.  Inpatient Medications    Scheduled Meds:  acetaminophen  1,000 mg Oral BID   ascorbic acid  500 mg Oral Daily   calcium carbonate  1,250 mg Oral Q breakfast   cholecalciferol  1,000 Units Oral Daily   ferrous sulfate  325 mg Oral Q breakfast   furosemide  40 mg Intravenous BID   multivitamin with minerals  1 tablet Oral Daily   pantoprazole  40 mg Oral Daily   psyllium  1 packet Oral Daily   warfarin  2 mg Oral ONCE-1600   Warfarin - Pharmacist Dosing Inpatient   Does not apply q1600   Continuous Infusions:  diltiazem (CARDIZEM) infusion 15 mg/hr (11/05/21 0534)   PRN Meds: LORazepam, ondansetron **OR** ondansetron (ZOFRAN) IV   Vital Signs    Vitals:   11/05/21 0032 11/05/21 0033 11/05/21 0034 11/05/21 0430  BP: (!) 131/92   (!) 108/94  Pulse: (!) 108   71  Resp: 20 (!) 21 (!) 21 20  Temp: (!) 97.5 F (36.4 C)   (!) 97.1 F (36.2 C)  TempSrc: Oral   Axillary  SpO2: 100%   94%  Weight:    95 kg  Height:        Intake/Output Summary (Last 24 hours) at 11/05/2021 0827 Last data filed at 11/05/2021 0032 Gross per 24 hour  Intake 495 ml  Output 800 ml  Net -305 ml      11/05/2021    4:30 AM 11/04/2021    6:01 PM 11/04/2021   10:58 AM  Last 3 Weights  Weight (lbs) 209 lb 8 oz 206 lb 12.7 oz 210 lb 6.4 oz  Weight (kg) 95.029 kg 93.8 kg 95.437 kg      Telemetry    Atrial fibrillation with RVR - Personally Reviewed  ECG    No new EKG to review - Personally Reviewed  Physical Exam   GEN: No acute distress.   Neck: No JVD Cardiac: irregularly irregular, no murmurs, rubs, or gallops.  Respiratory: decreased BS at bases bilaterally GI: Soft, nontender, non-distended  MS: 2+ BLE edema; No deformity. Neuro:  Nonfocal  Psych:  Normal affect   Labs    High Sensitivity Troponin:  No results for input(s): "TROPONINIHS" in the last 720 hours.    Chemistry Recent Labs  Lab 11/04/21 1244 11/05/21 0508  NA 140 140  K 3.8 3.4*  CL 103 102  CO2 27 27  GLUCOSE 134* 137*  BUN 27* 24*  CREATININE 1.42* 1.35*  CALCIUM 9.1 8.9  GFRNONAA 36* 38*  ANIONGAP 10 11     Hematology Recent Labs  Lab 11/04/21 1244 11/05/21 0508  WBC 8.2 6.9  RBC 4.52 4.69  HGB 14.3 14.8  HCT 45.5 45.7  MCV 100.7* 97.4  MCH 31.6 31.6  MCHC 31.4 32.4  RDW 15.0 15.0  PLT 218 183    BNP Recent Labs  Lab 11/04/21 1244  BNP 669.9*     DDimer No results for input(s): "DDIMER" in the last 168 hours.   CHA2DS2-VASc Score = 7   This indicates a 11.2% annual risk of stroke. The patient's score is based upon: CHF History: 1 HTN History: 1 Diabetes History: 0 Stroke History: 2 Vascular Disease History: 0  Age Score: 2 Gender Score: 1      Radiology    ECHOCARDIOGRAM COMPLETE  Result Date: 11/04/2021    ECHOCARDIOGRAM REPORT   Patient Name:   Alison Dunn Date of Exam: 11/04/2021 Medical Rec #:  [id number]    Height:       65.0 in Accession #:    [id number]   Weight:       210.4 lb Date of Birth:  09-12-1931     BSA:          2.021 m Patient Age:    17 years     BP:           147/106 mmHg Patient Gender: F            HR:           115 bpm. Exam Location:  Inpatient Procedure: 2D Echo, Cardiac Doppler and Color Doppler Indications:    CHF  History:        Patient has no prior history of Echocardiogram examinations.                 Arrythmias:Atrial Fibrillation, Signs/Symptoms:Shortness of                 Breath; Risk Factors:Hypertension.  Sonographer:    Greer Pickerel Sonographer#2:  Raquel Sarna Senior Referring Phys: [id number] AGBATA  Sonographer Comments: Technically difficult study due to poor echo windows. Image acquisition challenging due to respiratory motion and Image acquisition challenging due to patient body habitus.  IMPRESSIONS  1. Left ventricular ejection fraction, by estimation, is 50 to 55%. The left ventricle has low normal function. The left ventricle has no regional wall motion abnormalities. Left ventricular diastolic function could not be evaluated.  2. Right ventricular systolic function is normal. The right ventricular size is mildly enlarged. There is normal pulmonary artery systolic pressure. The estimated right ventricular systolic pressure is 53.6 mmHg.  3. Left atrial size was moderately dilated.  4. Right atrial size was severely dilated.  5. The mitral valve is grossly normal. Moderate mitral valve regurgitation. No evidence of mitral stenosis.  6. Tricuspid valve regurgitation is mild to moderate.  7. The aortic valve is tricuspid. There is moderate calcification of the aortic valve. There is moderate thickening of the aortic valve. Aortic valve regurgitation is not visualized. Aortic valve sclerosis/calcification is present, without any evidence of aortic stenosis.  8. The inferior vena cava is normal in size with <50% respiratory variability, suggesting right atrial pressure of 8 mmHg. FINDINGS  Left Ventricle: Left ventricular ejection fraction, by estimation, is 50 to 55%. The left ventricle has low normal function. The left ventricle has no regional wall motion abnormalities. The left ventricular internal cavity size was normal in size. There is no left ventricular hypertrophy. Left ventricular diastolic function could not be evaluated due to atrial fibrillation. Left ventricular diastolic function could not be evaluated. Right Ventricle: The right ventricular size is mildly enlarged. No increase in right ventricular wall thickness. Right ventricular systolic function is normal. There is normal pulmonary artery systolic pressure. The tricuspid regurgitant velocity is 2.34  m/s, and with an assumed right atrial pressure of 8 mmHg, the estimated right ventricular systolic pressure is 14.4 mmHg. Left  Atrium: Left atrial size was moderately dilated. Right Atrium: Right atrial size was severely dilated. Pericardium: There is no evidence of pericardial effusion. Presence of epicardial fat layer. Mitral Valve: The mitral valve is grossly normal. Moderate mitral valve regurgitation. No evidence of  mitral valve stenosis. Tricuspid Valve: The tricuspid valve is grossly normal. Tricuspid valve regurgitation is mild to moderate. No evidence of tricuspid stenosis. Aortic Valve: The aortic valve is tricuspid. There is moderate calcification of the aortic valve. There is moderate thickening of the aortic valve. Aortic valve regurgitation is not visualized. Aortic valve sclerosis/calcification is present, without any  evidence of aortic stenosis. Pulmonic Valve: The pulmonic valve was grossly normal. Pulmonic valve regurgitation is not visualized. No evidence of pulmonic stenosis. Aorta: The aortic root and ascending aorta are structurally normal, with no evidence of dilitation. Venous: The inferior vena cava is normal in size with less than 50% respiratory variability, suggesting right atrial pressure of 8 mmHg. IAS/Shunts: The atrial septum is grossly normal.  LEFT VENTRICLE PLAX 2D LVIDd:         4.17 cm LVIDs:         3.92 cm LV PW:         0.90 cm LV IVS:        0.97 cm LVOT diam:     1.70 cm LV SV:         16 LV SV Index:   8 LVOT Area:     2.27 cm  LV Volumes (MOD) LV vol d, MOD A2C: 38.6 ml LV vol d, MOD A4C: 63.1 ml LV vol s, MOD A2C: 20.5 ml LV vol s, MOD A4C: 30.4 ml LV SV MOD A2C:     18.1 ml LV SV MOD A4C:     63.1 ml LV SV MOD BP:      24.3 ml RIGHT VENTRICLE RV Basal diam:  3.90 cm TAPSE (M-mode): 0.8 cm LEFT ATRIUM             Index        RIGHT ATRIUM           Index LA diam:        4.40 cm 2.18 cm/m   RA Area:     33.10 cm LA Vol (A2C):   99.8 ml 49.38 ml/m  RA Volume:   115.00 ml 56.90 ml/m LA Vol (A4C):   90.5 ml 44.78 ml/m LA Biplane Vol: 99.0 ml 48.98 ml/m  AORTIC VALVE LVOT Vmax:   51.70 cm/s  LVOT Vmean:  34.200 cm/s LVOT VTI:    0.070 m  AORTA Ao Root diam: 3.30 cm Ao Asc diam:  2.90 cm TRICUSPID VALVE TR Peak grad:   21.9 mmHg TR Vmax:        234.00 cm/s  SHUNTS Systemic VTI:  0.07 m Systemic Diam: 1.70 cm Eleonore Chiquito MD Electronically signed by Eleonore Chiquito MD Signature Date/Time: 11/04/2021/4:50:22 PM    Final    DG Chest 1 View  Result Date: 11/04/2021 CLINICAL DATA:  Shortness of breath weight gain and lower extremity edema. EXAM: CHEST  1 VIEW COMPARISON:  Chest CT 09/04/2014 FINDINGS: The cardiac silhouette, mediastinal and hilar contours are within normal limits for age and given the AP projection. Underlying emphysematous changes. No vascular congestion or pulmonary edema. There are small bilateral pleural effusions and bibasilar atelectasis. IMPRESSION: Small bilateral pleural effusions and bibasilar atelectasis. Electronically Signed   By: Marijo Sanes M.D.   On: 11/04/2021 13:10    Cardiac Studies   2D echo 11/04/2021 IMPRESSIONS    1. Left ventricular ejection fraction, by estimation, is 50 to 55%. The  left ventricle has low normal function. The left ventricle has no regional  wall motion abnormalities. Left ventricular diastolic function could  not  be evaluated.   2. Right ventricular systolic function is normal. The right ventricular  size is mildly enlarged. There is normal pulmonary artery systolic  pressure. The estimated right ventricular systolic pressure is 06.3 mmHg.   3. Left atrial size was moderately dilated.   4. Right atrial size was severely dilated.   5. The mitral valve is grossly normal. Moderate mitral valve  regurgitation. No evidence of mitral stenosis.   6. Tricuspid valve regurgitation is mild to moderate.   7. The aortic valve is tricuspid. There is moderate calcification of the  aortic valve. There is moderate thickening of the aortic valve. Aortic  valve regurgitation is not visualized. Aortic valve  sclerosis/calcification is present,  without any evidence  of aortic stenosis.   8. The inferior vena cava is normal in size with <50% respiratory  variability, suggesting right atrial pressure of 8 mmHg.   Patient Profile     88 y.o. female  with a hx of HTN, [place] lung CA s/p lobectomy w/ no further rx needed, bilat PE 2011 & 2013 on coumadin, who is being seen 11/04/2021 for the evaluation of CHF and atrial fib at the request of Dr Francine Graven  Assessment & Plan    Atrial Fib with RVR - unknown duration, but likely several weeks - This patients CHA2DS2-VASc Score and unadjusted Ischemic Stroke Rate (% per year) is equal to 11.2 % stroke rate/year from a score of 7 Above score calculated as 1 point each if present [CHF, HTN, DM, Vascular=MI/PAD/Aortic Plaque, Age if 65-74, or Female] Above score calculated as 2 points each if present [Age > 75, or Stroke/TIA/TE]  - TSH pending - 2D echo with low normal LVF EF 50-55% with moderate MR - started on IV Cardizem gtt yesterday but HR still elevated  - at home on Cardizem and Propranolol which have been stopped - continue IV Cardizem gtt - start Lopressor 25mg  BID - continue warfarin>>INR 3.2 today - will make NPO after MN tomorrow for TEE/DCCV Friday (earliest we can get scheduled) - would recommend transitioning from warfarin to Eliquis outpt given her advanced age to reduce risk of bleeding   2. Acute diastolic CHF -BNP is elevated, weight gain > 10lbs, marked LE edema -now on Lasix 40mg  IV BID -she put out 800cc yesterday with only net neg 305cc since admit ? If accurate -SCr improved today to 1.35 from 1.42 -remains volume overloaded on exam today -increase lasix to 40mg  TID -K+ 3.4 >> replete to keep > 4 -Check Mag level -follow renal function, I&O's and daily weights while diuresing   3. LLE Cellulitis - her L leg is always swollen, but it is worse now -She has discoloration, erythema and blisters as well -Antibiotics and management per IM, but she may benefit from an  Unna boot or wound consult  4.  HTN -DBP remains elevated -starting Lopressor 25mg  BID -continue IV Cardizem Gtt    I have spent a total of 30 minutes with patient reviewing 2D echo , telemetry, EKGs, labs and examining patient as well as establishing an assessment and plan that was discussed with the patient.  > 50% of time was spent in direct patient care.     For questions or updates, please contact [place] Please consult [email or web address] for contact info under        Signed, Fransico Him, MD  11/05/2021, 8:27 AM

## 2021-11-05 NOTE — Progress Notes (Addendum)
PROGRESS NOTE    Alison Dunn  PYK:[id number] DOB: [birth year] DOA: 11/04/2021 PCP: Alison Manes, MD  88/F with history of non-small cell lung cancer status post lobectomy, hypertension, bilateral PE in 2011 and 2013 on Coumadin, presented to the ED with progressive dyspnea on exertion, noted to be in A-fib RVR with fluid overload   Subjective: -Breathing improving  Assessment and Plan:  Acute on chronic diastolic CHF Most likely related to rapid A-fib -Continue IV Lasix, she is only 300 mL net negative, unclear if accurate -Agree with increase Lasix dose, replete potassium -Echo noted EF of 50-55% with no regional wall motion abnormalities, diastolic parameters could not be evaluated -Add Aldactone  A-fib with RVR -Heart rate suboptimally controlled, echo with preserved EF -Remains on Cardizem gtt., home dose of propranolol discontinued -Starting Lopressor, plan for TEE cardioversion, remains on Coumadin INR therapeutic  Left leg skin rash -present for >5years and unchanged, followed by Derm Dr.Jones -Do not suspect infection  Hypertension -Continue diuretics as above, metoprolol  Obesity (BMI 30-39.9) BMI 35 Complicates overall prognosis and care  Non-small cell lung cancer ([place]) Status post left upper lobectomy Stable  DVT prophylaxis: Warfarin Code Status: Full code Family Communication: Discussed with patient detail, no family at bedside Disposition Plan: Home improvement  Consultants: Cards   Procedures:   Antimicrobials:    Objective: Vitals:   11/05/21 0859 11/05/21 0949 11/05/21 1232 11/05/21 1258  BP: (!) 130/99  125/82   Pulse: (!) 118  (!) 116   Resp: (!) 21 20 (!) 25 20  Temp: (!) 97.5 F (36.4 C)  97.9 F (36.6 C)   TempSrc: Oral  Oral   SpO2: 95%  95%   Weight:      Height:        Intake/Output Summary (Last 24 hours) at 11/05/2021 1320 Last data filed at 11/05/2021 1000 Gross per 24 hour  Intake 848.68 ml  Output 800 ml  Net 48.68  ml   Filed Weights   11/04/21 1801 11/05/21 0430  Weight: 93.8 kg 95 kg    Examination:  General exam: Obese female sitting up in bed, AAOx3, no distress HEENT: No JVD CVS: S1-S2, irregularly irregular rhythm Lungs: Decreased breath sounds to bases Abdomen: Soft, nontender, bowel sounds present Extremities: 1+ edema  Skin: L leg with areas of discoloration, scaling Psychiatry:  Mood & affect appropriate.     Data Reviewed:   CBC: Recent Labs  Lab 11/04/21 1244 11/05/21 0508  WBC 8.2 6.9  NEUTROABS 5.4  --   HGB 14.3 14.8  HCT 45.5 45.7  MCV 100.7* 97.4  PLT 218 539   Basic Metabolic Panel: Recent Labs  Lab 11/04/21 1244 11/05/21 0508  NA 140 140  K 3.8 3.4*  CL 103 102  CO2 27 27  GLUCOSE 134* 137*  BUN 27* 24*  CREATININE 1.42* 1.35*  CALCIUM 9.1 8.9   GFR: Estimated Creatinine Clearance: 32.8 mL/min (A) (by C-G formula based on SCr of 1.35 mg/dL (H)). Liver Function Tests: No results for input(s): "AST", "ALT", "ALKPHOS", "BILITOT", "PROT", "ALBUMIN" in the last 168 hours. No results for input(s): "LIPASE", "AMYLASE" in the last 168 hours. No results for input(s): "AMMONIA" in the last 168 hours. Coagulation Profile: Recent Labs  Lab 11/04/21 1244 11/05/21 0508  INR 3.3* 3.2*   Cardiac Enzymes: No results for input(s): "CKTOTAL", "CKMB", "CKMBINDEX", "TROPONINI" in the last 168 hours. BNP (last 3 results) No results for input(s): "PROBNP" in the last 8760 hours. HbA1C: No  results for input(s): "HGBA1C" in the last 72 hours. CBG: No results for input(s): "GLUCAP" in the last 168 hours. Lipid Profile: No results for input(s): "CHOL", "HDL", "LDLCALC", "TRIG", "CHOLHDL", "LDLDIRECT" in the last 72 hours. Thyroid Function Tests: No results for input(s): "TSH", "T4TOTAL", "FREET4", "T3FREE", "THYROIDAB" in the last 72 hours. Anemia Panel: No results for input(s): "VITAMINB12", "FOLATE", "FERRITIN", "TIBC", "IRON", "RETICCTPCT" in the last 72  hours. Urine analysis:    Component Value Date/Time   COLORURINE YELLOW 06/21/2009 0855   APPEARANCEUR CLOUDY (A) 06/21/2009 0855   LABSPEC 1.022 06/21/2009 0855   PHURINE 6.5 06/21/2009 0855   GLUCOSEU NEGATIVE 06/21/2009 0855   HGBUR NEGATIVE 06/21/2009 0855   BILIRUBINUR NEGATIVE 06/21/2009 0855   KETONESUR NEGATIVE 06/21/2009 0855   PROTEINUR NEGATIVE 06/21/2009 0855   UROBILINOGEN 0.2 06/21/2009 0855   NITRITE NEGATIVE 06/21/2009 0855   LEUKOCYTESUR SMALL (A) 06/21/2009 0855   Sepsis Labs: @LABRCNTIP (procalcitonin:4,lacticidven:4)  )No results found for this or any previous visit (from the past 240 hour(s)).   Radiology Studies: ECHOCARDIOGRAM COMPLETE  Result Date: 11/04/2021    ECHOCARDIOGRAM REPORT   Patient Name:   Alison Dunn Date of Exam: 11/04/2021 Medical Rec #:  [id number]    Height:       65.0 in Accession #:    [id number]   Weight:       210.4 lb Date of Birth:  [birth year]     BSA:          2.021 m Patient Age:    88 years     BP:           147/106 mmHg Patient Gender: F            HR:           115 bpm. Exam Location:  Inpatient Procedure: 2D Echo, Cardiac Doppler and Color Doppler Indications:    CHF  History:        Patient has no prior history of Echocardiogram examinations.                 Arrythmias:Atrial Fibrillation, Signs/Symptoms:Shortness of                 Breath; Risk Factors:Hypertension.  Sonographer:    Greer Pickerel Sonographer#2:  Raquel Sarna Senior Referring Phys: [id number] AGBATA  Sonographer Comments: Technically difficult study due to poor echo windows. Image acquisition challenging due to respiratory motion and Image acquisition challenging due to patient body habitus. IMPRESSIONS  1. Left ventricular ejection fraction, by estimation, is 50 to 55%. The left ventricle has low normal function. The left ventricle has no regional wall motion abnormalities. Left ventricular diastolic function could not be evaluated.  2. Right ventricular systolic function is  normal. The right ventricular size is mildly enlarged. There is normal pulmonary artery systolic pressure. The estimated right ventricular systolic pressure is 02.5 mmHg.  3. Left atrial size was moderately dilated.  4. Right atrial size was severely dilated.  5. The mitral valve is grossly normal. Moderate mitral valve regurgitation. No evidence of mitral stenosis.  6. Tricuspid valve regurgitation is mild to moderate.  7. The aortic valve is tricuspid. There is moderate calcification of the aortic valve. There is moderate thickening of the aortic valve. Aortic valve regurgitation is not visualized. Aortic valve sclerosis/calcification is present, without any evidence of aortic stenosis.  8. The inferior vena cava is normal in size with <50% respiratory variability, suggesting right atrial pressure of 8 mmHg. FINDINGS  Left Ventricle:  Left ventricular ejection fraction, by estimation, is 50 to 55%. The left ventricle has low normal function. The left ventricle has no regional wall motion abnormalities. The left ventricular internal cavity size was normal in size. There is no left ventricular hypertrophy. Left ventricular diastolic function could not be evaluated due to atrial fibrillation. Left ventricular diastolic function could not be evaluated. Right Ventricle: The right ventricular size is mildly enlarged. No increase in right ventricular wall thickness. Right ventricular systolic function is normal. There is normal pulmonary artery systolic pressure. The tricuspid regurgitant velocity is 2.34  m/s, and with an assumed right atrial pressure of 8 mmHg, the estimated right ventricular systolic pressure is 90.2 mmHg. Left Atrium: Left atrial size was moderately dilated. Right Atrium: Right atrial size was severely dilated. Pericardium: There is no evidence of pericardial effusion. Presence of epicardial fat layer. Mitral Valve: The mitral valve is grossly normal. Moderate mitral valve regurgitation. No evidence  of mitral valve stenosis. Tricuspid Valve: The tricuspid valve is grossly normal. Tricuspid valve regurgitation is mild to moderate. No evidence of tricuspid stenosis. Aortic Valve: The aortic valve is tricuspid. There is moderate calcification of the aortic valve. There is moderate thickening of the aortic valve. Aortic valve regurgitation is not visualized. Aortic valve sclerosis/calcification is present, without any  evidence of aortic stenosis. Pulmonic Valve: The pulmonic valve was grossly normal. Pulmonic valve regurgitation is not visualized. No evidence of pulmonic stenosis. Aorta: The aortic root and ascending aorta are structurally normal, with no evidence of dilitation. Venous: The inferior vena cava is normal in size with less than 50% respiratory variability, suggesting right atrial pressure of 8 mmHg. IAS/Shunts: The atrial septum is grossly normal.  LEFT VENTRICLE PLAX 2D LVIDd:         4.17 cm LVIDs:         3.92 cm LV PW:         0.90 cm LV IVS:        0.97 cm LVOT diam:     1.70 cm LV SV:         16 LV SV Index:   8 LVOT Area:     2.27 cm  LV Volumes (MOD) LV vol d, MOD A2C: 38.6 ml LV vol d, MOD A4C: 63.1 ml LV vol s, MOD A2C: 20.5 ml LV vol s, MOD A4C: 30.4 ml LV SV MOD A2C:     18.1 ml LV SV MOD A4C:     63.1 ml LV SV MOD BP:      24.3 ml RIGHT VENTRICLE RV Basal diam:  3.90 cm TAPSE (M-mode): 0.8 cm LEFT ATRIUM             Index        RIGHT ATRIUM           Index LA diam:        4.40 cm 2.18 cm/m   RA Area:     33.10 cm LA Vol (A2C):   99.8 ml 49.38 ml/m  RA Volume:   115.00 ml 56.90 ml/m LA Vol (A4C):   90.5 ml 44.78 ml/m LA Biplane Vol: 99.0 ml 48.98 ml/m  AORTIC VALVE LVOT Vmax:   51.70 cm/s LVOT Vmean:  34.200 cm/s LVOT VTI:    0.070 m  AORTA Ao Root diam: 3.30 cm Ao Asc diam:  2.90 cm TRICUSPID VALVE TR Peak grad:   21.9 mmHg TR Vmax:        234.00 cm/s  SHUNTS Systemic VTI:  0.07  m Systemic Diam: 1.70 cm Eleonore Chiquito MD Electronically signed by Eleonore Chiquito MD Signature  Date/Time: 11/04/2021/4:50:22 PM    Final    DG Chest 1 View  Result Date: 11/04/2021 CLINICAL DATA:  Shortness of breath weight gain and lower extremity edema. EXAM: CHEST  1 VIEW COMPARISON:  Chest CT 09/04/2014 FINDINGS: The cardiac silhouette, mediastinal and hilar contours are within normal limits for age and given the AP projection. Underlying emphysematous changes. No vascular congestion or pulmonary edema. There are small bilateral pleural effusions and bibasilar atelectasis. IMPRESSION: Small bilateral pleural effusions and bibasilar atelectasis. Electronically Signed   By: Marijo Sanes M.D.   On: 11/04/2021 13:10     Scheduled Meds:  acetaminophen  1,000 mg Oral BID   ascorbic acid  500 mg Oral Daily   calcium carbonate  1,250 mg Oral Q breakfast   cholecalciferol  1,000 Units Oral Daily   ferrous sulfate  325 mg Oral Q breakfast   furosemide  40 mg Intravenous Q8H   metoprolol tartrate  25 mg Oral BID   multivitamin with minerals  1 tablet Oral Daily   pantoprazole  40 mg Oral Daily   potassium chloride  40 mEq Oral Daily   psyllium  1 packet Oral Daily   warfarin  2 mg Oral ONCE-1600   Warfarin - Pharmacist Dosing Inpatient   Does not apply q1600   Continuous Infusions:  diltiazem (CARDIZEM) infusion 15 mg/hr (11/05/21 0839)     LOS: 1 day    Time spent: 20min    Domenic Polite, MD [facility]   11/05/2021, 1:20 PM

## 2021-11-05 NOTE — TOC Progression Note (Addendum)
Transition of Care [facility]) - Progression Note    Patient Details  Name: TYLESHA GIBEAULT MRN: [id number] Date of Birth: 1931-06-09  Transition of Care [facility]) CM/SW Contact  Zenon Mayo, RN Phone Number: 11/05/2021, 3:32 PM  Clinical Narrative:    Patient is from home alone, she is pretty indep, still drives, her son and DIL is at the bedside, states she has been on coumadin for 20 years and her PCP checks her PT/INR.  She uses a cane when out and about, she states she weighs herself daily, she does not check her bp daily but will start to , she has been eating a lot of fast foods but states that is about to change, she will start doing better with her diet.  She gets meds from [facility] in [place] and would like her scripts to be sent there at dc.  She presents with acute CHF, SOB. Afib, conts on cardizem drip, plan for cardioversion on Friday.  She would also like doctor to write a script for  a lift chair for her.  MD printed script off on printer on unit. NCM gave script to son in the room.   TOC following.         Expected Discharge Plan and Services                                                 Social Determinants of Health (SDOH) Interventions    Readmission Risk Interventions     No data to display

## 2021-11-05 NOTE — Progress Notes (Signed)
ANTICOAGULATION CONSULT NOTE - Follow Up Consult  Pharmacy Consult for warfarin Indication: atrial fibrillation and PMH pulmonary embolus  Allergies  Allergen Reactions   Codeine Nausea And Vomiting   Vicodin [Hydrocodone-Acetaminophen] Other (See Comments)    Doesn't like how it makes her feel    Patient Measurements: Height: 5\' 5"  (165.1 cm) Weight: 95 kg (209 lb 8 oz) IBW/kg (Calculated) : 57  Vital Signs: Temp: 97.1 F (36.2 C) (09/13 0430) Temp Source: Axillary (09/13 0430) BP: 108/94 (09/13 0430) Pulse Rate: 71 (09/13 0430)  Labs: Recent Labs    11/04/21 1244 11/05/21 0508  HGB 14.3 14.8  HCT 45.5 45.7  PLT 218 183  LABPROT 33.0* 32.3*  INR 3.3* 3.2*  CREATININE 1.42* 1.35*    Estimated Creatinine Clearance: 32.8 mL/min (A) (by C-G formula based on SCr of 1.35 mg/dL (H)).   Medications:  Scheduled:   acetaminophen  1,000 mg Oral BID   ascorbic acid  500 mg Oral Daily   calcium carbonate  1,250 mg Oral Q breakfast   cholecalciferol  1,000 Units Oral Daily   ferrous sulfate  325 mg Oral Q breakfast   furosemide  40 mg Intravenous BID   multivitamin with minerals  1 tablet Oral Daily   pantoprazole  40 mg Oral Daily   psyllium  1 packet Oral Daily   Warfarin - Pharmacist Dosing Inpatient   Does not apply q1600   Infusions:   diltiazem (CARDIZEM) infusion 15 mg/hr (11/05/21 0534)    Assessment: 88 YO F with PMH afib and bilateral PE in 2011 and 2013. Pt is on warfarin last dose 11/03/21 @4pm  PTA. Pharmacy consulted to dose warfarin while inpatient.   Warfarin regimen PTA: 3mg  daily  9/12 INR 3.3 9/13 INR 3.2    Goal of Therapy:  INR 2-3 Monitor platelets by anticoagulation protocol: Yes   Plan:  Warfarin 2mg  by mouth x1 today F/u s/sx bleeding, CBC Daily INR    Wilson Singer, PharmD Clinical Pharmacist 11/05/2021 7:43 AM

## 2021-11-05 NOTE — Progress Notes (Signed)
Heart Failure Navigator Progress Note  Following this hospitalization to assess for HV TOC readiness.   EF 50-55%  A Fib - RVR - Cardizm Gtt  Arnika Larzelere, BSN, Clinical cytogeneticist Only

## 2021-11-06 ENCOUNTER — Encounter (HOSPITAL_COMMUNITY): Payer: Self-pay | Admitting: Internal Medicine

## 2021-11-06 ENCOUNTER — Inpatient Hospital Stay (HOSPITAL_COMMUNITY): Payer: Medicare Other

## 2021-11-06 ENCOUNTER — Inpatient Hospital Stay (HOSPITAL_COMMUNITY): Payer: Medicare Other | Admitting: Certified Registered Nurse Anesthetist

## 2021-11-06 ENCOUNTER — Encounter (HOSPITAL_COMMUNITY)
Admission: EM | Disposition: A | Discharge status: Home / Self Care | Payer: Self-pay | Source: Home / Self Care | Attending: Internal Medicine

## 2021-11-06 DIAGNOSIS — I110 Hypertensive heart disease with heart failure: Secondary | ICD-10-CM | POA: Diagnosis not present

## 2021-11-06 DIAGNOSIS — I4891 Unspecified atrial fibrillation: Secondary | ICD-10-CM

## 2021-11-06 DIAGNOSIS — Q2112 Patent foramen ovale: Secondary | ICD-10-CM | POA: Diagnosis not present

## 2021-11-06 DIAGNOSIS — I340 Nonrheumatic mitral (valve) insufficiency: Secondary | ICD-10-CM

## 2021-11-06 DIAGNOSIS — I081 Rheumatic disorders of both mitral and tricuspid valves: Secondary | ICD-10-CM | POA: Diagnosis not present

## 2021-11-06 DIAGNOSIS — I5033 Acute on chronic diastolic (congestive) heart failure: Secondary | ICD-10-CM | POA: Diagnosis not present

## 2021-11-06 DIAGNOSIS — I10 Essential (primary) hypertension: Secondary | ICD-10-CM | POA: Diagnosis not present

## 2021-11-06 HISTORY — PX: TEE WITHOUT CARDIOVERSION: SHX5443

## 2021-11-06 HISTORY — PX: CARDIOVERSION: SHX1299

## 2021-11-06 LAB — PROTIME-INR
INR: 3.1 — ABNORMAL HIGH (ref 0.8–1.2)
Prothrombin Time: 31.9 seconds — ABNORMAL HIGH (ref 11.4–15.2)

## 2021-11-06 LAB — CBC
HCT: 42.7 % (ref 36.0–46.0)
Hemoglobin: 13.9 g/dL (ref 12.0–15.0)
MCH: 32 pg (ref 26.0–34.0)
MCHC: 32.6 g/dL (ref 30.0–36.0)
MCV: 98.2 fL (ref 80.0–100.0)
Platelets: 183 10*3/uL (ref 150–400)
RBC: 4.35 MIL/uL (ref 3.87–5.11)
RDW: 14.9 % (ref 11.5–15.5)
WBC: 6.6 10*3/uL (ref 4.0–10.5)
nRBC: 0 % (ref 0.0–0.2)

## 2021-11-06 LAB — BASIC METABOLIC PANEL
Anion gap: 12 (ref 5–15)
BUN: 27 mg/dL — ABNORMAL HIGH (ref 8–23)
CO2: 27 mmol/L (ref 22–32)
Calcium: 8.8 mg/dL — ABNORMAL LOW (ref 8.9–10.3)
Chloride: 98 mmol/L (ref 98–111)
Creatinine, Ser: 1.63 mg/dL — ABNORMAL HIGH (ref 0.44–1.00)
GFR, Estimated: 30 mL/min — ABNORMAL LOW (ref >60)
Glucose, Bld: 133 mg/dL — ABNORMAL HIGH (ref 70–99)
Potassium: 3.2 mmol/L — ABNORMAL LOW (ref 3.5–5.1)
Sodium: 137 mmol/L (ref 135–145)

## 2021-11-06 LAB — ECHO TEE
MV M vel: 3.47 m/s
MV Peak grad: 48.2 mmHg
Radius: 0.55 cm

## 2021-11-06 SURGERY — ECHOCARDIOGRAM, TRANSESOPHAGEAL
Anesthesia: Monitor Anesthesia Care

## 2021-11-06 MED ORDER — PHENYLEPHRINE HCL-NACL 20-0.9 MG/250ML-% IV SOLN
INTRAVENOUS | Status: DC | PRN
  Administered 2021-11-06: 25 ug/min via INTRAVENOUS

## 2021-11-06 MED ORDER — WARFARIN SODIUM 3 MG PO TABS
3.0000 mg | ORAL_TABLET | Freq: Once | ORAL | Status: AC
  Administered 2021-11-06: 3 mg via ORAL
  Filled 2021-11-06: qty 1

## 2021-11-06 MED ORDER — LACTATED RINGERS IV SOLN: INTRAVENOUS | Status: DC | PRN

## 2021-11-06 MED ORDER — POTASSIUM CHLORIDE CRYS ER 20 MEQ PO TBCR
40.0000 meq | EXTENDED_RELEASE_TABLET | Freq: Three times a day (TID) | ORAL | Status: AC
  Administered 2021-11-06 (×2): 40 meq via ORAL
  Filled 2021-11-06 (×3): qty 2

## 2021-11-06 MED ORDER — FUROSEMIDE 40 MG PO TABS
40.0000 mg | ORAL_TABLET | Freq: Every day | ORAL | Status: DC
  Administered 2021-11-07: 40 mg via ORAL
  Filled 2021-11-06: qty 1

## 2021-11-06 MED ORDER — PROPOFOL 500 MG/50ML IV EMUL
INTRAVENOUS | Status: DC | PRN
  Administered 2021-11-06: 150 ug/kg/min via INTRAVENOUS

## 2021-11-06 MED ORDER — METOPROLOL TARTRATE 50 MG PO TABS
50.0000 mg | ORAL_TABLET | Freq: Two times a day (BID) | ORAL | Status: DC
  Administered 2021-11-06 – 2021-11-07 (×3): 50 mg via ORAL
  Filled 2021-11-06 (×3): qty 1

## 2021-11-06 MED ORDER — LIDOCAINE 2% (20 MG/ML) 5 ML SYRINGE
INTRAMUSCULAR | Status: DC | PRN
  Administered 2021-11-06: 100 mg via INTRAVENOUS

## 2021-11-06 MED ORDER — PHENYLEPHRINE 80 MCG/ML (10ML) SYRINGE FOR IV PUSH (FOR BLOOD PRESSURE SUPPORT)
PREFILLED_SYRINGE | INTRAVENOUS | Status: DC | PRN
  Administered 2021-11-06 (×3): 160 ug via INTRAVENOUS

## 2021-11-06 NOTE — Progress Notes (Addendum)
Progress Note  Patient Name: Alison Dunn Date of Encounter: 11/06/2021  [facility] HeartCare Cardiologist: None   Subjective  Remains in atrial fibrillation with RVR in the 120s.  Inpatient Medications    Scheduled Meds:  acetaminophen  1,000 mg Oral BID   ascorbic acid  500 mg Oral Daily   calcium carbonate  1,250 mg Oral Q breakfast   cholecalciferol  1,000 Units Oral Daily   ferrous sulfate  325 mg Oral Q breakfast   furosemide  40 mg Intravenous Q8H   metoprolol tartrate  25 mg Oral BID   multivitamin with minerals  1 tablet Oral Daily   pantoprazole  40 mg Oral Daily   potassium chloride  40 mEq Oral TID   psyllium  1 packet Oral Daily   warfarin  3 mg Oral ONCE-1600   Warfarin - Pharmacist Dosing Inpatient   Does not apply q1600   Continuous Infusions:  diltiazem (CARDIZEM) infusion 15 mg/hr (11/06/21 0112)   PRN Meds: LORazepam, ondansetron **OR** ondansetron (ZOFRAN) IV   Vital Signs    Vitals:   11/05/21 1444 11/05/21 1933 11/06/21 0032 11/06/21 0514  BP:  (!) 152/101 (!) 132/92 130/75  Pulse: 86 99 (!) 127 (!) 127  Resp:  19 16 19   Temp:  (!) 97.4 F (36.3 C) (!) 97.4 F (36.3 C) (!) 97.4 F (36.3 C)  TempSrc:  Oral Oral Oral  SpO2:  96% 97%   Weight:    90.7 kg  Height:        Intake/Output Summary (Last 24 hours) at 11/06/2021 0810 Last data filed at 11/06/2021 0516 Gross per 24 hour  Intake 820.55 ml  Output 1000 ml  Net -179.45 ml       11/06/2021    5:14 AM 11/05/2021    4:30 AM 11/04/2021    6:01 PM  Last 3 Weights  Weight (lbs) 200 lb 209 lb 8 oz 206 lb 12.7 oz  Weight (kg) 90.719 kg 95.029 kg 93.8 kg      Telemetry    Atrial fibrillation with RVR- Personally Reviewed  ECG    No new EKG to review - Personally Reviewed  Physical Exam   GEN: Well nourished, well developed in no acute distress HEENT: Normal NECK: No JVD; No carotid bruits LYMPHATICS: No lymphadenopathy CARDIAC:irregularly irregular and tachy, no murmurs, rubs,  gallops RESPIRATORY:  Clear to auscultation without rales, wheezing or rhonchi  ABDOMEN: Soft, non-tender, non-distended MUSCULOSKELETAL:  1+ LE edema; No deformity  SKIN: Warm and dry NEUROLOGIC:  Alert and oriented x 3 PSYCHIATRIC:  Normal affect   Labs    High Sensitivity Troponin:  No results for input(s): "TROPONINIHS" in the last 720 hours.    Chemistry Recent Labs  Lab 11/04/21 1244 11/05/21 0508 11/06/21 0328  NA 140 140 137  K 3.8 3.4* 3.2*  CL 103 102 98  CO2 27 27 27   GLUCOSE 134* 137* 133*  BUN 27* 24* 27*  CREATININE 1.42* 1.35* 1.63*  CALCIUM 9.1 8.9 8.8*  GFRNONAA 36* 38* 30*  ANIONGAP 10 11 12       Hematology Recent Labs  Lab 11/04/21 1244 11/05/21 0508 11/06/21 0328  WBC 8.2 6.9 6.6  RBC 4.52 4.69 4.35  HGB 14.3 14.8 13.9  HCT 45.5 45.7 42.7  MCV 100.7* 97.4 98.2  MCH 31.6 31.6 32.0  MCHC 31.4 32.4 32.6  RDW 15.0 15.0 14.9  PLT 218 183 183     BNP Recent Labs  Lab 11/04/21  1244  BNP 669.9*      DDimer No results for input(s): "DDIMER" in the last 168 hours.   CHA2DS2-VASc Score = 7   This indicates a 11.2% annual risk of stroke. The patient's score is based upon: CHF History: 1 HTN History: 1 Diabetes History: 0 Stroke History: 2 Vascular Disease History: 0 Age Score: 2 Gender Score: 1      Radiology    ECHOCARDIOGRAM COMPLETE  Result Date: 11/04/2021    ECHOCARDIOGRAM REPORT   Patient Name:   Alison Dunn Date of Exam: 11/04/2021 Medical Rec #:  [id number]    Height:       65.0 in Accession #:    [id number]   Weight:       210.4 lb Date of Birth:  1931-03-09     BSA:          2.021 m Patient Age:    88 years     BP:           147/106 mmHg Patient Gender: F            HR:           115 bpm. Exam Location:  Inpatient Procedure: 2D Echo, Cardiac Doppler and Color Doppler Indications:    CHF  History:        Patient has no prior history of Echocardiogram examinations.                 Arrythmias:Atrial Fibrillation,  Signs/Symptoms:Shortness of                 Breath; Risk Factors:Hypertension.  Sonographer:    Greer Pickerel Sonographer#2:  Raquel Sarna Senior Referring Phys: [id number] AGBATA  Sonographer Comments: Technically difficult study due to poor echo windows. Image acquisition challenging due to respiratory motion and Image acquisition challenging due to patient body habitus. IMPRESSIONS  1. Left ventricular ejection fraction, by estimation, is 50 to 55%. The left ventricle has low normal function. The left ventricle has no regional wall motion abnormalities. Left ventricular diastolic function could not be evaluated.  2. Right ventricular systolic function is normal. The right ventricular size is mildly enlarged. There is normal pulmonary artery systolic pressure. The estimated right ventricular systolic pressure is 74.2 mmHg.  3. Left atrial size was moderately dilated.  4. Right atrial size was severely dilated.  5. The mitral valve is grossly normal. Moderate mitral valve regurgitation. No evidence of mitral stenosis.  6. Tricuspid valve regurgitation is mild to moderate.  7. The aortic valve is tricuspid. There is moderate calcification of the aortic valve. There is moderate thickening of the aortic valve. Aortic valve regurgitation is not visualized. Aortic valve sclerosis/calcification is present, without any evidence of aortic stenosis.  8. The inferior vena cava is normal in size with <50% respiratory variability, suggesting right atrial pressure of 8 mmHg. FINDINGS  Left Ventricle: Left ventricular ejection fraction, by estimation, is 50 to 55%. The left ventricle has low normal function. The left ventricle has no regional wall motion abnormalities. The left ventricular internal cavity size was normal in size. There is no left ventricular hypertrophy. Left ventricular diastolic function could not be evaluated due to atrial fibrillation. Left ventricular diastolic function could not be evaluated. Right Ventricle:  The right ventricular size is mildly enlarged. No increase in right ventricular wall thickness. Right ventricular systolic function is normal. There is normal pulmonary artery systolic pressure. The tricuspid regurgitant velocity is 2.34  m/s, and with an assumed right  atrial pressure of 8 mmHg, the estimated right ventricular systolic pressure is 62.9 mmHg. Left Atrium: Left atrial size was moderately dilated. Right Atrium: Right atrial size was severely dilated. Pericardium: There is no evidence of pericardial effusion. Presence of epicardial fat layer. Mitral Valve: The mitral valve is grossly normal. Moderate mitral valve regurgitation. No evidence of mitral valve stenosis. Tricuspid Valve: The tricuspid valve is grossly normal. Tricuspid valve regurgitation is mild to moderate. No evidence of tricuspid stenosis. Aortic Valve: The aortic valve is tricuspid. There is moderate calcification of the aortic valve. There is moderate thickening of the aortic valve. Aortic valve regurgitation is not visualized. Aortic valve sclerosis/calcification is present, without any  evidence of aortic stenosis. Pulmonic Valve: The pulmonic valve was grossly normal. Pulmonic valve regurgitation is not visualized. No evidence of pulmonic stenosis. Aorta: The aortic root and ascending aorta are structurally normal, with no evidence of dilitation. Venous: The inferior vena cava is normal in size with less than 50% respiratory variability, suggesting right atrial pressure of 8 mmHg. IAS/Shunts: The atrial septum is grossly normal.  LEFT VENTRICLE PLAX 2D LVIDd:         4.17 cm LVIDs:         3.92 cm LV PW:         0.90 cm LV IVS:        0.97 cm LVOT diam:     1.70 cm LV SV:         16 LV SV Index:   8 LVOT Area:     2.27 cm  LV Volumes (MOD) LV vol d, MOD A2C: 38.6 ml LV vol d, MOD A4C: 63.1 ml LV vol s, MOD A2C: 20.5 ml LV vol s, MOD A4C: 30.4 ml LV SV MOD A2C:     18.1 ml LV SV MOD A4C:     63.1 ml LV SV MOD BP:      24.3 ml RIGHT  VENTRICLE RV Basal diam:  3.90 cm TAPSE (M-mode): 0.8 cm LEFT ATRIUM             Index        RIGHT ATRIUM           Index LA diam:        4.40 cm 2.18 cm/m   RA Area:     33.10 cm LA Vol (A2C):   99.8 ml 49.38 ml/m  RA Volume:   115.00 ml 56.90 ml/m LA Vol (A4C):   90.5 ml 44.78 ml/m LA Biplane Vol: 99.0 ml 48.98 ml/m  AORTIC VALVE LVOT Vmax:   51.70 cm/s LVOT Vmean:  34.200 cm/s LVOT VTI:    0.070 m  AORTA Ao Root diam: 3.30 cm Ao Asc diam:  2.90 cm TRICUSPID VALVE TR Peak grad:   21.9 mmHg TR Vmax:        234.00 cm/s  SHUNTS Systemic VTI:  0.07 m Systemic Diam: 1.70 cm Eleonore Chiquito MD Electronically signed by Eleonore Chiquito MD Signature Date/Time: 11/04/2021/4:50:22 PM    Final    DG Chest 1 View  Result Date: 11/04/2021 CLINICAL DATA:  Shortness of breath weight gain and lower extremity edema. EXAM: CHEST  1 VIEW COMPARISON:  Chest CT 09/04/2014 FINDINGS: The cardiac silhouette, mediastinal and hilar contours are within normal limits for age and given the AP projection. Underlying emphysematous changes. No vascular congestion or pulmonary edema. There are small bilateral pleural effusions and bibasilar atelectasis. IMPRESSION: Small bilateral pleural effusions and bibasilar atelectasis. Electronically Signed  By: Marijo Sanes M.D.   On: 11/04/2021 13:10    Cardiac Studies   2D echo 11/04/2021 IMPRESSIONS    1. Left ventricular ejection fraction, by estimation, is 50 to 55%. The  left ventricle has low normal function. The left ventricle has no regional  wall motion abnormalities. Left ventricular diastolic function could not  be evaluated.   2. Right ventricular systolic function is normal. The right ventricular  size is mildly enlarged. There is normal pulmonary artery systolic  pressure. The estimated right ventricular systolic pressure is 86.5 mmHg.   3. Left atrial size was moderately dilated.   4. Right atrial size was severely dilated.   5. The mitral valve is grossly normal.  Moderate mitral valve  regurgitation. No evidence of mitral stenosis.   6. Tricuspid valve regurgitation is mild to moderate.   7. The aortic valve is tricuspid. There is moderate calcification of the  aortic valve. There is moderate thickening of the aortic valve. Aortic  valve regurgitation is not visualized. Aortic valve  sclerosis/calcification is present, without any evidence  of aortic stenosis.   8. The inferior vena cava is normal in size with <50% respiratory  variability, suggesting right atrial pressure of 8 mmHg.   Patient Profile     88 y.o. female  with a hx of HTN, [place] lung CA s/p lobectomy w/ no further rx needed, bilat PE 2011 & 2013 on coumadin, who is being seen 11/04/2021 for the evaluation of CHF and atrial fib at the request of Dr Francine Graven  Assessment & Plan    Atrial Fib with RVR -unknown duration, but likely several weeks -This patients CHA2DS2-VASc Score and unadjusted Ischemic Stroke Rate (% per year) is equal to 11.2 % stroke rate/year from a score of 7 Above score calculated as 1 point each if present [CHF, HTN, DM, Vascular=MI/PAD/Aortic Plaque, Age if 65-74, or Female] Above score calculated as 2 points each if present [Age > 75, or Stroke/TIA/TE]  -TSH normal at 2.1 -2D echo with low normal LVF EF 50-55% with moderate MR -at home on Cardizem and Propranolol which have been stopped -Stop IV Cardizem drip after cardioversion  -Increase Lopressor to 50 mg twice daily due to elevated heart rate as well as elevated blood pressure -continue warfarin>>INR 3 1 today -Plan for TEE/DCCV today -would consider transitioning from warfarin to Eliquis outpt given her advanced age to reduce risk of bleeding   2. Acute diastolic CHF -BNP is elevated, weight gain > 10lbs, marked LE edema -now on Lasix 40mg  IV BID -she put out 1 L yesterday with only net neg 484 cc since admit ? If accurate -SCr bumped from 1.35->>1.63 today -Weight is down 9 pounds from yesterday -LE  edema much improved -Stop IV Lasix and transition to p.o. Lasix 40mg  daily which is her home dose starting tomorrow -K+ 3.2>> replete to keep > 4 -Check Mag level   3. LLE Cellulitis - her L leg is always swollen, but it is worse now -She has discoloration, erythema and blisters as well -Antibiotics and management per IM, but she may benefit from an Unna boot or wound consult  4.  HTN -DBP remains elevated -Increasing Lopressor to 50 mg twice daily  Hopefully can go home later today after DCCV    I have spent a total of 30 minutes with patient reviewing 2D echo , telemetry, EKGs, labs and examining patient as well as establishing an assessment and plan that was discussed with the patient.  >  50% of time was spent in direct patient care.     For questions or updates, please contact [place] Please consult [email or web address] for contact info under        Signed, Fransico Him, MD  11/06/2021, 8:10 AM

## 2021-11-06 NOTE — Progress Notes (Signed)
Heart Failure Nurse Navigator Progress Note  PCP: Lajean Manes, MD PCP-Cardiologist: None Admission Diagnosis: Leg edema, Acute on chronic congestive heart failure.  Admitted from: Home  Presentation:   Alison Dunn presented with fatigue, lower extremity edema, shortness of breath, and a 10 pound weight gain .Newly diagnosed with atrial fibrillation on Cardizem. BP 138/77, HR 88, BNP elevated, IV lasix given , CXR showed small bilateral pleural effusions and bibasilar atelectasis. Patient went for a TEE/DCCV on 11/06/2021, where she was converted to NSR.   Patient was educated on the sign and symptoms of heart failure, daily weights, when to call her doctor oro go to the ER, Diet/fluid restrictions, taking all her medications as prescribed and attending all her medical appointments.Patient verbalized her understanding, a HF TOC appointment in scheduled for 11/17/2021 @ 11 am.    ECHO/ LVEF: 50-55%   Clinical Course:  Past Medical History:  Diagnosis Date   Hypertension    Non-small cell lung cancer (NSCLC) ([place]) 2006   s/p surgery, no chemo/XRT needed     Social History   Socioeconomic History   Marital status: Widowed    Spouse name: Not on file   Number of children: 2   Years of education: Not on file   Highest education level: High school graduate  Occupational History   Occupation: retired  Tobacco Use   Smoking status: Never   Smokeless tobacco: Never  Vaping Use   Vaping Use: Never used  Substance and Sexual Activity   Alcohol use: No   Drug use: Never   Sexual activity: Not on file  Other Topics Concern   Not on file  Social History Narrative   Not on file   Social Determinants of Health   Financial Resource Strain: Low Risk  (11/06/2021)   Overall Financial Resource Strain (CARDIA)    Difficulty of Paying Living Expenses: Not hard at all  Food Insecurity: No Food Insecurity (11/05/2021)   Hunger Vital Sign    Worried About Running Out of Food in the Last  Year: Never true    [place] in the Last Year: Never true  Transportation Needs: No Transportation Needs (11/06/2021)   PRAPARE - Hydrologist (Medical): No    Lack of Transportation (Non-Medical): No  Physical Activity: Not on file  Stress: Not on file  Social Connections: Not on file     High Risk Criteria for Readmission and/or Poor Patient Outcomes: Heart failure hospital admissions (last 6 months): 0  No Show rate: 0 Difficult social situation: No Demonstrates medication adherence: Yes Primary Language: English Literacy level: Reading, writing, and comprehension  Barriers of Care:   Diet/ fluid restrictions Daily weights  Considerations/Referrals:   Referral made to Heart Failure Pharmacist Stewardship: Yes Referral made to Heart Failure CSW/NCM TOC: No Referral made to Heart & Vascular TOC clinic: Yes, 11/17/2021 @ 11 am.   Items for Follow-up on DC/TOC: Daily weights Diet/ fluid restrictions   Earnestine Leys, BSN, RN Heart Failure Transport planner Only

## 2021-11-06 NOTE — Progress Notes (Signed)
PT is NPO

## 2021-11-06 NOTE — Progress Notes (Signed)
PROGRESS NOTE    Alison Dunn  IWL:[id number] DOB: 22-Mar-1931 DOA: 11/04/2021 PCP: Alison Manes, MD  88/F with history of non-small cell lung cancer status post lobectomy, hypertension, bilateral PE in 2011 and 2013 on Coumadin, presented to the ED with progressive dyspnea on exertion, noted to be in A-fib RVR with fluid overload   Subjective: -Breathing improving  Assessment and Plan:  Acute on chronic diastolic CHF Most likely related to rapid A-fib -Diuresed with IV Lasix, I's/O's are inaccurate but weight down 6-9 Lbs -Creatinine bumped to 1.6, agree with holding additional IV Lasix today, restart oral Lasix from tomorrow -Echo noted EF of 50-55% with no regional wall motion abnormalities, diastolic parameters could not be evaluated -Add Aldactone tomorrow if kidney function improves  A-fib with RVR -Heart rate suboptimally controlled, echo with preserved EF -Remains on Cardizem gtt., home dose of propranolol discontinued -Continue Lopressor, dose increased, remains on Coumadin, INR therapeutic, plan for TEE/DCCV today  Left leg skin rash -present for >5years and unchanged, followed by Derm Alison Dunn -Do not suspect infection  Hypertension -Continue diuretics as above, metoprolol  Obesity (BMI 30-39.9) BMI 35 Complicates overall prognosis and care  Non-small cell lung cancer ([place]) Status post left upper lobectomy Stable  DVT prophylaxis: Warfarin Code Status: Full code Family Communication: Discussed with patient detail, no family at bedside Disposition Plan: Home likely tomorrow Consultants: Cards   Procedures:   Antimicrobials:    Objective: Vitals:   11/05/21 1933 11/06/21 0032 11/06/21 0514 11/06/21 0904  BP: (!) 152/101 (!) 132/92 130/75 (!) 131/101  Pulse: 99 (!) 127 (!) 127 (!) 35  Resp: 19 16 19  (!) 21  Temp: (!) 97.4 F (36.3 C) (!) 97.4 F (36.3 C) (!) 97.4 F (36.3 C) (!) 97.4 F (36.3 C)  TempSrc: Oral Oral Oral Oral  SpO2: 96% 97%  94%   Weight:   90.7 kg   Height:        Intake/Output Summary (Last 24 hours) at 11/06/2021 1130 Last data filed at 11/06/2021 1740 Gross per 24 hour  Intake 466.87 ml  Output 1050 ml  Net -583.13 ml   Filed Weights   11/04/21 1801 11/05/21 0430 11/06/21 0514  Weight: 93.8 kg 95 kg 90.7 kg    Examination:  General exam: Obese pleasant female sitting up in bed, AAOx3, no distress HEENT: No JVD CVS: S1-S2, irregularly irregular rhythm Lungs: Decreased breath sounds bases Abdomen: Soft, nontender, bowel sounds present Extremities: Trace edema Skin: L leg with areas of discoloration, scaling Psychiatry:  Mood & affect appropriate.     Data Reviewed:   CBC: Recent Labs  Lab 11/04/21 1244 11/05/21 0508 11/06/21 0328  WBC 8.2 6.9 6.6  NEUTROABS 5.4  --   --   HGB 14.3 14.8 13.9  HCT 45.5 45.7 42.7  MCV 100.7* 97.4 98.2  PLT 218 183 814   Basic Metabolic Panel: Recent Labs  Lab 11/04/21 1244 11/05/21 0508 11/06/21 0328  NA 140 140 137  K 3.8 3.4* 3.2*  CL 103 102 98  CO2 27 27 27   GLUCOSE 134* 137* 133*  BUN 27* 24* 27*  CREATININE 1.42* 1.35* 1.63*  CALCIUM 9.1 8.9 8.8*   GFR: Estimated Creatinine Clearance: 26.6 mL/min (A) (by C-G formula based on SCr of 1.63 mg/dL (H)). Liver Function Tests: No results for input(s): "AST", "ALT", "ALKPHOS", "BILITOT", "PROT", "ALBUMIN" in the last 168 hours. No results for input(s): "LIPASE", "AMYLASE" in the last 168 hours. No results for input(s): "AMMONIA" in the  last 168 hours. Coagulation Profile: Recent Labs  Lab 11/04/21 1244 11/05/21 0508 11/06/21 0328  INR 3.3* 3.2* 3.1*   Cardiac Enzymes: No results for input(s): "CKTOTAL", "CKMB", "CKMBINDEX", "TROPONINI" in the last 168 hours. BNP (last 3 results) No results for input(s): "PROBNP" in the last 8760 hours. HbA1C: No results for input(s): "HGBA1C" in the last 72 hours. CBG: No results for input(s): "GLUCAP" in the last 168 hours. Lipid Profile: No  results for input(s): "CHOL", "HDL", "LDLCALC", "TRIG", "CHOLHDL", "LDLDIRECT" in the last 72 hours. Thyroid Function Tests: Recent Labs    11/05/21 0513  TSH 2.101   Anemia Panel: No results for input(s): "VITAMINB12", "FOLATE", "FERRITIN", "TIBC", "IRON", "RETICCTPCT" in the last 72 hours. Urine analysis:    Component Value Date/Time   COLORURINE YELLOW 06/21/2009 0855   APPEARANCEUR CLOUDY (A) 06/21/2009 0855   LABSPEC 1.022 06/21/2009 0855   PHURINE 6.5 06/21/2009 0855   GLUCOSEU NEGATIVE 06/21/2009 0855   HGBUR NEGATIVE 06/21/2009 0855   BILIRUBINUR NEGATIVE 06/21/2009 0855   KETONESUR NEGATIVE 06/21/2009 0855   PROTEINUR NEGATIVE 06/21/2009 0855   UROBILINOGEN 0.2 06/21/2009 0855   NITRITE NEGATIVE 06/21/2009 0855   LEUKOCYTESUR SMALL (A) 06/21/2009 0855   Sepsis Labs: @LABRCNTIP (procalcitonin:4,lacticidven:4)  )No results found for this or any previous visit (from the past 240 hour(s)).   Radiology Studies: ECHOCARDIOGRAM COMPLETE  Result Date: 11/04/2021    ECHOCARDIOGRAM REPORT   Patient Name:   Alison Dunn Date of Exam: 11/04/2021 Medical Rec #:  [id number]    Height:       65.0 in Accession #:    [id number]   Weight:       210.4 lb Date of Birth:  1931-06-24     BSA:          2.021 m Patient Age:    88 years     BP:           147/106 mmHg Patient Gender: F            HR:           115 bpm. Exam Location:  Inpatient Procedure: 2D Echo, Cardiac Doppler and Color Doppler Indications:    CHF  History:        Patient has no prior history of Echocardiogram examinations.                 Arrythmias:Atrial Fibrillation, Signs/Symptoms:Shortness of                 Breath; Risk Factors:Hypertension.  Sonographer:    Greer Pickerel Sonographer#2:  Raquel Sarna Senior Referring Phys: [id number]  Sonographer Comments: Technically difficult study due to poor echo windows. Image acquisition challenging due to respiratory motion and Image acquisition challenging due to patient body  habitus. IMPRESSIONS  1. Left ventricular ejection fraction, by estimation, is 50 to 55%. The left ventricle has low normal function. The left ventricle has no regional wall motion abnormalities. Left ventricular diastolic function could not be evaluated.  2. Right ventricular systolic function is normal. The right ventricular size is mildly enlarged. There is normal pulmonary artery systolic pressure. The estimated right ventricular systolic pressure is 66.4 mmHg.  3. Left atrial size was moderately dilated.  4. Right atrial size was severely dilated.  5. The mitral valve is grossly normal. Moderate mitral valve regurgitation. No evidence of mitral stenosis.  6. Tricuspid valve regurgitation is mild to moderate.  7. The aortic valve is tricuspid. There is moderate calcification of the aortic  valve. There is moderate thickening of the aortic valve. Aortic valve regurgitation is not visualized. Aortic valve sclerosis/calcification is present, without any evidence of aortic stenosis.  8. The inferior vena cava is normal in size with <50% respiratory variability, suggesting right atrial pressure of 8 mmHg. FINDINGS  Left Ventricle: Left ventricular ejection fraction, by estimation, is 50 to 55%. The left ventricle has low normal function. The left ventricle has no regional wall motion abnormalities. The left ventricular internal cavity size was normal in size. There is no left ventricular hypertrophy. Left ventricular diastolic function could not be evaluated due to atrial fibrillation. Left ventricular diastolic function could not be evaluated. Right Ventricle: The right ventricular size is mildly enlarged. No increase in right ventricular wall thickness. Right ventricular systolic function is normal. There is normal pulmonary artery systolic pressure. The tricuspid regurgitant velocity is 2.34  m/s, and with an assumed right atrial pressure of 8 mmHg, the estimated right ventricular systolic pressure is 86.7 mmHg.  Left Atrium: Left atrial size was moderately dilated. Right Atrium: Right atrial size was severely dilated. Pericardium: There is no evidence of pericardial effusion. Presence of epicardial fat layer. Mitral Valve: The mitral valve is grossly normal. Moderate mitral valve regurgitation. No evidence of mitral valve stenosis. Tricuspid Valve: The tricuspid valve is grossly normal. Tricuspid valve regurgitation is mild to moderate. No evidence of tricuspid stenosis. Aortic Valve: The aortic valve is tricuspid. There is moderate calcification of the aortic valve. There is moderate thickening of the aortic valve. Aortic valve regurgitation is not visualized. Aortic valve sclerosis/calcification is present, without any  evidence of aortic stenosis. Pulmonic Valve: The pulmonic valve was grossly normal. Pulmonic valve regurgitation is not visualized. No evidence of pulmonic stenosis. Aorta: The aortic root and ascending aorta are structurally normal, with no evidence of dilitation. Venous: The inferior vena cava is normal in size with less than 50% respiratory variability, suggesting right atrial pressure of 8 mmHg. IAS/Shunts: The atrial septum is grossly normal.  LEFT VENTRICLE PLAX 2D LVIDd:         4.17 cm LVIDs:         3.92 cm LV PW:         0.90 cm LV IVS:        0.97 cm LVOT diam:     1.70 cm LV SV:         16 LV SV Index:   8 LVOT Area:     2.27 cm  LV Volumes (MOD) LV vol d, MOD A2C: 38.6 ml LV vol d, MOD A4C: 63.1 ml LV vol s, MOD A2C: 20.5 ml LV vol s, MOD A4C: 30.4 ml LV SV MOD A2C:     18.1 ml LV SV MOD A4C:     63.1 ml LV SV MOD BP:      24.3 ml RIGHT VENTRICLE RV Basal diam:  3.90 cm TAPSE (M-mode): 0.8 cm LEFT ATRIUM             Index        RIGHT ATRIUM           Index LA diam:        4.40 cm 2.18 cm/m   RA Area:     33.10 cm LA Vol (A2C):   99.8 ml 49.38 ml/m  RA Volume:   115.00 ml 56.90 ml/m LA Vol (A4C):   90.5 ml 44.78 ml/m LA Biplane Vol: 99.0 ml 48.98 ml/m  AORTIC VALVE LVOT Vmax:   51.70  cm/s LVOT Vmean:  34.200 cm/s LVOT VTI:    0.070 m  AORTA Ao Root diam: 3.30 cm Ao Asc diam:  2.90 cm TRICUSPID VALVE TR Peak grad:   21.9 mmHg TR Vmax:        234.00 cm/s  SHUNTS Systemic VTI:  0.07 m Systemic Diam: 1.70 cm Eleonore Chiquito MD Electronically signed by Eleonore Chiquito MD Signature Date/Time: 11/04/2021/4:50:22 PM    Final    DG Chest 1 View  Result Date: 11/04/2021 CLINICAL DATA:  Shortness of breath weight gain and lower extremity edema. EXAM: CHEST  1 VIEW COMPARISON:  Chest CT 09/04/2014 FINDINGS: The cardiac silhouette, mediastinal and hilar contours are within normal limits for age and given the AP projection. Underlying emphysematous changes. No vascular congestion or pulmonary edema. There are small bilateral pleural effusions and bibasilar atelectasis. IMPRESSION: Small bilateral pleural effusions and bibasilar atelectasis. Electronically Signed   By: Marijo Sanes M.D.   On: 11/04/2021 13:10     Scheduled Meds:  acetaminophen  1,000 mg Oral BID   ascorbic acid  500 mg Oral Daily   calcium carbonate  1,250 mg Oral Q breakfast   cholecalciferol  1,000 Units Oral Daily   ferrous sulfate  325 mg Oral Q breakfast   [START ON 11/07/2021] furosemide  40 mg Oral Daily   metoprolol tartrate  50 mg Oral BID   multivitamin with minerals  1 tablet Oral Daily   pantoprazole  40 mg Oral Daily   potassium chloride  40 mEq Oral TID   psyllium  1 packet Oral Daily   warfarin  3 mg Oral ONCE-1600   Warfarin - Pharmacist Dosing Inpatient   Does not apply q1600   Continuous Infusions:  diltiazem (CARDIZEM) infusion 15 mg/hr (11/06/21 0112)     LOS: 2 days    Time spent: 91min    Domenic Polite, MD [facility]   11/06/2021, 11:30 AM

## 2021-11-06 NOTE — Anesthesia Postprocedure Evaluation (Signed)
Anesthesia Post Note  Patient: Alison Dunn  Procedure(s) Performed: TRANSESOPHAGEAL ECHOCARDIOGRAM (TEE) CARDIOVERSION     Patient location during evaluation: Endoscopy Anesthesia Type: MAC Level of consciousness: awake Pain management: pain level controlled Vital Signs Assessment: post-procedure vital signs reviewed and stable Respiratory status: spontaneous breathing, nonlabored ventilation, respiratory function stable and patient connected to nasal cannula oxygen Cardiovascular status: stable and blood pressure returned to baseline Postop Assessment: no apparent nausea or vomiting Anesthetic complications: no   No notable events documented.  Last Vitals:  Vitals:   11/06/21 1500 11/06/21 1510  BP: 118/77 108/88  Pulse: (!) 51 (!) 57  Resp: 19 18  Temp:    SpO2: 92% 91%    Last Pain:  Vitals:   11/06/21 1510  TempSrc:   PainSc: 0-No pain                 Marcee Jacobs P Tery Hoeger

## 2021-11-06 NOTE — Progress Notes (Signed)
ANTICOAGULATION CONSULT NOTE - Follow Up Consult  Pharmacy Consult for warfarin Indication: atrial fibrillation and PMH pulmonary embolus  Allergies  Allergen Reactions   Codeine Nausea And Vomiting   Vicodin [Hydrocodone-Acetaminophen] Other (See Comments)    Doesn't like how it makes her feel    Patient Measurements: Height: 5\' 5"  (165.1 cm) Weight: 90.7 kg (200 lb) IBW/kg (Calculated) : 57  Vital Signs: Temp: 97.4 F (36.3 C) (09/14 0514) Temp Source: Oral (09/14 0514) BP: 130/75 (09/14 0514) Pulse Rate: 127 (09/14 0514)  Labs: Recent Labs    11/04/21 1244 11/05/21 0508 11/06/21 0328  HGB 14.3 14.8 13.9  HCT 45.5 45.7 42.7  PLT 218 183 183  LABPROT 33.0* 32.3* 31.9*  INR 3.3* 3.2* 3.1*  CREATININE 1.42* 1.35* 1.63*     Estimated Creatinine Clearance: 26.6 mL/min (A) (by C-G formula based on SCr of 1.63 mg/dL (H)).   Medications:  Scheduled:   acetaminophen  1,000 mg Oral BID   ascorbic acid  500 mg Oral Daily   calcium carbonate  1,250 mg Oral Q breakfast   cholecalciferol  1,000 Units Oral Daily   ferrous sulfate  325 mg Oral Q breakfast   furosemide  40 mg Intravenous Q8H   metoprolol tartrate  25 mg Oral BID   multivitamin with minerals  1 tablet Oral Daily   pantoprazole  40 mg Oral Daily   potassium chloride  40 mEq Oral TID   psyllium  1 packet Oral Daily   Warfarin - Pharmacist Dosing Inpatient   Does not apply q1600   Infusions:   diltiazem (CARDIZEM) infusion 15 mg/hr (11/06/21 0112)    Assessment: 88 YO F with PMH afib and bilateral PE in 2011 and 2013. Pt is on warfarin last dose 11/03/21 @4pm  PTA. Pharmacy consulted to dose warfarin while inpatient.   Warfarin regimen PTA: 3mg  daily CBC stable   9/12 INR 3.3 9/13 INR 3.2  9/14 INR 3.1   Goal of Therapy:  INR 2-3 Monitor platelets by anticoagulation protocol: Yes   Plan:  Warfarin 3mg  by mouth x1 today F/u s/sx bleeding, CBC Daily INR    Wilson Singer,  PharmD Clinical Pharmacist 11/06/2021 7:40 AM

## 2021-11-06 NOTE — Transfer of Care (Signed)
Immediate Anesthesia Transfer of Care Note  Patient: Alison Dunn  Procedure(s) Performed: TRANSESOPHAGEAL ECHOCARDIOGRAM (TEE) CARDIOVERSION  Patient Location: PACU  Anesthesia Type:MAC  Level of Consciousness: drowsy  Airway & Oxygen Therapy: Patient Spontanous Breathing and Patient connected to nasal cannula oxygen  Post-op Assessment: Report given to RN and Post -op Vital signs reviewed and stable  Post vital signs: Reviewed and stable  Last Vitals:  Vitals Value Taken Time  BP 108/60 11/06/21 1450  Temp    Pulse 56 11/06/21 1452  Resp 20 11/06/21 1452  SpO2 92 % 11/06/21 1452  Vitals shown include unvalidated device data.  Last Pain:  Vitals:   11/06/21 1356  TempSrc: Temporal  PainSc: 0-No pain         Complications: No notable events documented.

## 2021-11-06 NOTE — Progress Notes (Signed)
   11/06/21 1700  Mobility  Activity Ambulated with assistance to bathroom  Level of Assistance Contact guard assist, steadying assist  Assistive Device Front wheel walker  Distance Ambulated (ft) 10 ft  Activity Response Tolerated well  $Mobility charge 1 Mobility   Received pt in bed having no complaints and agreeable to mobility. Pt was asymptomatic throughout ambulation and returned to room w/o fault. Left in bed w/ call bell in reach and all needs met.

## 2021-11-06 NOTE — Progress Notes (Signed)
   11/06/21 0904  Assess: MEWS Score  Temp (!) 97.4 F (36.3 C)  BP (!) 131/101  MAP (mmHg) 111  Pulse Rate (!) 35  ECG Heart Rate (!) 113  Resp (!) 21  Level of Consciousness Alert  SpO2 94 %  O2 Device Room Air  Assess: MEWS Score  MEWS Temp 0  MEWS Systolic 0  MEWS Pulse 2  MEWS RR 1  MEWS LOC 0  MEWS Score 3  MEWS Score Color Yellow  Assess: if the MEWS score is Yellow or Red  Were vital signs taken at a resting state? Yes  Focused Assessment Change from prior assessment (see assessment flowsheet)  Does the patient meet 2 or more of the SIRS criteria? No  MEWS guidelines implemented *See Row Information* No, previously yellow, continue vital signs every 4 hours  Treat  MEWS Interventions Administered scheduled meds/treatments  Pain Scale 0-10  Pain Score 0  Early Detection of Sepsis Score *See Row Information* Low  Notify: Charge Nurse/RN  Name of Charge Nurse/RN Notified Bonney Roussel  Date Charge Nurse/RN Notified 11/06/21  Time Charge Nurse/RN Notified [phone]  Document  Patient Outcome Not stable and remains on department  Progress note created (see row info) Yes  Assess: SIRS CRITERIA  SIRS Temperature  0  SIRS Pulse 1  SIRS Respirations  1  SIRS WBC 1  SIRS Score Sum  3   Gave Scheduled Beta Blocker and New orders for IV Lasix

## 2021-11-06 NOTE — Progress Notes (Deleted)
11/06/21 1200  Mobility  Activity Ambulated with assistance in hallway  Level of Assistance Modified independent, requires aide device or extra time  Assistive Device Front wheel walker  Distance Ambulated (ft) 125 ft  Activity Response Tolerated well  $Mobility charge 1 Mobility   Mobility Specialist Progress Note  Received pt in chair having no complaints and agreeable to mobility. Pt was asymptomatic throughout ambulation and returned to room w/o fault. Left in chair w/ call bell in reach and all needs met.   Lucious Groves Mobility Specialist

## 2021-11-06 NOTE — H&P (View-Only) (Signed)
Progress Note  Patient Name: Alison Dunn Date of Encounter: 11/06/2021  [facility] Cardiologist: None   Subjective  Remains in atrial fibrillation with RVR in the 120s.  Inpatient Medications    Scheduled Meds:  acetaminophen  1,000 mg Oral BID   ascorbic acid  500 mg Oral Daily   calcium carbonate  1,250 mg Oral Q breakfast   cholecalciferol  1,000 Units Oral Daily   ferrous sulfate  325 mg Oral Q breakfast   furosemide  40 mg Intravenous Q8H   metoprolol tartrate  25 mg Oral BID   multivitamin with minerals  1 tablet Oral Daily   pantoprazole  40 mg Oral Daily   potassium chloride  40 mEq Oral TID   psyllium  1 packet Oral Daily   warfarin  3 mg Oral ONCE-1600   Warfarin - Pharmacist Dosing Inpatient   Does not apply q1600   Continuous Infusions:  diltiazem (CARDIZEM) infusion 15 mg/hr (11/06/21 0112)   PRN Meds: LORazepam, ondansetron **OR** ondansetron (ZOFRAN) IV   Vital Signs    Vitals:   11/05/21 1444 11/05/21 1933 11/06/21 0032 11/06/21 0514  BP:  (!) 152/101 (!) 132/92 130/75  Pulse: 86 99 (!) 127 (!) 127  Resp:  19 16 19   Temp:  (!) 97.4 F (36.3 C) (!) 97.4 F (36.3 C) (!) 97.4 F (36.3 C)  TempSrc:  Oral Oral Oral  SpO2:  96% 97%   Weight:    90.7 kg  Height:        Intake/Output Summary (Last 24 hours) at 11/06/2021 0810 Last data filed at 11/06/2021 0516 Gross per 24 hour  Intake 820.55 ml  Output 1000 ml  Net -179.45 ml       11/06/2021    5:14 AM 11/05/2021    4:30 AM 11/04/2021    6:01 PM  Last 3 Weights  Weight (lbs) 200 lb 209 lb 8 oz 206 lb 12.7 oz  Weight (kg) 90.719 kg 95.029 kg 93.8 kg      Telemetry    Atrial fibrillation with RVR- Personally Reviewed  ECG    No new EKG to review - Personally Reviewed  Physical Exam   GEN: Well nourished, well developed in no acute distress HEENT: Normal NECK: No JVD; No carotid bruits LYMPHATICS: No lymphadenopathy CARDIAC:irregularly irregular and tachy, no murmurs, rubs,  gallops RESPIRATORY:  Clear to auscultation without rales, wheezing or rhonchi  ABDOMEN: Soft, non-tender, non-distended MUSCULOSKELETAL:  1+ LE edema; No deformity  SKIN: Warm and dry NEUROLOGIC:  Alert and oriented x 3 PSYCHIATRIC:  Normal affect   Labs    High Sensitivity Troponin:  No results for input(s): "TROPONINIHS" in the last 720 hours.    Chemistry Recent Labs  Lab 11/04/21 1244 11/05/21 0508 11/06/21 0328  NA 140 140 137  K 3.8 3.4* 3.2*  CL 103 102 98  CO2 27 27 27   GLUCOSE 134* 137* 133*  BUN 27* 24* 27*  CREATININE 1.42* 1.35* 1.63*  CALCIUM 9.1 8.9 8.8*  GFRNONAA 36* 38* 30*  ANIONGAP 10 11 12       Hematology Recent Labs  Lab 11/04/21 1244 11/05/21 0508 11/06/21 0328  WBC 8.2 6.9 6.6  RBC 4.52 4.69 4.35  HGB 14.3 14.8 13.9  HCT 45.5 45.7 42.7  MCV 100.7* 97.4 98.2  MCH 31.6 31.6 32.0  MCHC 31.4 32.4 32.6  RDW 15.0 15.0 14.9  PLT 218 183 183     BNP Recent Labs  Lab 11/04/21  1244  BNP 669.9*      DDimer No results for input(s): "DDIMER" in the last 168 hours.   CHA2DS2-VASc Score = 7   This indicates a 11.2% annual risk of stroke. The patient's score is based upon: CHF History: 1 HTN History: 1 Diabetes History: 0 Stroke History: 2 Vascular Disease History: 0 Age Score: 2 Gender Score: 1      Radiology    ECHOCARDIOGRAM COMPLETE  Result Date: 11/04/2021    ECHOCARDIOGRAM REPORT   Patient Name:   Alison Dunn Date of Exam: 11/04/2021 Medical Rec #:  [id number]    Height:       65.0 in Accession #:    [id number]   Weight:       210.4 lb Date of Birth:  1932-01-09     BSA:          2.021 m Patient Age:    88 years     BP:           147/106 mmHg Patient Gender: F            HR:           115 bpm. Exam Location:  Inpatient Procedure: 2D Echo, Cardiac Doppler and Color Doppler Indications:    CHF  History:        Patient has no prior history of Echocardiogram examinations.                 Arrythmias:Atrial Fibrillation,  Signs/Symptoms:Shortness of                 Breath; Risk Factors:Hypertension.  Sonographer:    Greer Pickerel Sonographer#2:  Raquel Sarna Senior Referring Phys: [id number]  Sonographer Comments: Technically difficult study due to poor echo windows. Image acquisition challenging due to respiratory motion and Image acquisition challenging due to patient body habitus. IMPRESSIONS  1. Left ventricular ejection fraction, by estimation, is 50 to 55%. The left ventricle has low normal function. The left ventricle has no regional wall motion abnormalities. Left ventricular diastolic function could not be evaluated.  2. Right ventricular systolic function is normal. The right ventricular size is mildly enlarged. There is normal pulmonary artery systolic pressure. The estimated right ventricular systolic pressure is 32.6 mmHg.  3. Left atrial size was moderately dilated.  4. Right atrial size was severely dilated.  5. The mitral valve is grossly normal. Moderate mitral valve regurgitation. No evidence of mitral stenosis.  6. Tricuspid valve regurgitation is mild to moderate.  7. The aortic valve is tricuspid. There is moderate calcification of the aortic valve. There is moderate thickening of the aortic valve. Aortic valve regurgitation is not visualized. Aortic valve sclerosis/calcification is present, without any evidence of aortic stenosis.  8. The inferior vena cava is normal in size with <50% respiratory variability, suggesting right atrial pressure of 8 mmHg. FINDINGS  Left Ventricle: Left ventricular ejection fraction, by estimation, is 50 to 55%. The left ventricle has low normal function. The left ventricle has no regional wall motion abnormalities. The left ventricular internal cavity size was normal in size. There is no left ventricular hypertrophy. Left ventricular diastolic function could not be evaluated due to atrial fibrillation. Left ventricular diastolic function could not be evaluated. Right Ventricle:  The right ventricular size is mildly enlarged. No increase in right ventricular wall thickness. Right ventricular systolic function is normal. There is normal pulmonary artery systolic pressure. The tricuspid regurgitant velocity is 2.34  m/s, and with an assumed right  atrial pressure of 8 mmHg, the estimated right ventricular systolic pressure is 35.5 mmHg. Left Atrium: Left atrial size was moderately dilated. Right Atrium: Right atrial size was severely dilated. Pericardium: There is no evidence of pericardial effusion. Presence of epicardial fat layer. Mitral Valve: The mitral valve is grossly normal. Moderate mitral valve regurgitation. No evidence of mitral valve stenosis. Tricuspid Valve: The tricuspid valve is grossly normal. Tricuspid valve regurgitation is mild to moderate. No evidence of tricuspid stenosis. Aortic Valve: The aortic valve is tricuspid. There is moderate calcification of the aortic valve. There is moderate thickening of the aortic valve. Aortic valve regurgitation is not visualized. Aortic valve sclerosis/calcification is present, without any  evidence of aortic stenosis. Pulmonic Valve: The pulmonic valve was grossly normal. Pulmonic valve regurgitation is not visualized. No evidence of pulmonic stenosis. Aorta: The aortic root and ascending aorta are structurally normal, with no evidence of dilitation. Venous: The inferior vena cava is normal in size with less than 50% respiratory variability, suggesting right atrial pressure of 8 mmHg. IAS/Shunts: The atrial septum is grossly normal.  LEFT VENTRICLE PLAX 2D LVIDd:         4.17 cm LVIDs:         3.92 cm LV PW:         0.90 cm LV IVS:        0.97 cm LVOT diam:     1.70 cm LV SV:         16 LV SV Index:   8 LVOT Area:     2.27 cm  LV Volumes (MOD) LV vol d, MOD A2C: 38.6 ml LV vol d, MOD A4C: 63.1 ml LV vol s, MOD A2C: 20.5 ml LV vol s, MOD A4C: 30.4 ml LV SV MOD A2C:     18.1 ml LV SV MOD A4C:     63.1 ml LV SV MOD BP:      24.3 ml RIGHT  VENTRICLE RV Basal diam:  3.90 cm TAPSE (M-mode): 0.8 cm LEFT ATRIUM             Index        RIGHT ATRIUM           Index LA diam:        4.40 cm 2.18 cm/m   RA Area:     33.10 cm LA Vol (A2C):   99.8 ml 49.38 ml/m  RA Volume:   115.00 ml 56.90 ml/m LA Vol (A4C):   90.5 ml 44.78 ml/m LA Biplane Vol: 99.0 ml 48.98 ml/m  AORTIC VALVE LVOT Vmax:   51.70 cm/s LVOT Vmean:  34.200 cm/s LVOT VTI:    0.070 m  AORTA Ao Root diam: 3.30 cm Ao Asc diam:  2.90 cm TRICUSPID VALVE TR Peak grad:   21.9 mmHg TR Vmax:        234.00 cm/s  SHUNTS Systemic VTI:  0.07 m Systemic Diam: 1.70 cm Eleonore Chiquito MD Electronically signed by Eleonore Chiquito MD Signature Date/Time: 11/04/2021/4:50:22 PM    Final    DG Chest 1 View  Result Date: 11/04/2021 CLINICAL DATA:  Shortness of breath weight gain and lower extremity edema. EXAM: CHEST  1 VIEW COMPARISON:  Chest CT 09/04/2014 FINDINGS: The cardiac silhouette, mediastinal and hilar contours are within normal limits for age and given the AP projection. Underlying emphysematous changes. No vascular congestion or pulmonary edema. There are small bilateral pleural effusions and bibasilar atelectasis. IMPRESSION: Small bilateral pleural effusions and bibasilar atelectasis. Electronically Signed  By: Marijo Sanes M.D.   On: 11/04/2021 13:10    Cardiac Studies   2D echo 11/04/2021 IMPRESSIONS    1. Left ventricular ejection fraction, by estimation, is 50 to 55%. The  left ventricle has low normal function. The left ventricle has no regional  wall motion abnormalities. Left ventricular diastolic function could not  be evaluated.   2. Right ventricular systolic function is normal. The right ventricular  size is mildly enlarged. There is normal pulmonary artery systolic  pressure. The estimated right ventricular systolic pressure is 31.4 mmHg.   3. Left atrial size was moderately dilated.   4. Right atrial size was severely dilated.   5. The mitral valve is grossly normal.  Moderate mitral valve  regurgitation. No evidence of mitral stenosis.   6. Tricuspid valve regurgitation is mild to moderate.   7. The aortic valve is tricuspid. There is moderate calcification of the  aortic valve. There is moderate thickening of the aortic valve. Aortic  valve regurgitation is not visualized. Aortic valve  sclerosis/calcification is present, without any evidence  of aortic stenosis.   8. The inferior vena cava is normal in size with <50% respiratory  variability, suggesting right atrial pressure of 8 mmHg.   Patient Profile     88 y.o. female  with a hx of HTN, [place] lung CA s/p lobectomy w/ no further rx needed, bilat PE 2011 & 2013 on coumadin, who is being seen 11/04/2021 for the evaluation of CHF and atrial fib at the request of Dr Francine Graven  Assessment & Plan    Atrial Fib with RVR -unknown duration, but likely several weeks -This patients CHA2DS2-VASc Score and unadjusted Ischemic Stroke Rate (% per year) is equal to 11.2 % stroke rate/year from a score of 7 Above score calculated as 1 point each if present [CHF, HTN, DM, Vascular=MI/PAD/Aortic Plaque, Age if 65-74, or Female] Above score calculated as 2 points each if present [Age > 75, or Stroke/TIA/TE]  -TSH normal at 2.1 -2D echo with low normal LVF EF 50-55% with moderate MR -at home on Cardizem and Propranolol which have been stopped -Stop IV Cardizem drip after cardioversion  -Increase Lopressor to 50 mg twice daily due to elevated heart rate as well as elevated blood pressure -continue warfarin>>INR 3 1 today -Plan for TEE/DCCV today -would consider transitioning from warfarin to Eliquis outpt given her advanced age to reduce risk of bleeding   2. Acute diastolic CHF -BNP is elevated, weight gain > 10lbs, marked LE edema -now on Lasix 40mg  IV BID -she put out 1 L yesterday with only net neg 484 cc since admit ? If accurate -SCr bumped from 1.35->>1.63 today -Weight is down 9 pounds from yesterday -LE  edema much improved -Stop IV Lasix and transition to p.o. Lasix 40mg  daily which is her home dose starting tomorrow -K+ 3.2>> replete to keep > 4 -Check Mag level   3. LLE Cellulitis - her L leg is always swollen, but it is worse now -She has discoloration, erythema and blisters as well -Antibiotics and management per IM, but she may benefit from an Unna boot or wound consult  4.  HTN -DBP remains elevated -Increasing Lopressor to 50 mg twice daily  Hopefully can go home later today after DCCV    I have spent a total of 30 minutes with patient reviewing 2D echo , telemetry, EKGs, labs and examining patient as well as establishing an assessment and plan that was discussed with the patient.  >  50% of time was spent in direct patient care.     For questions or updates, please contact [place] Please consult [email or web address] for contact info under        Signed, Fransico Him, MD  11/06/2021, 8:10 AM

## 2021-11-06 NOTE — Progress Notes (Signed)
  Echocardiogram Echocardiogram Transesophageal has been performed.  Alison Dunn M 11/06/2021, 3:01 PM

## 2021-11-06 NOTE — Progress Notes (Signed)
S/P successful TEE/DCCV.    TEE showed biatrial enlargement with moderate PFO, moderate MR and TR and no LAA thrombus and low normal LVF EF 50-55%.  Will stop IV Cardizem gtt. Continue Warfarin and consider eventual transition to Eliquis outpt for lower bleeding risk given advanced age.  Continue Lopressor 50mg  BID  and Lasix 40mg  daily (starting tomorrow) as BP allows.    Will get back in afib clinic next week.  Will sign off.  Call with any questions.

## 2021-11-06 NOTE — Anesthesia Preprocedure Evaluation (Addendum)
Anesthesia Evaluation  Patient identified by MRN, date of birth, ID band Patient awake    Reviewed: Allergy & Precautions, NPO status , Patient's Chart, lab work & pertinent test results  Airway Mallampati: III  TM Distance: >3 FB Neck ROM: Full    Dental  (+) Missing   Pulmonary neg pulmonary ROS,    Pulmonary exam normal        Cardiovascular hypertension, +CHF  + dysrhythmias Atrial Fibrillation  Rhythm:Irregular Rate:Tachycardia     Neuro/Psych negative neurological ROS  negative psych ROS   GI/Hepatic Neg liver ROS, hiatal hernia,   Endo/Other  negative endocrine ROS  Renal/GU Renal disease     Musculoskeletal negative musculoskeletal ROS (+)   Abdominal (+) + obese,   Peds  Hematology negative hematology ROS (+)   Anesthesia Other Findings atrial fibrillation with RVR  Reproductive/Obstetrics                            Anesthesia Physical Anesthesia Plan  ASA: 4  Anesthesia Plan: MAC   Post-op Pain Management:    Induction: Intravenous  PONV Risk Score and Plan: 2 and Propofol infusion and Treatment may vary due to age or medical condition  Airway Management Planned: Nasal Cannula  Additional Equipment:   Intra-op Plan:   Post-operative Plan:   Informed Consent: I have reviewed the patients History and Physical, chart, labs and discussed the procedure including the risks, benefits and alternatives for the proposed anesthesia with the patient or authorized representative who has indicated his/her understanding and acceptance.     Dental advisory given  Plan Discussed with: CRNA  Anesthesia Plan Comments:         Anesthesia Quick Evaluation

## 2021-11-06 NOTE — CV Procedure (Signed)
   TRANSESOPHAGEAL ECHOCARDIOGRAM GUIDED DIRECT CURRENT CARDIOVERSION  NAME:  Alison Dunn    MRN: [id number] DOB:  1931/05/13    ADMIT DATE: 11/04/2021  INDICATIONS: Symptomatic atrial fibrillation  PROCEDURE:   Informed consent was obtained prior to the procedure. The risks, benefits and alternatives for the procedure were discussed and the patient comprehended these risks.  Risks include, but are not limited to, cough, sore throat, vomiting, nausea, somnolence, esophageal and stomach trauma or perforation, bleeding, low blood pressure, aspiration, pneumonia, infection, trauma to the teeth and death.    After a procedural time-out, the oropharynx was anesthetized and the patient was sedated by the anesthesia service. The transesophageal probe was inserted in the esophagus and stomach without difficulty and multiple views were obtained. Anesthesia was monitored by Dr. Roanna Banning.   COMPLICATIONS:    Complications: No complications Patient tolerated procedure well.  KEY FINDINGS:  Biatrial enlargement, PFO, mitral and tricuspid regurgitation. No LAA thrombus. Full Report to follow.   CARDIOVERSION:     Indications:  Symptomatic Atrial Fibrillation  Procedure Details:  Once the TEE was complete, the patient had the defibrillator pads placed in the anterior and posterior position. Once an appropriate level of sedation was confirmed, the patient was cardioverted x  with 200J of biphasic synchronized energy.  The patient converted to NSR.  There were no apparent complications.  The patient had normal neuro status and respiratory status post procedure with vitals stable as recorded elsewhere.  Adequate airway was maintained throughout and vital signs monitored per protocol.  Rudean Haskell, MD [place], [address] [place], [place] [address] [phone]  2:54 PM

## 2021-11-06 NOTE — Interval H&P Note (Signed)
History and Physical Interval Note:  11/06/2021 1:56 PM  Alison Dunn  has presented today for surgery, with the diagnosis of atrial fibrillation.  The various methods of treatment have been discussed with the patient and family. After consideration of risks, benefits and other options for treatment, the patient has consented to  Procedure(s): TRANSESOPHAGEAL ECHOCARDIOGRAM (TEE) (N/A) CARDIOVERSION (N/A) as a surgical intervention.  The patient's history has been reviewed, patient examined, no change in status, stable for surgery.  I have reviewed the patient's chart and labs.  Questions were answered to the patient's satisfaction.     Loc Feinstein A Latalia Etzler

## 2021-11-07 ENCOUNTER — Other Ambulatory Visit (HOSPITAL_COMMUNITY): Payer: Self-pay

## 2021-11-07 DIAGNOSIS — I5033 Acute on chronic diastolic (congestive) heart failure: Secondary | ICD-10-CM | POA: Diagnosis not present

## 2021-11-07 LAB — BASIC METABOLIC PANEL
Anion gap: 12 (ref 5–15)
BUN: 30 mg/dL — ABNORMAL HIGH (ref 8–23)
CO2: 29 mmol/L (ref 22–32)
Calcium: 9.1 mg/dL (ref 8.9–10.3)
Chloride: 100 mmol/L (ref 98–111)
Creatinine, Ser: 1.62 mg/dL — ABNORMAL HIGH (ref 0.44–1.00)
GFR, Estimated: 30 mL/min — ABNORMAL LOW (ref >60)
Glucose, Bld: 113 mg/dL — ABNORMAL HIGH (ref 70–99)
Potassium: 4.3 mmol/L (ref 3.5–5.1)
Sodium: 141 mmol/L (ref 135–145)

## 2021-11-07 LAB — PROTIME-INR
INR: 3.1 — ABNORMAL HIGH (ref 0.8–1.2)
Prothrombin Time: 31.6 seconds — ABNORMAL HIGH (ref 11.4–15.2)

## 2021-11-07 MED ORDER — POTASSIUM CHLORIDE CRYS ER 20 MEQ PO TBCR
40.0000 meq | EXTENDED_RELEASE_TABLET | Freq: Every day | ORAL | 0 refills | Status: DC
  Filled 2021-11-07: qty 30, 15d supply, fill #0

## 2021-11-07 MED ORDER — WARFARIN SODIUM 2 MG PO TABS: 2.0000 mg | ORAL_TABLET | Freq: Once | ORAL | Status: DC

## 2021-11-07 MED ORDER — FUROSEMIDE 20 MG PO TABS: 40.0000 mg | ORAL_TABLET | Freq: Every day | ORAL | 0 refills | Status: DC

## 2021-11-07 MED ORDER — METOPROLOL TARTRATE 50 MG PO TABS
50.0000 mg | ORAL_TABLET | Freq: Two times a day (BID) | ORAL | 0 refills | Status: DC
  Filled 2021-11-07: qty 60, 30d supply, fill #0

## 2021-11-07 NOTE — Progress Notes (Signed)
ANTICOAGULATION CONSULT NOTE - Follow Up Consult  Pharmacy Consult for warfarin Indication: atrial fibrillation and PMH pulmonary embolus  Allergies  Allergen Reactions   Codeine Nausea And Vomiting   Vicodin [Hydrocodone-Acetaminophen] Other (See Comments)    Doesn't like how it makes her feel    Patient Measurements: Height: 5\' 5"  (165.1 cm) Weight: 92 kg (202 lb 13.2 oz) IBW/kg (Calculated) : 57  Vital Signs: Temp: 98 F (36.7 C) (09/15 0545) Temp Source: Oral (09/15 0545) BP: 141/80 (09/15 0545) Pulse Rate: 77 (09/15 0545)  Labs: Recent Labs    11/04/21 1244 11/05/21 0508 11/06/21 0328 11/07/21 0417  HGB 14.3 14.8 13.9  --   HCT 45.5 45.7 42.7  --   PLT 218 183 183  --   LABPROT 33.0* 32.3* 31.9* 31.6*  INR 3.3* 3.2* 3.1* 3.1*  CREATININE 1.42* 1.35* 1.63* 1.62*     Estimated Creatinine Clearance: 26.9 mL/min (A) (by C-G formula based on SCr of 1.62 mg/dL (H)).   Medications:  Scheduled:   acetaminophen  1,000 mg Oral BID   ascorbic acid  500 mg Oral Daily   calcium carbonate  1,250 mg Oral Q breakfast   cholecalciferol  1,000 Units Oral Daily   ferrous sulfate  325 mg Oral Q breakfast   furosemide  40 mg Oral Daily   metoprolol tartrate  50 mg Oral BID   multivitamin with minerals  1 tablet Oral Daily   pantoprazole  40 mg Oral Daily   potassium chloride  40 mEq Oral TID   psyllium  1 packet Oral Daily   Warfarin - Pharmacist Dosing Inpatient   Does not apply q1600   Infusions:     Assessment: 88 YO F with PMH afib and bilateral PE in 2011 and 2013. Pt is on warfarin last dose 11/03/21 @4pm  PTA. Pharmacy consulted to dose warfarin while inpatient.   Warfarin regimen PTA: 3mg  daily CBC stable   9/12 INR 3.3 9/13 INR 3.2  9/14 INR 3.1 9/15 INR 3.1   Goal of Therapy:  INR 2-3 Monitor platelets by anticoagulation protocol: Yes   Plan:  Warfarin 2mg  by mouth x1 today @1600  F/u s/sx bleeding, CBC Daily INR    Wilson Singer,  PharmD Clinical Pharmacist 11/07/2021 7:33 AM

## 2021-11-07 NOTE — TOC Transition Note (Signed)
Transition of Care [facility]) - CM/SW Discharge Note   Patient Details  Name: GITTY OSTERLUND MRN: [id number] Date of Birth: 07/09/1931  [facility] CM/SW Contact:  Zenon Mayo, RN Phone Number: 11/07/2021, 8:26 AM   Clinical Narrative:    Patient  is for dc todlay, has no needs.         Patient Goals and CMS Choice        Discharge Placement                       Discharge Plan and Services                                     Social Determinants of Health (SDOH) Interventions Housing Interventions: Intervention Not Indicated Transportation Interventions: Intervention Not Indicated Utilities Interventions: Intervention Not Indicated Alcohol Usage Interventions: Intervention Not Indicated (Score <7) Financial Strain Interventions: Intervention Not Indicated   Readmission Risk Interventions     No data to display

## 2021-11-09 NOTE — Discharge Summary (Signed)
Physician Discharge Summary  Alison Dunn PJA:[id number] DOB: [birth year] DOA: 11/04/2021  PCP: Lajean Manes, MD  Admit date: 11/04/2021 Discharge date: 11/07/2021  Time spent: 35 minutes  Recommendations for Outpatient Follow-up:  CHF [facility] clinic on 9/25, please check BMP at follow-up Cardiology Dr. Radford Pax in 2 to 3 weeks   Discharge Diagnoses:  Principal Problem:   Acute on chronic diastolic CHF (congestive heart failure) (HCC) Atrial fibrillation with RVR   Non-small cell lung cancer ([place])   Atrial fibrillation (HCC)   Obesity (BMI 30-39.9)   Hypertension   Discharge Condition: Improved  Diet recommendation: Low so DM, heart healthy  Filed Weights   11/06/21 0514 11/06/21 1356 11/07/21 0545  Weight: 90.7 kg 90.7 kg 92 kg    History of present illness:  88/F with history of non-small cell lung cancer status post lobectomy, hypertension, bilateral PE in 2011 and 2013 on Coumadin, presented to the ED with progressive dyspnea on exertion, noted to be in A-fib RVR with fluid overload  Hospital Course:   Acute on chronic diastolic CHF Most likely related to rapid A-fib -Diuresed with IV Lasix, I's/O's are inaccurate but weight down 6-9 Lbs -Volume status improved, transition to oral Lasix, Aldactone -Echo noted EF of 34-19%, diastolic parameters could not be evaluated -Improved and stable, discharged home in stable condition   A-fib with RVR -Heart rate suboptimally controlled, echo with preserved EF -Quired Cardizem gtt. initially, home dose of propranolol discontinued -Continued on Lopressor, dose increased, remains on Coumadin, INR therapeutic, underwent TEE/DCCV yesterday afternoon, remains stable in sinus rhythm now, continue Lopressor 50 mg twice daily -Could consider transition to Eliquis as outpatient -Follow-up with cardiology   Left leg skin rash -present for >5years and unchanged, followed by Derm Dr.Jones -Do not suspect infection   Hypertension -Continue  diuretics as above, metoprolol   Obesity (BMI 30-39.9) BMI 35 Complicates overall prognosis and care   Non-small cell lung cancer [facility]) Status post left upper lobectomy Stable    Consultants: Cardiology  Procedures 9/14 TEE with cardioversion  Discharge Exam: Vitals:   11/07/21 0545 11/07/21 0829  BP: (!) 141/80 (!) 128/91  Pulse: 77 85  Resp: (!) 21   Temp: 98 F (36.7 C)   SpO2: 96%   General exam: Obese pleasant female sitting up in bed, AAOx3, no distress HEENT: No JVD CVS: S1-S2, RRR Lungs: Decreased breath sounds bases Abdomen: Soft, nontender, bowel sounds present Extremities: Trace edema Skin: L leg with areas of discoloration, scaling Psychiatry:  Mood & affect appropriate.    Discharge Instructions   Discharge Instructions     Diet - low sodium heart healthy   Complete by: As directed    Increase activity slowly   Complete by: As directed       Allergies as of 11/07/2021       Reactions   Codeine Nausea And Vomiting   Vicodin [hydrocodone-acetaminophen] Other (See Comments)   Doesn't like how it makes her feel        Medication List     STOP taking these medications    diltiazem 120 MG 24 hr capsule Commonly known as: CARDIZEM CD   propranolol ER 80 MG 24 hr capsule Commonly known as: INDERAL LA       TAKE these medications    acetaminophen 500 MG tablet Commonly known as: TYLENOL Take 1,000 mg by mouth 2 (two) times daily.   ascorbic acid 500 MG tablet Commonly known as: VITAMIN C Take 500 mg by mouth  daily.   calcium carbonate 600 MG Tabs tablet Commonly known as: OS-CAL Take 600 mg by mouth every morning. With Vitamin D-   cholecalciferol 1000 units tablet Commonly known as: VITAMIN D Take 1,000 Units by mouth daily.   Coumadin 3 MG tablet Generic drug: warfarin Take as directed. If you are unsure how to take this medication, talk to your nurse or doctor. Original instructions: 1 tablet (3 mg total) daily at 4pm.    ferrous sulfate 324 (65 Fe) MG Tbec Take 1 tablet by mouth every morning.   furosemide 20 MG tablet Commonly known as: LASIX Take 2 tablets (40 mg total) by mouth daily. What changed:  how much to take when to take this   LORazepam 0.5 MG tablet Commonly known as: ATIVAN Take 0.5 mg by mouth daily as needed for anxiety.   metoprolol tartrate 50 MG tablet Commonly known as: LOPRESSOR Take 1 tablet (50 mg total) by mouth 2 (two) times daily.   multivitamin with minerals Tabs tablet Take 1 tablet by mouth daily.   potassium chloride SA 20 MEQ tablet Commonly known as: KLOR-CON M Take 2 tablets (40 mEq total) by mouth daily.   psyllium 95 % Pack Commonly known as: HYDROCIL/METAMUCIL Take 1 packet by mouth daily.   RABEprazole 20 MG tablet Commonly known as: ACIPHEX Take 20 mg by mouth daily.   UNABLE TO FIND Lift Chair       Allergies  Allergen Reactions   Codeine Nausea And Vomiting   Vicodin [Hydrocodone-Acetaminophen] Other (See Comments)    Doesn't like how it makes her feel    Follow-up Information     [facility]. Go in 9 day(s).   Specialty: Cardiology Why: Hospital follow up PLEASE bring a current medication list to appointment FREE valet parking, [address]tr[address] off [address] Contact information: [address] [phone] [place]        Lajean Manes, MD. Schedule an appointment as soon as possible for a visit in 1 week(s).   Specialty: Internal Medicine Contact information: [address] [place] [address] [phone]         Lajean Manes, MD Follow up.   Specialty: Internal Medicine Contact information: [address]  [place] [address] [phone]                  The results of significant diagnostics from this hospitalization (including imaging, microbiology, ancillary and laboratory)  are listed below for reference.    Significant Diagnostic Studies: ECHO TEE  Result Date: 11/06/2021    TRANSESOPHOGEAL ECHO REPORT   Patient Name:   Alison Dunn Date of Exam: 11/06/2021 Medical Rec #:  [id number]    Height:       65.0 in Accession #:    [id number]   Weight:       200.0 lb Date of Birth:  15-Dec-1931     BSA:          1.978 m Patient Age:    88 years     BP:           135/108 mmHg Patient Gender: F            HR:           85 bpm. Exam Location:  Outpatient Procedure: Transesophageal Echo, 3D Echo, Cardiac Doppler and Color Doppler Indications:     Afib  History:  Patient has prior history of Echocardiogram examinations, most                  recent 11/04/2021. CHF, Arrythmias:Atrial Fibrillation; Risk                  Factors:Hypertension. Past history of lung cancer.  Sonographer:     Darlina Sicilian RDCS Referring Phys:  Vikki Ports, R Diagnosing Phys: Rudean Haskell MD PROCEDURE: After discussion of the risks and benefits of a TEE, an informed consent was obtained from the patient. TEE procedure time was 19 minutes. The transesophogeal probe was passed without difficulty through the esophogus of the patient. Imaged were obtained with the patient in a left lateral decubitus position. Sedation performed by different physician. Image quality was good. The patient's vital signs; including heart rate, blood pressure, and oxygen saturation; remained stable throughout the  procedure. The patient developed no complications during the procedure. A successful direct current cardioversion was performed at 200 joules with 1 attempt. IMPRESSIONS  1. Left ventricular ejection fraction, by estimation, is 50 to 55%. The left ventricle has low normal function.  2. Right ventricular systolic function is normal. The right ventricular size is normal.  3. Left atrial size was severely dilated. No left atrial/left atrial appendage thrombus was detected. The LAA emptying velocity was 18 cm/s.  4.  Right atrial size was severely dilated.  5. Suboptimal pulmonary vein Doppler; off axis imaging related to severe bi-atrial enlargement. 2D MR Volume 27 mL, 3D Vena Contracta 0.38 cm2. The mitral valve is normal in structure. Moderate mitral valve regurgitation. No evidence of mitral stenosis.  6. Tricuspid valve regurgitation is moderate.  7. Atrial functional, central valve disease.  8. The aortic valve is tricuspid. There is mild calcification of the aortic valve. There is mild thickening of the aortic valve. Aortic valve regurgitation is not visualized. No aortic stenosis is present.  9. There is Moderate (Grade III) plaque involving the descending aorta. 10. Evidence of atrial level shunting detected by color flow Doppler. There is a moderately sized patent foramen ovale with predominantly left to right shunting across the atrial septum. FINDINGS  Left Ventricle: Left ventricular ejection fraction, by estimation, is 50 to 55%. The left ventricle has low normal function. The left ventricular internal cavity size was normal in size. Right Ventricle: The right ventricular size is normal. No increase in right ventricular wall thickness. Right ventricular systolic function is normal. Left Atrium: Left atrial size was severely dilated. No left atrial/left atrial appendage thrombus was detected. The LAA emptying velocity was 18 cm/s. Right Atrium: Right atrial size was severely dilated. Pericardium: There is no evidence of pericardial effusion. Mitral Valve: Suboptimal pulmonary vein Doppler; off axis imaging related to severe bi-atrial enlargement. 2D MR Volume 27 mL, 3D Vena Contracta 0.38 cm2. The mitral valve is normal in structure. Moderate mitral valve regurgitation. No evidence of mitral  valve stenosis. Tricuspid Valve: The tricuspid valve is normal in structure. Tricuspid valve regurgitation is moderate. Aortic Valve: The aortic valve is tricuspid. There is mild calcification of the aortic valve. There is  mild thickening of the aortic valve. There is mild aortic valve annular calcification. Aortic valve regurgitation is not visualized. No aortic stenosis  is present. Pulmonic Valve: The pulmonic valve was normal in structure. Pulmonic valve regurgitation is not visualized. No evidence of pulmonic stenosis. Aorta: The aortic root, ascending aorta, aortic arch and descending aorta are all structurally normal, with no evidence of dilitation or obstruction. There  is moderate (Grade III) plaque involving the descending aorta. IAS/Shunts: Evidence of atrial level shunting detected by color flow Doppler. A moderately sized patent foramen ovale is detected with predominantly left to right shunting across the atrial septum.  MR Peak grad:    48.2 mmHg    TRICUSPID VALVE MR Mean grad:    32.0 mmHg    TR Peak grad:   11.6 mmHg MR Vmax:         347.00 cm/s  TR Vmax:        170.00 cm/s MR Vmean:        268.0 cm/s MR PISA:         1.90 cm MR PISA Eff ROA: 21 mm MR PISA Radius:  0.55 cm Rudean Haskell MD Electronically signed by Rudean Haskell MD Signature Date/Time: 11/06/2021/3:21:08 PM    Final    ECHOCARDIOGRAM COMPLETE  Result Date: 11/04/2021    ECHOCARDIOGRAM REPORT   Patient Name:   Alison Dunn Date of Exam: 11/04/2021 Medical Rec #:  [id number]    Height:       65.0 in Accession #:    [id number]   Weight:       210.4 lb Date of Birth:  November 29, 1931     BSA:          2.021 m Patient Age:    35 years     BP:           147/106 mmHg Patient Gender: F            HR:           115 bpm. Exam Location:  Inpatient Procedure: 2D Echo, Cardiac Doppler and Color Doppler Indications:    CHF  History:        Patient has no prior history of Echocardiogram examinations.                 Arrythmias:Atrial Fibrillation, Signs/Symptoms:Shortness of                 Breath; Risk Factors:Hypertension.  Sonographer:    Greer Pickerel Sonographer#2:  Raquel Sarna Senior Referring Phys: [id number] AGBATA  Sonographer Comments: Technically  difficult study due to poor echo windows. Image acquisition challenging due to respiratory motion and Image acquisition challenging due to patient body habitus. IMPRESSIONS  1. Left ventricular ejection fraction, by estimation, is 50 to 55%. The left ventricle has low normal function. The left ventricle has no regional wall motion abnormalities. Left ventricular diastolic function could not be evaluated.  2. Right ventricular systolic function is normal. The right ventricular size is mildly enlarged. There is normal pulmonary artery systolic pressure. The estimated right ventricular systolic pressure is 12.8 mmHg.  3. Left atrial size was moderately dilated.  4. Right atrial size was severely dilated.  5. The mitral valve is grossly normal. Moderate mitral valve regurgitation. No evidence of mitral stenosis.  6. Tricuspid valve regurgitation is mild to moderate.  7. The aortic valve is tricuspid. There is moderate calcification of the aortic valve. There is moderate thickening of the aortic valve. Aortic valve regurgitation is not visualized. Aortic valve sclerosis/calcification is present, without any evidence of aortic stenosis.  8. The inferior vena cava is normal in size with <50% respiratory variability, suggesting right atrial pressure of 8 mmHg. FINDINGS  Left Ventricle: Left ventricular ejection fraction, by estimation, is 50 to 55%. The left ventricle has low normal function. The left ventricle has no regional wall motion abnormalities. The left ventricular  internal cavity size was normal in size. There is no left ventricular hypertrophy. Left ventricular diastolic function could not be evaluated due to atrial fibrillation. Left ventricular diastolic function could not be evaluated. Right Ventricle: The right ventricular size is mildly enlarged. No increase in right ventricular wall thickness. Right ventricular systolic function is normal. There is normal pulmonary artery systolic pressure. The tricuspid  regurgitant velocity is 2.34  m/s, and with an assumed right atrial pressure of 8 mmHg, the estimated right ventricular systolic pressure is 81.0 mmHg. Left Atrium: Left atrial size was moderately dilated. Right Atrium: Right atrial size was severely dilated. Pericardium: There is no evidence of pericardial effusion. Presence of epicardial fat layer. Mitral Valve: The mitral valve is grossly normal. Moderate mitral valve regurgitation. No evidence of mitral valve stenosis. Tricuspid Valve: The tricuspid valve is grossly normal. Tricuspid valve regurgitation is mild to moderate. No evidence of tricuspid stenosis. Aortic Valve: The aortic valve is tricuspid. There is moderate calcification of the aortic valve. There is moderate thickening of the aortic valve. Aortic valve regurgitation is not visualized. Aortic valve sclerosis/calcification is present, without any  evidence of aortic stenosis. Pulmonic Valve: The pulmonic valve was grossly normal. Pulmonic valve regurgitation is not visualized. No evidence of pulmonic stenosis. Aorta: The aortic root and ascending aorta are structurally normal, with no evidence of dilitation. Venous: The inferior vena cava is normal in size with less than 50% respiratory variability, suggesting right atrial pressure of 8 mmHg. IAS/Shunts: The atrial septum is grossly normal.  LEFT VENTRICLE PLAX 2D LVIDd:         4.17 cm LVIDs:         3.92 cm LV PW:         0.90 cm LV IVS:        0.97 cm LVOT diam:     1.70 cm LV SV:         16 LV SV Index:   8 LVOT Area:     2.27 cm  LV Volumes (MOD) LV vol d, MOD A2C: 38.6 ml LV vol d, MOD A4C: 63.1 ml LV vol s, MOD A2C: 20.5 ml LV vol s, MOD A4C: 30.4 ml LV SV MOD A2C:     18.1 ml LV SV MOD A4C:     63.1 ml LV SV MOD BP:      24.3 ml RIGHT VENTRICLE RV Basal diam:  3.90 cm TAPSE (M-mode): 0.8 cm LEFT ATRIUM             Index        RIGHT ATRIUM           Index LA diam:        4.40 cm 2.18 cm/m   RA Area:     33.10 cm LA Vol (A2C):   99.8 ml  49.38 ml/m  RA Volume:   115.00 ml 56.90 ml/m LA Vol (A4C):   90.5 ml 44.78 ml/m LA Biplane Vol: 99.0 ml 48.98 ml/m  AORTIC VALVE LVOT Vmax:   51.70 cm/s LVOT Vmean:  34.200 cm/s LVOT VTI:    0.070 m  AORTA Ao Root diam: 3.30 cm Ao Asc diam:  2.90 cm TRICUSPID VALVE TR Peak grad:   21.9 mmHg TR Vmax:        234.00 cm/s  SHUNTS Systemic VTI:  0.07 m Systemic Diam: 1.70 cm Eleonore Chiquito MD Electronically signed by Eleonore Chiquito MD Signature Date/Time: 11/04/2021/4:50:22 PM    Final    DG Chest 1  View  Result Date: 11/04/2021 CLINICAL DATA:  Shortness of breath weight gain and lower extremity edema. EXAM: CHEST  1 VIEW COMPARISON:  Chest CT 09/04/2014 FINDINGS: The cardiac silhouette, mediastinal and hilar contours are within normal limits for age and given the AP projection. Underlying emphysematous changes. No vascular congestion or pulmonary edema. There are small bilateral pleural effusions and bibasilar atelectasis. IMPRESSION: Small bilateral pleural effusions and bibasilar atelectasis. Electronically Signed   By: Marijo Sanes M.D.   On: 11/04/2021 13:10    Microbiology: No results found for this or any previous visit (from the past 240 hour(s)).   Labs: Basic Metabolic Panel: Recent Labs  Lab 11/04/21 1244 11/05/21 0508 11/06/21 0328 11/07/21 0417  NA 140 140 137 141  K 3.8 3.4* 3.2* 4.3  CL 103 102 98 100  CO2 27 27 27 29   GLUCOSE 134* 137* 133* 113*  BUN 27* 24* 27* 30*  CREATININE 1.42* 1.35* 1.63* 1.62*  CALCIUM 9.1 8.9 8.8* 9.1   Liver Function Tests: No results for input(s): "AST", "ALT", "ALKPHOS", "BILITOT", "PROT", "ALBUMIN" in the last 168 hours. No results for input(s): "LIPASE", "AMYLASE" in the last 168 hours. No results for input(s): "AMMONIA" in the last 168 hours. CBC: Recent Labs  Lab 11/04/21 1244 11/05/21 0508 11/06/21 0328  WBC 8.2 6.9 6.6  NEUTROABS 5.4  --   --   HGB 14.3 14.8 13.9  HCT 45.5 45.7 42.7  MCV 100.7* 97.4 98.2  PLT 218 183 183    Cardiac Enzymes: No results for input(s): "CKTOTAL", "CKMB", "CKMBINDEX", "TROPONINI" in the last 168 hours. BNP: BNP (last 3 results) Recent Labs    11/04/21 1244  BNP 669.9*    ProBNP (last 3 results) No results for input(s): "PROBNP" in the last 8760 hours.  CBG: No results for input(s): "GLUCAP" in the last 168 hours.     Signed:  Domenic Polite MD.  [facility] 11/09/2021, 10:54 AM

## 2021-11-13 ENCOUNTER — Ambulatory Visit (HOSPITAL_COMMUNITY): Payer: Medicare Other | Admitting: Nurse Practitioner

## 2021-11-17 ENCOUNTER — Ambulatory Visit (HOSPITAL_COMMUNITY)
Admission: RE | Admit: 2021-11-17 | Discharge: 2021-11-17 | Disposition: A | Discharge status: Home / Self Care | Payer: Medicare Other | Source: Ambulatory Visit | Attending: Adult Health | Admitting: Adult Health

## 2021-11-17 ENCOUNTER — Telehealth (HOSPITAL_COMMUNITY): Payer: Self-pay | Admitting: *Deleted

## 2021-11-17 ENCOUNTER — Ambulatory Visit (HOSPITAL_BASED_OUTPATIENT_CLINIC_OR_DEPARTMENT_OTHER)
Admission: RE | Admit: 2021-11-17 | Discharge: 2021-11-17 | Disposition: A | Discharge status: Home / Self Care | Payer: Medicare Other | Source: Ambulatory Visit | Attending: Nurse Practitioner | Admitting: Nurse Practitioner

## 2021-11-17 ENCOUNTER — Encounter (HOSPITAL_COMMUNITY): Payer: Self-pay | Admitting: Physician Assistant

## 2021-11-17 VITALS — BP 150/90 | HR 73 | Ht 65.0 in | Wt 197.2 lb

## 2021-11-17 VITALS — BP 150/90 | HR 73 | Ht 65.0 in | Wt 197.0 lb

## 2021-11-17 DIAGNOSIS — I10 Essential (primary) hypertension: Secondary | ICD-10-CM

## 2021-11-17 DIAGNOSIS — I130 Hypertensive heart and chronic kidney disease with heart failure and stage 1 through stage 4 chronic kidney disease, or unspecified chronic kidney disease: Secondary | ICD-10-CM | POA: Insufficient documentation

## 2021-11-17 DIAGNOSIS — Z7901 Long term (current) use of anticoagulants: Secondary | ICD-10-CM | POA: Diagnosis not present

## 2021-11-17 DIAGNOSIS — Z86711 Personal history of pulmonary embolism: Secondary | ICD-10-CM | POA: Insufficient documentation

## 2021-11-17 DIAGNOSIS — Z885 Allergy status to narcotic agent status: Secondary | ICD-10-CM | POA: Diagnosis not present

## 2021-11-17 DIAGNOSIS — I480 Paroxysmal atrial fibrillation: Secondary | ICD-10-CM

## 2021-11-17 DIAGNOSIS — Z79899 Other long term (current) drug therapy: Secondary | ICD-10-CM | POA: Insufficient documentation

## 2021-11-17 DIAGNOSIS — I5032 Chronic diastolic (congestive) heart failure: Secondary | ICD-10-CM

## 2021-11-17 DIAGNOSIS — I4819 Other persistent atrial fibrillation: Secondary | ICD-10-CM | POA: Diagnosis not present

## 2021-11-17 DIAGNOSIS — D6869 Other thrombophilia: Secondary | ICD-10-CM | POA: Insufficient documentation

## 2021-11-17 DIAGNOSIS — Z85118 Personal history of other malignant neoplasm of bronchus and lung: Secondary | ICD-10-CM | POA: Diagnosis not present

## 2021-11-17 DIAGNOSIS — N1832 Chronic kidney disease, stage 3b: Secondary | ICD-10-CM | POA: Diagnosis not present

## 2021-11-17 LAB — BASIC METABOLIC PANEL
Anion gap: 8 (ref 5–15)
BUN: 26 mg/dL — ABNORMAL HIGH (ref 8–23)
CO2: 30 mmol/L (ref 22–32)
Calcium: 9.9 mg/dL (ref 8.9–10.3)
Chloride: 104 mmol/L (ref 98–111)
Creatinine, Ser: 1.2 mg/dL — ABNORMAL HIGH (ref 0.44–1.00)
GFR, Estimated: 44 mL/min — ABNORMAL LOW (ref >60)
Glucose, Bld: 120 mg/dL — ABNORMAL HIGH (ref 70–99)
Potassium: 5.3 mmol/L — ABNORMAL HIGH (ref 3.5–5.1)
Sodium: 142 mmol/L (ref 135–145)

## 2021-11-17 LAB — PROTIME-INR
INR: 1.7 — ABNORMAL HIGH (ref 0.8–1.2)
Prothrombin Time: 19.7 seconds — ABNORMAL HIGH (ref 11.4–15.2)

## 2021-11-17 LAB — BRAIN NATRIURETIC PEPTIDE: B Natriuretic Peptide: 425.7 pg/mL — ABNORMAL HIGH (ref 0.0–100.0)

## 2021-11-17 MED ORDER — FUROSEMIDE 40 MG PO TABS: 40.0000 mg | ORAL_TABLET | Freq: Every day | ORAL | 1 refills | Status: DC

## 2021-11-17 MED ORDER — POTASSIUM CHLORIDE CRYS ER 20 MEQ PO TBCR: 40.0000 meq | EXTENDED_RELEASE_TABLET | Freq: Every day | ORAL | 2 refills | Status: DC

## 2021-11-17 MED ORDER — POTASSIUM CHLORIDE CRYS ER 20 MEQ PO TBCR: 40.0000 meq | EXTENDED_RELEASE_TABLET | ORAL | 2 refills | Status: DC

## 2021-11-17 MED ORDER — FUROSEMIDE 20 MG PO TABS: 20.0000 mg | ORAL_TABLET | ORAL | 3 refills | Status: DC

## 2021-11-17 MED ORDER — METOPROLOL TARTRATE 50 MG PO TABS: 50.0000 mg | ORAL_TABLET | Freq: Two times a day (BID) | ORAL | 2 refills | Status: DC

## 2021-11-17 NOTE — Patient Instructions (Addendum)
Thank you for allowing [place] to provider your heart failure care after your recent hospitalization.   HOLD Lasix and Potassium for 3 days  then DECREASE Lasix to 20 mg three times per week  (Monday, Wednesday, and Friday) With Potassium 40 meq three times a week (Monday, Wednesday, and Friday)  Labs today We will only contact you if something comes back abnormal or we need to make some changes. Otherwise no news is good news!  You have been referred to [facility] Address: [address] [place] [address] [phone] -they will be in contact with an appointment  Do the following things EVERYDAY: Weigh yourself in the morning before breakfast. Write it down and keep it in a log. Take your medicines as prescribed Eat low salt foods--Limit salt (sodium) to 2000 mg per day.  Stay as active as you can everyday Limit all fluids for the day to less than 2 liters

## 2021-11-17 NOTE — Progress Notes (Signed)
ReDS Vest / Clip - 11/17/21 1010       ReDS Vest / Clip   Station Marker B    Ruler Value 36    ReDS Value Range Low volume    ReDS Actual Value 22

## 2021-11-17 NOTE — Progress Notes (Signed)
Primary Care Physician: Lajean Manes, MD Referring Physician:Dr. Marielys Trinidad is a 88 y.o. female with a h/o non small cell lung cancer with partial left lung resection, HTN that is in the afib clinic for f/u new onset seen by Dr. Felipa Eth last week.  He exchanged amlodipine for Cardizem and stopped hctz for lasix. She was previously on warfarin for "blood clots."  CHA2DS2VASc score is at least 7.   Since last week with med changes, she has not noted any improvement. She feels very short of breath and family is estimating a 10 weight gain with some weeping of the lower extremitates at times. She was hooked up to O2 as she was stating hard to breath. Her blood pressure was not able to be auscultated today  by 2 people.  EKG shows afib with v rates around 100 bpm. BP last week 118/74. Being unable to determine BP, and with advanced age and being symptomatic, I do not feel comfortable trying to  adjust her meds as as outpatient so discussed sending pt to the ER for further evaluation and treatment. They are in agreement. They said she was full functional at her age just a couple of weeks ago and now having difficulty walking across floor.   Follow up in the AF clinic 11/17/21. Patient was admitted from the ED and underwent TEE guided DCCV on 11/06/21. TEE showed biatrial enlargement with moderate PFO, moderate MR and TR and no LAA thrombus and low normal LVF EF 50-55%. She states she is feeling "100% better" since her hospitalization. Her breathing has greatly improved and she has more energy. She is in SR. She is down about 13 lbs.   Today, she denies symptoms of palpitations, chest pain, positive for shortness of breath, negative for dizziness, presyncope, syncope, or neurologic sequela.     Past Medical History:  Diagnosis Date   Hypertension    Non-small cell lung cancer (NSCLC) ([facility]) 2006   s/p surgery, no chemo/XRT needed   Past Surgical History:  Procedure Laterality Date    CARDIOVERSION N/A 11/06/2021   Procedure: CARDIOVERSION;  Surgeon: Werner Lean, MD;  Location: [facility];  Service: Cardiovascular;  Laterality: N/A;   HIP SURGERY     KNEE SURGERY     LUNG REMOVAL, PARTIAL     Left lower lobe removed    TEE WITHOUT CARDIOVERSION N/A 11/06/2021   Procedure: TRANSESOPHAGEAL ECHOCARDIOGRAM (TEE);  Surgeon: Werner Lean, MD;  Location: [facility] ENDOSCOPY;  Service: Cardiovascular;  Laterality: N/A;    Current Outpatient Medications  Medication Sig Dispense Refill   acetaminophen (TYLENOL) 500 MG tablet Take 1,000 mg by mouth 2 (two) times daily.     calcium carbonate (OS-CAL) 600 MG TABS Take 600 mg by mouth every morning. With Vitamin D-     cholecalciferol (VITAMIN D) 1000 UNITS tablet Take 1,000 Units by mouth daily.     COUMADIN 3 MG tablet 1 tablet (3 mg total) daily at 4pm.     ferrous sulfate 324 (65 Fe) MG TBEC Take 1 tablet by mouth every morning.     furosemide (LASIX) 20 MG tablet Take 2 tablets (40 mg total) by mouth daily.  0   LORazepam (ATIVAN) 0.5 MG tablet Take 0.5 mg by mouth daily as needed for anxiety.     metoprolol tartrate (LOPRESSOR) 50 MG tablet Take 1 tablet (50 mg total) by mouth 2 (two) times daily. 60 tablet 0   Multiple Vitamin (MULTIVITAMIN WITH MINERALS)  TABS Take 1 tablet by mouth daily.     potassium chloride SA (KLOR-CON M) 20 MEQ tablet Take 2 tablets (40 mEq total) by mouth daily. 30 tablet 0   psyllium (HYDROCIL/METAMUCIL) 95 % PACK Take 1 packet by mouth daily.     RABEprazole (ACIPHEX) 20 MG tablet Take 20 mg by mouth daily.     UNABLE TO FIND Lift Chair 1 each 0   vitamin C (ASCORBIC ACID) 500 MG tablet Take 500 mg by mouth daily.     No current facility-administered medications for this encounter.    Allergies  Allergen Reactions   Codeine Nausea And Vomiting   Vicodin [Hydrocodone-Acetaminophen] Other (See Comments)    Doesn't like how it makes her feel    Social History   Socioeconomic  History   Marital status: Widowed    Spouse name: Not on file   Number of children: 2   Years of education: Not on file   Highest education level: High school graduate  Occupational History   Occupation: retired  Tobacco Use   Smoking status: Never   Smokeless tobacco: Never   Tobacco comments:    Never smoke 11/15/21  Vaping Use   Vaping Use: Never used  Substance and Sexual Activity   Alcohol use: No   Drug use: Never   Sexual activity: Not on file  Other Topics Concern   Not on file  Social History Narrative   Not on file   Social Determinants of Health   Financial Resource Strain: Low Risk  (11/06/2021)   Overall Financial Resource Strain (CARDIA)    Difficulty of Paying Living Expenses: Not hard at all  Food Insecurity: No Food Insecurity (11/05/2021)   Hunger Vital Sign    Worried About Running Out of Food in the Last Year: Never true    [place] in the Last Year: Never true  Transportation Needs: No Transportation Needs (11/06/2021)   PRAPARE - Hydrologist (Medical): No    Lack of Transportation (Non-Medical): No  Physical Activity: Not on file  Stress: Not on file  Social Connections: Not on file  Intimate Partner Violence: Not At Risk (11/05/2021)   Humiliation, Afraid, Rape, and Kick questionnaire    Fear of Current or Ex-Partner: No    Emotionally Abused: No    Physically Abused: No    Sexually Abused: No    Family History  Problem Relation Age of Onset   Cataracts Mother    Cataracts Father    Cataracts Sister    Cataracts Brother    Glaucoma Brother     ROS- All systems are reviewed and negative except as per the HPI above  Physical Exam: Vitals:   11/17/21 0935  BP: (!) 150/90  Pulse: 73  Weight: 89.4 kg  Height: 5\' 5"  (1.651 m)    Wt Readings from Last 3 Encounters:  11/17/21 89.4 kg  11/17/21 89.4 kg  11/07/21 92 kg    Labs: Lab Results  Component Value Date   NA 141 11/07/2021   K 4.3  11/07/2021   CL 100 11/07/2021   CO2 29 11/07/2021   GLUCOSE 113 (H) 11/07/2021   BUN 30 (H) 11/07/2021   CREATININE 1.62 (H) 11/07/2021   CALCIUM 9.1 11/07/2021   Lab Results  Component Value Date   INR 3.1 (H) 11/07/2021   No results found for: "CHOL", "HDL", "LDLCALC", "TRIG"   GEN- The patient is a well appearing elderly female,  alert and oriented x 3 today.   HEENT-head normocephalic, atraumatic, sclera clear, conjunctiva pink, hearing intact, trachea midline. Lungs- Clear to ausculation bilaterally, normal work of breathing Heart- Regular rate and rhythm, no murmurs, rubs or gallops  GI- soft, NT, ND, + BS Extremities- no clubbing, cyanosis. Chronic discoloration/scaling MS- no significant deformity or atrophy Skin- no rash or lesion Psych- euthymic mood, full affect Neuro- strength and sensation are intact   EKG- SR Vent. rate 73 BPM PR interval 146 ms QRS duration 82 ms QT/QTcB 372/409 ms   CHA2DS2-VASc Score = 7  The patient's score is based upon: CHF History: 1 HTN History: 1 Diabetes History: 0 Stroke History: 2 Vascular Disease History: 0 Age Score: 2 Gender Score: 1       ASSESSMENT AND PLAN: 1. Persistent Atrial Fibrillation (ICD10:  I48.19) The patient's CHA2DS2-VASc score is 7, indicating a 11.2% annual risk of stroke.   Patient in [place] today. General education about afib provided and questions answered. We also discussed her stroke risk and the risks and benefits of anticoagulation. We discussed changing warfarin to Eliquis. She would like to wait to make this change until she discusses with her primary cardiologist.  Continue Lopressor 50 mg BID Continue warfarin for now  2. Secondary Hypercoagulable State (ICD10:  D68.69) The patient is at significant risk for stroke/thromboembolism based upon her CHA2DS2-VASc Score of 7.  Continue Warfarin (Coumadin).   3. Chronic HFpEF Fluid status appears stable today. She is down about 13 lbs.   4.  HTN BP mildly elevated today. Would trend over time, no changes today.    Follow up with [facility] clinic today. AF clinic in 6 months.    [facility] [address] [place], [place] [address] [phone]

## 2021-11-17 NOTE — Telephone Encounter (Signed)
Called to confirm Heart & Vascular Transitions of Care appointment at 11 am on 11/17/21. Patient reminded to bring all medications and pill box organizer with them. Confirmed patient has transportation. Gave directions, instructed to utilize [place] parking.  Confirmed appointment prior to ending call.    Earnestine Leys, BSN, Clinical cytogeneticist Only

## 2021-11-17 NOTE — Progress Notes (Signed)
HEART & VASCULAR TRANSITION OF CARE CONSULT NOTE     Referring Physician: Dr Broadus John Primary Care: Dr Felipa Eth  Primary Cardiologist: Dr Radford Pax   HPI: Referred to clinic by Dr Broadus John  for heart failure consultation.   Alison Dunn is a 88 year old with a history PAF, chronic HFpEF, bilateral PE 2011/2013, non small cell lung disease, and HTN.    Admitted 11/04/21 with A/C HFpEF + A fib RVR. BNP 669. Diuresed with IV lasix and transitioned to lasix 40 mg daily.  Placed on diltiazem followed by cardioversion. SR restored. Discharge weight 202 pounds.   She presents today with 3 family members. Overall feeling fine. Denies SOB/PND/Orthopnea. Appetite ok. No fever or chills. Weight at home has gone down 13 pounds. Lives alone. Able to drive short distanced. Taking all medications.  Cardiac Testing  11/06/21 TEE-->DC-CV Shock x1 -->SR  11/04/21 Echo EF 50-55% RV normal   Review of Systems: [y] = yes, [ ]  = no   General: Weight gain [ ] ; Weight loss [ ] ; Anorexia [ ] ; Fatigue [ ] ; Fever [ ] ; Chills [ ] ; Weakness [ ]   Cardiac: Chest pain/pressure [ ] ; Resting SOB [ ] ; Exertional SOB [ Y]; Orthopnea [ ] ; Pedal Edema [ ] ; Palpitations [ ] ; Syncope [ ] ; Presyncope [ ] ; Paroxysmal nocturnal dyspnea[ ]   Pulmonary: Cough [ ] ; Wheezing[ ] ; Hemoptysis[ ] ; Sputum [ ] ; Snoring [ ]   GI: Vomiting[ ] ; Dysphagia[ ] ; Melena[ ] ; Hematochezia [ ] ; Heartburn[ ] ; Abdominal pain [ ] ; Constipation [ ] ; Diarrhea [ ] ; BRBPR [ ]   GU: Hematuria[ ] ; Dysuria [ ] ; Nocturia[ ]   Vascular: Pain in legs with walking [ ] ; Pain in feet with lying flat [ ] ; Non-healing sores [ ] ; Stroke [ ] ; TIA [ ] ; Slurred speech [ ] ;  Neuro: Headaches[ ] ; Vertigo[ ] ; Seizures[ ] ; Paresthesias[ ] ;Blurred vision [ ] ; Diplopia [ ] ; Vision changes [ ]   Ortho/Skin: Arthritis [ ] ; Joint pain [Y ]; Muscle pain [ ] ; Joint swelling [ ] ; Back Pain [ Y]; Rash [ ]   Psych: Depression[ ] ; Anxiety[ ]   Heme: Bleeding problems [ ] ; Clotting disorders [ ] ;  Anemia [ ]   Endocrine: Diabetes [ ] ; Thyroid dysfunction[ ]    Past Medical History:  Diagnosis Date   Hypertension    Non-small cell lung cancer (NSCLC) ([place]) 2006   s/p surgery, no chemo/XRT needed    Current Outpatient Medications  Medication Sig Dispense Refill   acetaminophen (TYLENOL) 500 MG tablet Take 1,000 mg by mouth 2 (two) times daily.     calcium carbonate (OS-CAL) 600 MG TABS Take 600 mg by mouth every morning. With Vitamin D-     cholecalciferol (VITAMIN D) 1000 UNITS tablet Take 1,000 Units by mouth daily.     COUMADIN 3 MG tablet 1 tablet (3 mg total) daily at 4pm.     ferrous sulfate 324 (65 Fe) MG TBEC Take 1 tablet by mouth every morning.     LORazepam (ATIVAN) 0.5 MG tablet Take 0.5 mg by mouth daily as needed for anxiety.     Multiple Vitamin (MULTIVITAMIN WITH MINERALS) TABS Take 1 tablet by mouth daily.     psyllium (HYDROCIL/METAMUCIL) 95 % PACK Take 1 packet by mouth daily.     RABEprazole (ACIPHEX) 20 MG tablet Take 20 mg by mouth daily.     UNABLE TO FIND Lift Chair 1 each 0   vitamin C (ASCORBIC ACID) 500 MG tablet Take 500 mg by mouth daily.  furosemide (LASIX) 40 MG tablet Take 1 tablet (40 mg total) by mouth daily. 90 tablet 1   metoprolol tartrate (LOPRESSOR) 50 MG tablet Take 1 tablet (50 mg total) by mouth 2 (two) times daily. 180 tablet 2   potassium chloride SA (KLOR-CON M) 20 MEQ tablet Take 2 tablets (40 mEq total) by mouth daily. 180 tablet 2   No current facility-administered medications for this encounter.    Allergies  Allergen Reactions   Codeine Nausea And Vomiting   Vicodin [Hydrocodone-Acetaminophen] Other (See Comments)    Doesn't like how it makes her feel      Social History   Socioeconomic History   Marital status: Widowed    Spouse name: Not on file   Number of children: 2   Years of education: Not on file   Highest education level: High school graduate  Occupational History   Occupation: retired  Tobacco Use    Smoking status: Never   Smokeless tobacco: Never   Tobacco comments:    Never smoke 11/15/21  Vaping Use   Vaping Use: Never used  Substance and Sexual Activity   Alcohol use: No   Drug use: Never   Sexual activity: Not on file  Other Topics Concern   Not on file  Social History Narrative   Not on file   Social Determinants of Health   Financial Resource Strain: Low Risk  (11/06/2021)   Overall Financial Resource Strain (CARDIA)    Difficulty of Paying Living Expenses: Not hard at all  Food Insecurity: No Food Insecurity (11/05/2021)   Hunger Vital Sign    Worried About Running Out of Food in the Last Year: Never true    [place] in the Last Year: Never true  Transportation Needs: No Transportation Needs (11/06/2021)   PRAPARE - Hydrologist (Medical): No    Lack of Transportation (Non-Medical): No  Physical Activity: Not on file  Stress: Not on file  Social Connections: Not on file  Intimate Partner Violence: Not At Risk (11/05/2021)   Humiliation, Afraid, Rape, and Kick questionnaire    Fear of Current or Ex-Partner: No    Emotionally Abused: No    Physically Abused: No    Sexually Abused: No      Family History  Problem Relation Age of Onset   Cataracts Mother    Cataracts Father    Cataracts Sister    Cataracts Brother    Glaucoma Brother     Vitals:   11/17/21 1010  BP: (!) 150/90  Pulse: 73  Weight: 89.4 kg (197 lb)  Height: 5\' 5"  (1.651 m)   Wt Readings from Last 3 Encounters:  11/17/21 89.4 kg (197 lb)  11/17/21 89.4 kg (197 lb 3.2 oz)  11/07/21 92 kg (202 lb 13.2 oz)    PHYSICAL EXAM: General:  Arrived in a wheelchair. No respiratory difficulty HEENT: normal Neck: supple. no JVD. Carotids 2+ bilat; no bruits. No lymphadenopathy or thryomegaly appreciated. Cor: PMI nondisplaced. Regular rate & rhythm. No rubs, gallops or murmurs. Lungs: clear Abdomen: soft, nontender, nondistended. No hepatosplenomegaly. No  bruits or masses. Good bowel sounds. Extremities: no cyanosis, clubbing, rash, edema Neuro: alert & oriented x 3, cranial nerves grossly intact. moves all 4 extremities w/o difficulty. Affect pleasant.  ECG: SR personally reviewed.    ASSESSMENT & PLAN: 1. HFpEF Echo EF 50-55% RV normal  NYHA II/III GDMT  Diuretic- Reds Clip 22% . Volume status on the low side.  Hold lasix and potassium x3 days then start lasix 20 mg Mon-Wed-Fri with potassium 40 meq on lasix days.  BB-Continue current dose lopressor.  Ace/ARB/ARNI- hold off--> CKD Stage IIIb MRA- hold off -->CKD Stage IIIb SGLT2i- Hold off.  - Check BMET and BNP.  - Discussed low salt food choices.   2. PAF  -Maintaining SR. Continue current dose of bb.  - On coumadin. Can consider switching eliquis when she is seen in A fib/general cardiology.  -Followed in the A fib clinic.   3. HTN  Running high but just had her medications.   4. CKD Stage IIIb  Creatinine baseline ~ 1.4 - Check BMET      Referred to HFSW (PCP, Medications, Transportation, ETOH Abuse, Drug Abuse, Insurance, Financial ):No Refer to Pharmacy: No Refer to Home Health: No Refer to Advanced Heart Failure Clinic: No  Refer to General Cardiology: Yes  Follow up as needed.   Derak Schurman NP-C  10:37 AM

## 2021-11-20 ENCOUNTER — Telehealth (HOSPITAL_COMMUNITY): Payer: Self-pay | Admitting: *Deleted

## 2021-11-20 NOTE — Telephone Encounter (Signed)
Pts caregiver returned call after receiving mychart message about labs. He left a vm for retrn call. I called back and no answer/left vm for return call.

## 2021-11-25 ENCOUNTER — Telehealth (HOSPITAL_COMMUNITY): Payer: Self-pay | Admitting: Cardiology

## 2021-11-25 NOTE — Telephone Encounter (Signed)
Patient called.  Patient aware.  

## 2021-11-25 NOTE — Telephone Encounter (Signed)
-----   Message from Conrad Southgate, NP sent at 11/17/2021 12:06 PM EDT ----- Please forward to Dr Felipa Eth and call Ms Camerer.

## 2021-11-27 DIAGNOSIS — Z23 Encounter for immunization: Secondary | ICD-10-CM | POA: Diagnosis not present

## 2021-11-27 DIAGNOSIS — Z7901 Long term (current) use of anticoagulants: Secondary | ICD-10-CM | POA: Diagnosis not present

## 2021-12-29 DIAGNOSIS — Z7901 Long term (current) use of anticoagulants: Secondary | ICD-10-CM | POA: Diagnosis not present

## 2022-01-06 DIAGNOSIS — D0472 Carcinoma in situ of skin of left lower limb, including hip: Secondary | ICD-10-CM | POA: Diagnosis not present

## 2022-01-06 DIAGNOSIS — D1801 Hemangioma of skin and subcutaneous tissue: Secondary | ICD-10-CM | POA: Diagnosis not present

## 2022-01-06 DIAGNOSIS — Z85828 Personal history of other malignant neoplasm of skin: Secondary | ICD-10-CM | POA: Diagnosis not present

## 2022-01-06 DIAGNOSIS — L821 Other seborrheic keratosis: Secondary | ICD-10-CM | POA: Diagnosis not present

## 2022-01-21 ENCOUNTER — Ambulatory Visit: Payer: BLUE CROSS/BLUE SHIELD | Admitting: Cardiology

## 2022-01-21 ENCOUNTER — Ambulatory Visit: Payer: Medicare Other | Attending: Cardiology | Admitting: Cardiology

## 2022-01-21 ENCOUNTER — Encounter: Payer: Self-pay | Admitting: Cardiology

## 2022-01-21 VITALS — BP 125/75 | HR 84

## 2022-01-21 DIAGNOSIS — I5032 Chronic diastolic (congestive) heart failure: Secondary | ICD-10-CM | POA: Insufficient documentation

## 2022-01-21 DIAGNOSIS — N1832 Chronic kidney disease, stage 3b: Secondary | ICD-10-CM | POA: Insufficient documentation

## 2022-01-21 DIAGNOSIS — I10 Essential (primary) hypertension: Secondary | ICD-10-CM | POA: Diagnosis not present

## 2022-01-21 DIAGNOSIS — I480 Paroxysmal atrial fibrillation: Secondary | ICD-10-CM | POA: Diagnosis not present

## 2022-01-21 MED ORDER — APIXABAN 5 MG PO TABS: 5.0000 mg | ORAL_TABLET | Freq: Two times a day (BID) | ORAL | 3 refills | Status: DC

## 2022-01-21 NOTE — Patient Instructions (Addendum)
Medication Instructions:  Stop taking coumadin. Start taking Eliquis 5 mg BID tomorrow night (01/22/22). *If you need a refill on your cardiac medications before your next appointment, please call your pharmacy*   Lab Work: Your doctor has ordered lab work (a Medical laboratory scientific officer) for you. You may complete your lab work today in our lab before you leave.  If you have labs (blood work) drawn today and your tests are completely normal, you will receive your results only by: [place] (if you have MyChart) OR A paper copy in the mail If you have any lab test that is abnormal or we need to change your treatment, we will call you to review the results.   Testing/Procedures: None.   Follow-Up: At [facility], you and your health needs are our priority.  As part of our continuing mission to provide you with exceptional heart care, we have created designated Provider Care Teams.  These Care Teams include your primary Cardiologist (physician) and Advanced Practice Providers (APPs -  Physician Assistants and Nurse Practitioners) who all work together to provide you with the care you need, when you need it.  We recommend signing up for the patient portal called "MyChart".  Sign up information is provided on this After Visit Summary.  MyChart is used to connect with patients for Virtual Visits (Telemedicine).  Patients are able to view lab/test results, encounter notes, upcoming appointments, etc.  Non-urgent messages can be sent to your provider as well.   To learn more about what you can do with MyChart, go to [email or web address].    Your next appointment:   Tuesday, Jul 07, 2022 at 3:00 PM  The format for your next appointment:   In Person  Provider:   Fransico Him, MD     Important Information About Sugar

## 2022-01-21 NOTE — Addendum Note (Signed)
Addended by: Joni Reining on: 01/21/2022 01:47 PM   Modules accepted: Orders

## 2022-01-21 NOTE — Progress Notes (Signed)
Cardiology Office Note:    Date:  01/21/2022   ID:  Alison Dunn, DOB 01/10/1932, MRN [id number]  PCP:  Charlane Ferretti, MD  Cardiologist:  None    Referring MD: Conrad [place], NP   Chief Complaint  Patient presents with   Congestive Heart Failure   Atrial Fibrillation   Hypertension    History of Present Illness:    Alison Dunn is a 88 y.o. female with a hx of PAF, chronic HFpEF, bilateral PE 2011/2013, non small cell lung disease, and HTN.   Admitted 11/04/21 with A/C HFpEF + A fib RVR. BNP 669. Diuresed with IV lasix and transitioned to lasix 40 mg daily.  Placed on diltiazem followed by cardioversion. SR restored. Discharge weight 202 pounds.   She was seen in heart failure clinic on 11/17/2021 and was doing well.  2D echo showed EF 50 to 55% with normal RV function.  Since her discharge her weight has gone down 13 pounds.  Reds clip was 22%.  Her volume status was on the low side.  Her Lasix was held and then started on Lasix 20 mg Monday Wednesday and Friday with potassium on the same days.  She has not been on ACE/ARB/ARNI/MRA/SGLT2i due to CKD stage IIIb.  She currently is on Coumadin and beta-blocker.  She has been referred to A-fib clinic to follow-up for her atrial fibrillation.  She has now been referred to establish general cardiology care as she was doing well at her last heart failure visit and has been discharged from heart failure care  She is here today for followup and is doing well.  She denies any chest pain or pressure, SOB, DOE, PND, orthopnea, LE edema, dizziness (except for vertigo), palpitations or syncope. She is compliant with her meds and is tolerating meds with no SE.    Past Medical History:  Diagnosis Date   A-fib [facility])    Hypertension    Leg edema    Non-small cell lung cancer (NSCLC) ([place]) 2006   s/p surgery, no chemo/XRT needed    Past Surgical History:  Procedure Laterality Date   CARDIOVERSION N/A 11/06/2021   Procedure: CARDIOVERSION;  Surgeon:  Werner Lean, MD;  Location: [facility];  Service: Cardiovascular;  Laterality: N/A;   HIP SURGERY     KNEE SURGERY     LUNG REMOVAL, PARTIAL     Left lower lobe removed    TEE WITHOUT CARDIOVERSION N/A 11/06/2021   Procedure: TRANSESOPHAGEAL ECHOCARDIOGRAM (TEE);  Surgeon: Werner Lean, MD;  Location: [facility] ENDOSCOPY;  Service: Cardiovascular;  Laterality: N/A;    Current Medications: Current Meds  Medication Sig   acetaminophen (TYLENOL) 500 MG tablet Take 1,000 mg by mouth 2 (two) times daily.   calcium carbonate (OS-CAL) 600 MG TABS Take 600 mg by mouth every morning. With Vitamin D-   cholecalciferol (VITAMIN D) 1000 UNITS tablet Take 1,000 Units by mouth daily.   ferrous sulfate 324 (65 Fe) MG TBEC Take 1 tablet by mouth every morning.   furosemide (LASIX) 20 MG tablet Take 1 tablet (20 mg total) by mouth 3 (three) times a week. MONDAY, WEDNESDAY, FRIDAY   LORazepam (ATIVAN) 0.5 MG tablet Take 0.5 mg by mouth daily as needed for anxiety.   metoprolol tartrate (LOPRESSOR) 50 MG tablet Take 1 tablet (50 mg total) by mouth 2 (two) times daily.   Multiple Vitamin (MULTIVITAMIN WITH MINERALS) TABS Take 1 tablet by mouth daily.   psyllium (HYDROCIL/METAMUCIL) 95 %  PACK Take 1 packet by mouth daily.   RABEprazole (ACIPHEX) 20 MG tablet Take 20 mg by mouth daily.   UNABLE TO FIND Lift Chair   vitamin C (ASCORBIC ACID) 500 MG tablet Take 500 mg by mouth daily.   warfarin (COUMADIN) 4 MG tablet Take 4 mg by mouth every other day. Monday, Wednesday, Friday     Allergies:   Codeine and Vicodin [hydrocodone-acetaminophen]   Social History   Socioeconomic History   Marital status: Widowed    Spouse name: Not on file   Number of children: 2   Years of education: Not on file   Highest education level: High school graduate  Occupational History   Occupation: retired  Tobacco Use   Smoking status: Never   Smokeless tobacco: Never   Tobacco comments:    Never smoke  11/15/21  Vaping Use   Vaping Use: Never used  Substance and Sexual Activity   Alcohol use: No   Drug use: Never   Sexual activity: Not on file  Other Topics Concern   Not on file  Social History Narrative   Not on file   Social Determinants of Health   Financial Resource Strain: Low Risk  (11/06/2021)   Overall Financial Resource Strain (CARDIA)    Difficulty of Paying Living Expenses: Not hard at all  Food Insecurity: No Food Insecurity (11/05/2021)   Hunger Vital Sign    Worried About Running Out of Food in the Last Year: Never true    [place] in the Last Year: Never true  Transportation Needs: No Transportation Needs (11/06/2021)   PRAPARE - Hydrologist (Medical): No    Lack of Transportation (Non-Medical): No  Physical Activity: Not on file  Stress: Not on file  Social Connections: Not on file     Family History: The patient's family history includes Cataracts in her brother, father, mother, and sister; Glaucoma in her brother.  ROS:   Please see the history of present illness.    ROS  All other systems reviewed and negative.   EKGs/Labs/Other Studies Reviewed:    The following studies were reviewed today: 2D echo  EKG:  EKG is not ordered today.    Recent Labs: 11/05/2021: TSH 2.101 11/06/2021: Hemoglobin 13.9; Platelets 183 11/17/2021: B Natriuretic Peptide 425.7; BUN 26; Creatinine, Ser 1.20; Potassium 5.3; Sodium 142   Recent Lipid Panel No results found for: "CHOL", "TRIG", "HDL", "CHOLHDL", "VLDL", "LDLCALC", "LDLDIRECT"  CHA2DS2-VASc Score = 7 [CHF History: 1, HTN History: 1, Diabetes History: 0, Stroke History: 2, Vascular Disease History: 0, Age Score: 2, Gender Score: 1].  Therefore, the patient's annual risk of stroke is 11.2 %.            Physical Exam:    VS:  BP 125/75 (BP Location: Right Arm, Patient Position: Sitting)   Pulse 84   SpO2 97%     Wt Readings from Last 3 Encounters:  11/17/21 197 lb (89.4  kg)  11/17/21 197 lb 3.2 oz (89.4 kg)  11/07/21 202 lb 13.2 oz (92 kg)     GEN:  Well nourished, well developed in no acute distress HEENT: Normal NECK: No JVD; No carotid bruits LYMPHATICS: No lymphadenopathy CARDIAC: RRR, no murmurs, rubs, gallops RESPIRATORY:  Clear to auscultation without rales, wheezing or rhonchi  ABDOMEN: Soft, non-tender, non-distended MUSCULOSKELETAL:  No edema; No deformity  SKIN: Warm and dry NEUROLOGIC:  Alert and oriented x 3 PSYCHIATRIC:  Normal affect  ASSESSMENT:    1. Chronic diastolic heart failure ([place])   2. PAF (paroxysmal atrial fibrillation) ([place])   3. Hypertension, unspecified type   4. Stage 3b chronic kidney disease (HCC)    PLAN:    In order of problems listed above:  Chronic diastolic CHF -she appears euvolemic on exam today -weight is stable and she denies any SOB or LE edema -continue prescription drug management with Lasix 20mg  M-W-F -no ACEI/ARB/ARNi/MRA/SGLT2i due to CKD stage 3b -check BMET  PAF -maintaining NSR on exam today -continue prescription drug management with Lopressor 50mg  BID -followed in afib clinic -she would like to change from warfarin to Eliquis so will get pharmacy in to get her started  HTN -BP controlled on exam today -continue prescription drug management with Lopressor 50mg  BID with PRN refills  CKD stage 3b -followed by PCP  Followup with me in 6 months  Time Spent: 20 minutes total time of encounter, including 15 minutes spent in face-to-face patient care on the date of this encounter. This time includes coordination of care and counseling regarding above mentioned problem list. Remainder of non-face-to-face time involved reviewing chart documents/testing relevant to the patient encounter and documentation in the medical record. I have independently reviewed documentation from referring provider  Medication Adjustments/Labs and Tests Ordered: Current medicines are reviewed at length with  the patient today.  Concerns regarding medicines are outlined above.  No orders of the defined types were placed in this encounter.  No orders of the defined types were placed in this encounter.   Signed, Fransico Him, MD  01/21/2022 1:16 PM    [facility]

## 2022-01-22 LAB — BASIC METABOLIC PANEL
BUN/Creatinine Ratio: 19 (ref 12–28)
BUN: 20 mg/dL (ref 8–27)
CO2: 26 mmol/L (ref 20–29)
Calcium: 10.1 mg/dL (ref 8.7–10.3)
Chloride: 101 mmol/L (ref 96–106)
Creatinine, Ser: 1.05 mg/dL — ABNORMAL HIGH (ref 0.57–1.00)
Glucose: 96 mg/dL (ref 70–99)
Potassium: 4.6 mmol/L (ref 3.5–5.2)
Sodium: 142 mmol/L (ref 134–144)
eGFR: 51 mL/min/{1.73_m2} — ABNORMAL LOW (ref >59)

## 2022-01-23 ENCOUNTER — Other Ambulatory Visit: Payer: Self-pay

## 2022-01-23 MED ORDER — APIXABAN 5 MG PO TABS: 5.0000 mg | ORAL_TABLET | Freq: Two times a day (BID) | ORAL | 3 refills | Status: DC

## 2022-01-23 NOTE — Telephone Encounter (Signed)
Prescription refill request for Eliquis received. Indication:afib Last office visit:11/23 Scr:1.0 Age: 88 Weight:89.4  kg  Prescription refilled

## 2022-01-24 ENCOUNTER — Encounter: Payer: Self-pay | Admitting: Cardiology

## 2022-01-26 NOTE — Telephone Encounter (Addendum)
Called and spoke with pharmacist, Anderson Malta at [facility] who stated they could not refill Eliquis because Warfarin and Eliquis were on the pt's medication list in their system. Made her aware that Warfarin had been discontinued and pt was prescribed Eliquis 5mg  BID. Verbalized understanding and stated refill will be sent to pt as soon as possible.   Called pt and made her aware.

## 2022-04-02 ENCOUNTER — Encounter (HOSPITAL_COMMUNITY): Payer: Self-pay | Admitting: *Deleted

## 2022-05-19 ENCOUNTER — Ambulatory Visit (HOSPITAL_COMMUNITY)
Admission: RE | Admit: 2022-05-19 | Discharge: 2022-05-19 | Disposition: A | Discharge status: Home / Self Care | Payer: Medicare Other | Source: Ambulatory Visit | Attending: Physician Assistant | Admitting: Physician Assistant

## 2022-05-19 VITALS — BP 148/100 | HR 76 | Ht 65.0 in | Wt 193.2 lb

## 2022-05-19 DIAGNOSIS — R600 Localized edema: Secondary | ICD-10-CM | POA: Insufficient documentation

## 2022-05-19 DIAGNOSIS — D6869 Other thrombophilia: Secondary | ICD-10-CM | POA: Diagnosis not present

## 2022-05-19 DIAGNOSIS — I4891 Unspecified atrial fibrillation: Secondary | ICD-10-CM | POA: Diagnosis not present

## 2022-05-19 DIAGNOSIS — I5032 Chronic diastolic (congestive) heart failure: Secondary | ICD-10-CM | POA: Diagnosis not present

## 2022-05-19 DIAGNOSIS — R0602 Shortness of breath: Secondary | ICD-10-CM | POA: Diagnosis not present

## 2022-05-19 DIAGNOSIS — Z85118 Personal history of other malignant neoplasm of bronchus and lung: Secondary | ICD-10-CM | POA: Diagnosis not present

## 2022-05-19 DIAGNOSIS — I110 Hypertensive heart disease with heart failure: Secondary | ICD-10-CM | POA: Diagnosis not present

## 2022-05-19 DIAGNOSIS — I4819 Other persistent atrial fibrillation: Secondary | ICD-10-CM | POA: Insufficient documentation

## 2022-05-19 DIAGNOSIS — I480 Paroxysmal atrial fibrillation: Secondary | ICD-10-CM | POA: Diagnosis not present

## 2022-05-19 DIAGNOSIS — Z7901 Long term (current) use of anticoagulants: Secondary | ICD-10-CM | POA: Diagnosis not present

## 2022-05-19 DIAGNOSIS — Z79899 Other long term (current) drug therapy: Secondary | ICD-10-CM | POA: Insufficient documentation

## 2022-05-19 NOTE — Progress Notes (Signed)
Primary Care Physician: Charlane Ferretti, MD Referring Physician:Dr. Ladye Woolcock is a 89 y.o. female with a h/o non small cell lung cancer with partial left lung resection, HTN that is in the afib clinic for f/u new onset seen by Dr. Felipa Eth.  He exchanged amlodipine for Cardizem and stopped hctz for lasix. She was previously on warfarin for "blood clots."  CHA2DS2VASc score is at least 7.   Since last week with med changes, she has not noted any improvement. She feels very short of breath and family is estimating a 10 weight gain with some weeping of the lower extremitates at times. She was hooked up to O2 as she was stating hard to breath. Her blood pressure was not able to be auscultated today  by 2 people.  EKG shows afib with v rates around 100 bpm. BP last week 118/74. Being unable to determine BP, and with advanced age and being symptomatic, I do not feel comfortable trying to  adjust her meds as as outpatient so discussed sending pt to the ER for further evaluation and treatment. They are in agreement. They said she was full functional at her age just a couple of weeks ago and now having difficulty walking across floor.   On follow up today, she is doing well overall. No recent episodes of Afib. She is down 2 lbs from last office visit. She is compliant with lopressor and eliquis. No bleeding concerns. She lives by herself and still able to do daily activities without issue. No worsening shortness of breath, chest pain, or lower extremity edema. She notes her left lower extremity has chronic swelling > right extremity secondary to history of blood clots. She is in SR today.   Today, she denies symptoms of palpitations, chest pain, positive for shortness of breath, negative for dizziness, presyncope, syncope, or neurologic sequela.     Past Medical History:  Diagnosis Date   Chronic diastolic CHF (congestive heart failure) (HCC)    Hypertension    Leg edema    Non-small cell  lung cancer (NSCLC) ([place]) 2006   s/p surgery, no chemo/XRT needed   Paroxysmal A-fib [facility])    Past Surgical History:  Procedure Laterality Date   CARDIOVERSION N/A 11/06/2021   Procedure: CARDIOVERSION;  Surgeon: Werner Lean, MD;  Location: [place];  Service: Cardiovascular;  Laterality: N/A;   HIP SURGERY     KNEE SURGERY     LUNG REMOVAL, PARTIAL     Left lower lobe removed    TEE WITHOUT CARDIOVERSION N/A 11/06/2021   Procedure: TRANSESOPHAGEAL ECHOCARDIOGRAM (TEE);  Surgeon: Werner Lean, MD;  Location: [facility] ENDOSCOPY;  Service: Cardiovascular;  Laterality: N/A;    Current Outpatient Medications  Medication Sig Dispense Refill   acetaminophen (TYLENOL) 500 MG tablet Take 1,000 mg by mouth 2 (two) times daily.     apixaban (ELIQUIS) 5 MG TABS tablet Take 1 tablet (5 mg total) by mouth 2 (two) times daily. 180 tablet 3   calcium carbonate (OS-CAL) 600 MG TABS Take 600 mg by mouth every morning. With Vitamin D-     cholecalciferol (VITAMIN D) 1000 UNITS tablet Take 1,000 Units by mouth daily.     ferrous sulfate 324 (65 Fe) MG TBEC Take 1 tablet by mouth every morning.     furosemide (LASIX) 20 MG tablet Take 1 tablet (20 mg total) by mouth 3 (three) times a week. MONDAY, WEDNESDAY, FRIDAY 45 tablet 3   LORazepam (ATIVAN) 0.5  MG tablet Take 0.5 mg by mouth daily as needed for anxiety.     metoprolol tartrate (LOPRESSOR) 50 MG tablet Take 1 tablet (50 mg total) by mouth 2 (two) times daily. 180 tablet 2   Multiple Vitamin (MULTIVITAMIN WITH MINERALS) TABS Take 1 tablet by mouth daily.     psyllium (HYDROCIL/METAMUCIL) 95 % PACK Take 1 packet by mouth daily.     RABEprazole (ACIPHEX) 20 MG tablet Take 20 mg by mouth daily.     tretinoin (RETIN-A) 0.05 % cream Apply topically as needed.     UNABLE TO FIND Lift Chair 1 each 0   vitamin C (ASCORBIC ACID) 500 MG tablet Take 500 mg by mouth daily.     No current facility-administered medications for this encounter.     Allergies  Allergen Reactions   Codeine Nausea And Vomiting   Vicodin [Hydrocodone-Acetaminophen] Other (See Comments)    Doesn't like how it makes her feel    Social History   Socioeconomic History   Marital status: Widowed    Spouse name: Not on file   Number of children: 2   Years of education: Not on file   Highest education level: High school graduate  Occupational History   Occupation: retired  Tobacco Use   Smoking status: Never   Smokeless tobacco: Never   Tobacco comments:    Never smoke 11/15/21  Vaping Use   Vaping Use: Never used  Substance and Sexual Activity   Alcohol use: No   Drug use: Never   Sexual activity: Not on file  Other Topics Concern   Not on file  Social History Narrative   Not on file   Social Determinants of Health   Financial Resource Strain: Low Risk  (11/06/2021)   Overall Financial Resource Strain (CARDIA)    Difficulty of Paying Living Expenses: Not hard at all  Food Insecurity: No Food Insecurity (11/05/2021)   Hunger Vital Sign    Worried About Running Out of Food in the Last Year: Never true    [place] in the Last Year: Never true  Transportation Needs: No Transportation Needs (11/06/2021)   PRAPARE - Hydrologist (Medical): No    Lack of Transportation (Non-Medical): No  Physical Activity: Not on file  Stress: Not on file  Social Connections: Not on file  Intimate Partner Violence: Not At Risk (11/05/2021)   Humiliation, Afraid, Rape, and Kick questionnaire    Fear of Current or Ex-Partner: No    Emotionally Abused: No    Physically Abused: No    Sexually Abused: No    Family History  Problem Relation Age of Onset   Cataracts Mother    Cataracts Father    Cataracts Sister    Cataracts Brother    Glaucoma Brother     ROS- All systems are reviewed and negative except as per the HPI above  Physical Exam: Vitals:   05/19/22 1053  Weight: 87.6 kg  Height: 5\' 5"  (1.651 m)     Wt Readings from Last 3 Encounters:  05/19/22 87.6 kg  11/17/21 89.4 kg  11/17/21 89.4 kg    Labs: Lab Results  Component Value Date   NA 142 01/21/2022   K 4.6 01/21/2022   CL 101 01/21/2022   CO2 26 01/21/2022   GLUCOSE 96 01/21/2022   BUN 20 01/21/2022   CREATININE 1.05 (H) 01/21/2022   CALCIUM 10.1 01/21/2022   Lab Results  Component Value Date  INR 1.7 (H) 11/17/2021   No results found for: "CHOL", "HDL", "[place]", "TRIG"   GEN- The patient is a well appearing elderly female, alert and oriented x 3 today. Sitting in wheelchair.   HEENT-head normocephalic, atraumatic, sclera clear, conjunctiva pink, hearing intact, trachea midline. Lungs- Clear to ausculation bilaterally, normal work of breathing Heart- Regular rate and rhythm, no murmurs, rubs or gallops  GI- soft, NT, ND, + BS Extremities- no clubbing, cyanosis. +1 pitting edema right lower extremity, edema left lower extremity > right MS- no significant deformity or atrophy Skin- no rash or lesion Psych- euthymic mood, full affect Neuro- strength and sensation are intact   EKG- SR with sinus arrhythmia Vent. rate 76 BPM PR interval 142 ms QRS duration 78 ms QT/QTcB 374/420 ms   CHA2DS2-VASc Score = 7  The patient's score is based upon: CHF History: 1 HTN History: 1 Diabetes History: 0 Stroke History: 2 Vascular Disease History: 0 Age Score: 2 Gender Score: 1       ASSESSMENT AND PLAN: 1. Persistent Atrial Fibrillation (ICD10:  I48.19) The patient's CHA2DS2-VASc score is 7, indicating a 11.2% annual risk of stroke.    Patient in [place] today.  No changes to current regimen today. Continue Lopressor 50 mg BID  2. Secondary Hypercoagulable State (ICD10:  D68.69) The patient is at significant risk for stroke/thromboembolism based upon her CHA2DS2-VASc Score of 7.  Continue Eliquis BID.  No bleeding concerns today.  3. Chronic HFpEF Fluid status appears stable today. No changes to current  lasix regimen. She understands to call provider if significant weight gain noted. She weighs herself everyday.   4. HTN BP mildly elevated today. Advised to record home BP measurements and call 1-2 weeks if high readings.    Follow up AF clinic 6 months after OV with cardiologist.   Emily Filbert, PA-C [facility] [address] [place], [place] [address] [phone]

## 2022-07-02 ENCOUNTER — Encounter: Payer: Self-pay | Admitting: Cardiology

## 2022-07-02 MED ORDER — METOPROLOL TARTRATE 50 MG PO TABS: 50.0000 mg | ORAL_TABLET | Freq: Two times a day (BID) | ORAL | 2 refills | Status: DC

## 2022-07-07 ENCOUNTER — Ambulatory Visit: Payer: BLUE CROSS/BLUE SHIELD | Admitting: Cardiology

## 2022-07-16 DIAGNOSIS — Z8739 Personal history of other diseases of the musculoskeletal system and connective tissue: Secondary | ICD-10-CM | POA: Diagnosis not present

## 2022-07-16 DIAGNOSIS — Z86711 Personal history of pulmonary embolism: Secondary | ICD-10-CM | POA: Diagnosis not present

## 2022-07-16 DIAGNOSIS — Z6835 Body mass index (BMI) 35.0-35.9, adult: Secondary | ICD-10-CM | POA: Diagnosis not present

## 2022-07-16 DIAGNOSIS — I129 Hypertensive chronic kidney disease with stage 1 through stage 4 chronic kidney disease, or unspecified chronic kidney disease: Secondary | ICD-10-CM | POA: Diagnosis not present

## 2022-07-16 DIAGNOSIS — E1121 Type 2 diabetes mellitus with diabetic nephropathy: Secondary | ICD-10-CM | POA: Diagnosis not present

## 2022-07-16 DIAGNOSIS — G250 Essential tremor: Secondary | ICD-10-CM | POA: Diagnosis not present

## 2022-07-16 DIAGNOSIS — K909 Intestinal malabsorption, unspecified: Secondary | ICD-10-CM | POA: Diagnosis not present

## 2022-07-16 DIAGNOSIS — I480 Paroxysmal atrial fibrillation: Secondary | ICD-10-CM | POA: Diagnosis not present

## 2022-07-16 DIAGNOSIS — D508 Other iron deficiency anemias: Secondary | ICD-10-CM | POA: Diagnosis not present

## 2022-07-16 DIAGNOSIS — Z0000 Encounter for general adult medical examination without abnormal findings: Secondary | ICD-10-CM | POA: Diagnosis not present

## 2022-07-16 DIAGNOSIS — N1832 Chronic kidney disease, stage 3b: Secondary | ICD-10-CM | POA: Diagnosis not present

## 2022-07-16 DIAGNOSIS — Z7901 Long term (current) use of anticoagulants: Secondary | ICD-10-CM | POA: Diagnosis not present

## 2022-07-16 DIAGNOSIS — Z1331 Encounter for screening for depression: Secondary | ICD-10-CM | POA: Diagnosis not present

## 2022-11-04 DIAGNOSIS — Z961 Presence of intraocular lens: Secondary | ICD-10-CM | POA: Diagnosis not present

## 2022-11-04 DIAGNOSIS — H40021 Open angle with borderline findings, high risk, right eye: Secondary | ICD-10-CM | POA: Diagnosis not present

## 2022-11-04 DIAGNOSIS — H401123 Primary open-angle glaucoma, left eye, severe stage: Secondary | ICD-10-CM | POA: Diagnosis not present

## 2022-11-04 DIAGNOSIS — Z947 Corneal transplant status: Secondary | ICD-10-CM | POA: Diagnosis not present

## 2022-12-02 ENCOUNTER — Ambulatory Visit: Payer: Medicare Other | Attending: Cardiology | Admitting: Cardiology

## 2022-12-02 ENCOUNTER — Encounter: Payer: Self-pay | Admitting: Cardiology

## 2022-12-02 VITALS — BP 140/82 | HR 76 | Ht 65.0 in | Wt 189.2 lb

## 2022-12-02 DIAGNOSIS — I5032 Chronic diastolic (congestive) heart failure: Secondary | ICD-10-CM | POA: Diagnosis present

## 2022-12-02 DIAGNOSIS — N1832 Chronic kidney disease, stage 3b: Secondary | ICD-10-CM | POA: Diagnosis present

## 2022-12-02 DIAGNOSIS — I480 Paroxysmal atrial fibrillation: Secondary | ICD-10-CM | POA: Diagnosis present

## 2022-12-02 DIAGNOSIS — Z23 Encounter for immunization: Secondary | ICD-10-CM | POA: Diagnosis not present

## 2022-12-02 DIAGNOSIS — I10 Essential (primary) hypertension: Secondary | ICD-10-CM | POA: Diagnosis not present

## 2022-12-02 NOTE — Progress Notes (Signed)
Cardiology Office Note:    Date:  12/02/2022   ID:  Alison Dunn, DOB [birth year], MRN [id number]  PCP:  Thana Ates, MD  Cardiologist:  Armanda Magic, MD    Referring MD: Thana Ates, MD   Chief Complaint  Patient presents with   Congestive Heart Failure   Atrial Fibrillation   Hypertension    History of Present Illness:    Alison Dunn is a 89 y.o. female with a hx of PAF, chronic HFpEF, bilateral PE 2011/2013, non small cell lung disease, and HTN.   Admitted 11/04/21 with A/C HFpEF + A fib RVR. BNP 669. Diuresed with IV lasix and transitioned to lasix 40 mg daily.  Placed on diltiazem followed by cardioversion. SR restored. Discharge weight 202 pounds.   She was seen in heart failure clinic on 11/17/2021 and was doing well.  2D echo showed EF 50 to 55% with normal RV function.  Since her discharge her weight has gone down 13 pounds.  Reds clip was 22%.  Her volume status was on the low side.  Her Lasix was held and then started on Lasix 20 mg Monday Wednesday and Friday with potassium on the same days.  She has not been on ACE/ARB/ARNI/MRA/SGLT2i due to CKD stage IIIb.  She currently is on Coumadin and beta-blocker.  She has been referred to A-fib clinic to follow-up for her atrial fibrillation.  [facility].  She is here today for followup and is doing well.  She denies any chest pain or pressure, SOB, DOE, PND, orthopnea, LE edema, dizziness, palpitations or syncope. She is compliant with her meds and is tolerating meds with no SE.     Past Medical History:  Diagnosis Date   Chronic diastolic CHF (congestive heart failure) (HCC)    Hypertension    Leg edema    Non-small cell lung cancer (NSCLC) (HCC) 2006   s/p surgery, no chemo/XRT needed   Paroxysmal A-fib [facility])     Past Surgical History:  Procedure Laterality Date   CARDIOVERSION N/A 11/06/2021   Procedure: CARDIOVERSION;  Surgeon: Christell Constant, MD;  Location: [facility] ENDOSCOPY;   Service: Cardiovascular;  Laterality: N/A;   HIP SURGERY     KNEE SURGERY     LUNG REMOVAL, PARTIAL     Left lower lobe removed    TEE WITHOUT CARDIOVERSION N/A 11/06/2021   Procedure: TRANSESOPHAGEAL ECHOCARDIOGRAM (TEE);  Surgeon: Christell Constant, MD;  Location: [facility] ENDOSCOPY;  Service: Cardiovascular;  Laterality: N/A;    Current Medications: Current Meds  Medication Sig   acetaminophen (TYLENOL) 500 MG tablet Take 1,000 mg by mouth 2 (two) times daily.   apixaban (ELIQUIS) 5 MG TABS tablet Take 1 tablet (5 mg total) by mouth 2 (two) times daily.   calcium carbonate (OS-CAL) 600 MG TABS Take 600 mg by mouth every morning. With Vitamin D-   cholecalciferol (VITAMIN D) 1000 UNITS tablet Take 1,000 Units by mouth daily.   ferrous sulfate 324 (65 Fe) MG TBEC Take 1 tablet by mouth every morning.   furosemide (LASIX) 20 MG tablet Take 1 tablet (20 mg total) by mouth 3 (three) times a week. MONDAY, WEDNESDAY, FRIDAY   LORazepam (ATIVAN) 0.5 MG tablet Take 0.5 mg by mouth daily as needed for anxiety.   metoprolol tartrate (LOPRESSOR) 50 MG tablet Take 1 tablet (50 mg total) by mouth 2 (two) times daily.   Multiple Vitamin (MULTIVITAMIN WITH MINERALS) TABS Take  1 tablet by mouth daily.   psyllium (HYDROCIL/METAMUCIL) 95 % PACK Take 1 packet by mouth daily.   RABEprazole (ACIPHEX) 20 MG tablet Take 20 mg by mouth daily.   tretinoin (RETIN-A) 0.05 % cream Apply topically as needed.   UNABLE TO FIND Lift Chair   vitamin C (ASCORBIC ACID) 500 MG tablet Take 500 mg by mouth daily.     Allergies:   Codeine and Vicodin [hydrocodone-acetaminophen]   Social History   Socioeconomic History   Marital status: Widowed    Spouse name: Not on file   Number of children: 2   Years of education: Not on file   Highest education level: High school graduate  Occupational History   Occupation: retired  Tobacco Use   Smoking status: Never   Smokeless tobacco: Never   Tobacco comments:    Never  smoke 11/15/21  Vaping Use   Vaping status: Never Used  Substance and Sexual Activity   Alcohol use: No   Drug use: Never   Sexual activity: Not on file  Other Topics Concern   Not on file  Social History Narrative   Not on file   Social Determinants of Health   Financial Resource Strain: Low Risk  (11/06/2021)   Overall Financial Resource Strain (CARDIA)    Difficulty of Paying Living Expenses: Not hard at all  Food Insecurity: No Food Insecurity (11/05/2021)   Hunger Vital Sign    Worried About Running Out of Food in the Last Year: Never true    Ran Out of Food in the Last Year: Never true  Transportation Needs: No Transportation Needs (11/06/2021)   PRAPARE - Administrator, Civil Service (Medical): No    Lack of Transportation (Non-Medical): No  Physical Activity: Not on file  Stress: Not on file  Social Connections: Not on file     Family History: The patient's family history includes Cataracts in her brother, father, mother, and sister; Glaucoma in her brother.  ROS:   Please see the history of present illness.    ROS  All other systems reviewed and negative.   EKGs/Labs/Other Studies Reviewed:    The following studies were reviewed today: EKG Interpretation Date/Time:  Wednesday December 02 2022 13:44:24 EDT Ventricular Rate:  81 PR Interval:  120 QRS Duration:  78 QT Interval:  360 QTC Calculation: 418 R Axis:   65  Text Interpretation: Sinus rhythm with Premature atrial complexes Septal infarct (cited on or before 04-Nov-2021) When compared with ECG of 19-May-2022 11:11, Premature atrial complexes are now Present Questionable change in initial forces of Septal leads Confirmed by Armanda Magic ([id number]) on 12/02/2022 1:49:32 PM     Recent Labs: 01/21/2022: BUN 20; Creatinine, Ser 1.05; Potassium 4.6; Sodium 142   Recent Lipid Panel No results found for: "CHOL", "TRIG", "HDL", "CHOLHDL", "VLDL", "LDLCALC", "LDLDIRECT"   Physical Exam:    VS:   BP (!) 140/82   Pulse 76   Ht 5\' 5"  (1.651 m)   Wt 189 lb 3.2 oz (85.8 kg)   SpO2 97%   BMI 31.48 kg/m     Wt Readings from Last 3 Encounters:  12/02/22 189 lb 3.2 oz (85.8 kg)  05/19/22 193 lb 3.2 oz (87.6 kg)  11/17/21 197 lb (89.4 kg)     GEN: Well nourished, well developed in no acute distress HEENT: Normal NECK: No JVD; No carotid bruits LYMPHATICS: No lymphadenopathy CARDIAC:RRR, no murmurs, rubs, gallops, occasional ectopy RESPIRATORY:  Clear to auscultation without rales,  wheezing or rhonchi  ABDOMEN: Soft, non-tender, non-distended MUSCULOSKELETAL:  No edema; No deformity  SKIN: Warm and dry NEUROLOGIC:  Alert and oriented x 3 PSYCHIATRIC:  Normal affect  ASSESSMENT:    1. Chronic diastolic heart failure (HCC)   2. PAF (paroxysmal atrial fibrillation) (HCC)   3. Hypertension, unspecified type   4. Stage 3b chronic kidney disease (HCC)     PLAN:    In order of problems listed above:  Chronic diastolic CHF -she appears euvolemic on exam today -her weight remains stable and denies any SOB or LE edema -continue prescription drug management with Lasix 20mg  3 times weekly -no ACEI/ARB/ARNi/MRA/SGLT2i due to CKD stage 3b -I have personally reviewed and interpreted outside labs performed by patient's PCP which showed SCr 1.13 and K+ 4.9 on 07/16/2022  PAF -she remains in NSR and denies any palpitations but does have PACs and sinus arrhythmia on EKG today -followed in afib clinic -continue prescription drug management with Apixaban 5mg  BID and Lopressor 50mg  BID with PRN refills -denies any bleeding problems on DOAC -I have personally reviewed and interpreted outside labs performed by patient's PCP which showed Hbg 14, SCr 1.13 and K+ 4.9 on 07/16/2022  HTN -Bp controlled on exam today -continue Lopressor 50mg  BID  CKD stage 3b -followed by PCP  Followup with me in 6 months  Time Spent: 20 minutes total time of encounter, including 15 minutes spent in  face-to-face patient care on the date of this encounter. This time includes coordination of care and counseling regarding above mentioned problem list. Remainder of non-face-to-face time involved reviewing chart documents/testing relevant to the patient encounter and documentation in the medical record. I have independently reviewed documentation from referring provider  Medication Adjustments/Labs and Tests Ordered: Current medicines are reviewed at length with the patient today.  Concerns regarding medicines are outlined above.  No orders of the defined types were placed in this encounter.  No orders of the defined types were placed in this encounter.   Signed, Armanda Magic, MD  12/02/2022 1:03 PM    [facility]

## 2022-12-02 NOTE — Patient Instructions (Signed)
Medication Instructions:  Your physician recommends that you continue on your current medications as directed. Please refer to the Current Medication list given to you today.  *If you need a refill on your cardiac medications before your next appointment, please call your pharmacy*   Lab Work: None.  If you have labs (blood work) drawn today and your tests are completely normal, you will receive your results only by: [place] (if you have MyChart) OR A paper copy in the mail If you have any lab test that is abnormal or we need to change your treatment, we will call you to review the results.   Testing/Procedures: None.   Follow-Up:   Your next appointment:   6 month(s)  Provider:   Fransico Him, MD

## 2022-12-03 ENCOUNTER — Encounter: Payer: Self-pay | Admitting: Cardiology

## 2022-12-16 ENCOUNTER — Other Ambulatory Visit: Payer: Self-pay | Admitting: Cardiology

## 2022-12-16 DIAGNOSIS — I480 Paroxysmal atrial fibrillation: Secondary | ICD-10-CM

## 2022-12-16 NOTE — Telephone Encounter (Signed)
Prescription refill request for Eliquis received. Indication: a fib Last office visit:12/02/22 Scr: 1.05 01/21/22 epic Age: 89 Weight: 85kg

## 2023-01-13 ENCOUNTER — Ambulatory Visit (HOSPITAL_COMMUNITY): Payer: BLUE CROSS/BLUE SHIELD | Admitting: Internal Medicine

## 2023-01-18 DIAGNOSIS — R42 Dizziness and giddiness: Secondary | ICD-10-CM | POA: Diagnosis not present

## 2023-01-18 DIAGNOSIS — Z8739 Personal history of other diseases of the musculoskeletal system and connective tissue: Secondary | ICD-10-CM | POA: Diagnosis not present

## 2023-01-18 DIAGNOSIS — N1831 Chronic kidney disease, stage 3a: Secondary | ICD-10-CM | POA: Diagnosis not present

## 2023-01-18 DIAGNOSIS — E1121 Type 2 diabetes mellitus with diabetic nephropathy: Secondary | ICD-10-CM | POA: Diagnosis not present

## 2023-01-18 DIAGNOSIS — I129 Hypertensive chronic kidney disease with stage 1 through stage 4 chronic kidney disease, or unspecified chronic kidney disease: Secondary | ICD-10-CM | POA: Diagnosis not present

## 2023-02-25 ENCOUNTER — Encounter: Payer: Self-pay | Admitting: Cardiology

## 2023-02-25 MED ORDER — METOPROLOL TARTRATE 50 MG PO TABS: 50.0000 mg | ORAL_TABLET | Freq: Two times a day (BID) | ORAL | 2 refills | Status: DC

## 2023-05-19 NOTE — Progress Notes (Unsigned)
 Cardiology Office Note:    Date:  05/20/2023   ID:  Alison Dunn, DOB October 24, 1931, MRN [id number]  PCP:  Thana Ates, MD  Cardiologist:  Armanda Magic, MD    Referring MD: Thana Ates, MD   Chief Complaint  Patient presents with   Congestive Heart Failure   Atrial Fibrillation   Hypertension    History of Present Illness:    Alison Dunn is a 89 y.o. female with a hx of PAF, chronic HFpEF, bilateral PE 2011/2013, non small cell lung disease, and HTN.   Admitted 11/04/21 with A/C HFpEF + A fib RVR. BNP 669. Diuresed with IV lasix and transitioned to lasix 40 mg daily.  Placed on diltiazem followed by cardioversion. SR restored. Discharge weight 202 pounds.   She was seen in heart failure clinic on 11/17/2021 and was doing well.  2D echo showed EF 50 to 55% with normal RV function.  Since her discharge her weight has gone down 13 pounds.  Reds clip was 22%.  Her volume status was on the low side.  Her Lasix was held and then started on Lasix 20 mg Monday Wednesday and Friday with potassium on the same days.  She has not been on ACE/ARB/ARNI/MRA/SGLT2i due to CKD stage IIIb.  She currently is on Coumadin and beta-blocker.  She has been referred to A-fib clinic to follow-up for her atrial fibrillation.  She was discharged from [facility].  SHe is here today for followup and is doing well.  She has chronic DOE mainly with overexertion and chronic LE edema. Her family feels her SOB and LE edema are stable since I saw her last. She denies any chest pain or pressure,  PND, orthopnea, LE edema, dizziness, palpitations or syncope. She still lives by  herself. She is compliant with her meds and is tolerating meds with no SE.    Past Medical History:  Diagnosis Date   Chronic diastolic CHF (congestive heart failure) (HCC)    Hypertension    Leg edema    Non-small cell lung cancer (NSCLC) (HCC) 2006   s/p surgery, no chemo/XRT needed   Paroxysmal A-fib [facility])     Past Surgical  History:  Procedure Laterality Date   CARDIOVERSION N/A 11/06/2021   Procedure: CARDIOVERSION;  Surgeon: Christell Constant, MD;  Location: [facility] ENDOSCOPY;  Service: Cardiovascular;  Laterality: N/A;   HIP SURGERY     KNEE SURGERY     LUNG REMOVAL, PARTIAL     Left lower lobe removed    TEE WITHOUT CARDIOVERSION N/A 11/06/2021   Procedure: TRANSESOPHAGEAL ECHOCARDIOGRAM (TEE);  Surgeon: Christell Constant, MD;  Location: [facility] ENDOSCOPY;  Service: Cardiovascular;  Laterality: N/A;    Current Medications: Current Meds  Medication Sig   acetaminophen (TYLENOL) 500 MG tablet Take 1,000 mg by mouth 2 (two) times daily.   apixaban (ELIQUIS) 5 MG TABS tablet TAKE 1 TABLET TWICE A DAY   calcium carbonate (OS-CAL) 600 MG TABS Take 600 mg by mouth every morning. With Vitamin D-   cholecalciferol (VITAMIN D) 1000 UNITS tablet Take 1,000 Units by mouth daily.   ferrous sulfate 324 (65 Fe) MG TBEC Take 1 tablet by mouth every morning.   furosemide (LASIX) 20 MG tablet Take 1 tablet (20 mg total) by mouth 3 (three) times a week. MONDAY, WEDNESDAY, FRIDAY   meclizine (ANTIVERT) 25 MG tablet Take 25 mg by mouth 2 (two) times daily as needed.   metoprolol tartrate (  LOPRESSOR) 50 MG tablet Take 1 tablet (50 mg total) by mouth 2 (two) times daily.   Multiple Vitamin (MULTIVITAMIN WITH MINERALS) TABS Take 1 tablet by mouth daily.   psyllium (HYDROCIL/METAMUCIL) 95 % PACK Take 1 packet by mouth daily.   RABEprazole (ACIPHEX) 20 MG tablet Take 20 mg by mouth daily.   tretinoin (RETIN-A) 0.05 % cream Apply topically as needed.   UNABLE TO FIND Lift Chair   vitamin C (ASCORBIC ACID) 500 MG tablet Take 500 mg by mouth daily.     Allergies:   Codeine and Vicodin [hydrocodone-acetaminophen]   Social History   Socioeconomic History   Marital status: Widowed    Spouse name: Not on file   Number of children: 2   Years of education: Not on file   Highest education level: High school graduate   Occupational History   Occupation: retired  Tobacco Use   Smoking status: Never   Smokeless tobacco: Never   Tobacco comments:    Never smoke 11/15/21  Vaping Use   Vaping status: Never Used  Substance and Sexual Activity   Alcohol use: No   Drug use: Never   Sexual activity: Not on file  Other Topics Concern   Not on file  Social History Narrative   Not on file   Social Drivers of Health   Financial Resource Strain: Low Risk  (11/06/2021)   Overall Financial Resource Strain (CARDIA)    Difficulty of Paying Living Expenses: Not hard at all  Food Insecurity: No Food Insecurity (11/05/2021)   Hunger Vital Sign    Worried About Running Out of Food in the Last Year: Never true    Ran Out of Food in the Last Year: Never true  Transportation Needs: No Transportation Needs (11/06/2021)   PRAPARE - Administrator, Civil Service (Medical): No    Lack of Transportation (Non-Medical): No  Physical Activity: Not on file  Stress: Not on file  Social Connections: Not on file     Family History: The patient's family history includes Cataracts in her brother, father, mother, and sister; Glaucoma in her brother.  ROS:   Please see the history of present illness.    ROS  All other systems reviewed and negative.   EKGs/Labs/Other Studies Reviewed:    The following studies were reviewed today:  EKG Interpretation Date/Time:  Thursday May 20 2023 13:50:54 EDT Ventricular Rate:  108 PR Interval:    QRS Duration:  80 QT Interval:  316 QTC Calculation: 423 R Axis:   -8  Text Interpretation: Atrial fibrillation with rapid ventricular response Anteroseptal infarct (cited on or before 04-Nov-2021) When compared with ECG of 02-Dec-2022 13:44, Atrial fibrillation has replaced Sinus rhythm Questionable change in initial forces of Anterior leads Confirmed by Armanda Magic ([id number]) on 05/20/2023 2:02:17 PM       Recent Labs: No results found for requested labs within last 365  days.   Recent Lipid Panel No results found for: "CHOL", "TRIG", "HDL", "CHOLHDL", "VLDL", "LDLCALC", "LDLDIRECT"   Physical Exam:    VS:  BP (!) 160/100   Pulse 66   Ht 5\' 5"  (1.651 m)   Wt 183 lb (83 kg)   SpO2 96%   BMI 30.45 kg/m     Wt Readings from Last 3 Encounters:  05/20/23 183 lb (83 kg)  12/02/22 189 lb 3.2 oz (85.8 kg)  05/19/22 193 lb 3.2 oz (87.6 kg)     GEN: Well nourished, well developed in no  acute distress HEENT: Normal NECK: No JVD; No carotid bruits LYMPHATICS: No lymphadenopathy CARDIAC:irregularly irregular, no murmurs, rubs, gallops RESPIRATORY:  Clear to auscultation without rales, wheezing or rhonchi  ABDOMEN: Soft, non-tender, non-distended MUSCULOSKELETAL:  1+ LLE edema; No deformity  SKIN: Warm and dry NEUROLOGIC:  Alert and oriented x 3 PSYCHIATRIC:  Normal affect  ASSESSMENT:    1. Chronic diastolic heart failure (HCC)   2. PAF (paroxysmal atrial fibrillation) (HCC)   3. Hypertension, unspecified type   4. Stage 3b chronic kidney disease (HCC)      PLAN:    In order of problems listed above:  Chronic diastolic CHF -She is she appears euvolemic on exam today -she has chronic DOE with extreme exertion and chronic LE edema which are stable -Continue prescription drug management with Lasix 20 mg on Monday Wednesday and Friday,  -check BMET, BNP  PAF -She is made maintaining normal sinus rhythm on exam and denies any palpitations -Followed in A-fib clinic-followed in afib clinic -Continue prescription drug management with Eliquis 5 mg twice daily  -increase Lopressor to 75 mg twice daily to get HR better controlled -denies any bleeding problems on DOAC -Check CBC -get into afib clinic next week  HTN -BP is elevated today  -she has CKD so will avoid ACE/ARB -Increase Lopressor to 75mg  BID  CKD stage 3b -followed by PCP  Followup with me in 6 months  Time Spent: 20 minutes total time of encounter, including 15 minutes  spent in face-to-face patient care on the date of this encounter. This time includes coordination of care and counseling regarding above mentioned problem list. Remainder of non-face-to-face time involved reviewing chart documents/testing relevant to the patient encounter and documentation in the medical record. I have independently reviewed documentation from referring provider  Medication Adjustments/Labs and Tests Ordered: Current medicines are reviewed at length with the patient today.  Concerns regarding medicines are outlined above.  No orders of the defined types were placed in this encounter.  No orders of the defined types were placed in this encounter.   Signed, Armanda Magic, MD  05/20/2023 1:42 PM    [facility]

## 2023-05-20 ENCOUNTER — Ambulatory Visit: Payer: BLUE CROSS/BLUE SHIELD | Attending: Cardiology | Admitting: Cardiology

## 2023-05-20 ENCOUNTER — Other Ambulatory Visit: Payer: Self-pay

## 2023-05-20 ENCOUNTER — Encounter: Payer: Self-pay | Admitting: Cardiology

## 2023-05-20 VITALS — BP 160/100 | HR 66 | Ht 65.0 in | Wt 183.0 lb

## 2023-05-20 DIAGNOSIS — I10 Essential (primary) hypertension: Secondary | ICD-10-CM | POA: Insufficient documentation

## 2023-05-20 DIAGNOSIS — I480 Paroxysmal atrial fibrillation: Secondary | ICD-10-CM | POA: Insufficient documentation

## 2023-05-20 DIAGNOSIS — I5032 Chronic diastolic (congestive) heart failure: Secondary | ICD-10-CM | POA: Insufficient documentation

## 2023-05-20 DIAGNOSIS — N1832 Chronic kidney disease, stage 3b: Secondary | ICD-10-CM | POA: Diagnosis not present

## 2023-05-20 MED ORDER — METOPROLOL TARTRATE 50 MG PO TABS: 75.0000 mg | ORAL_TABLET | Freq: Two times a day (BID) | ORAL | 3 refills | Status: DC

## 2023-05-20 NOTE — Patient Instructions (Signed)
 Medication Instructions:  Your physician has recommended you make the following change in your medication:  1) INCREASE Lopressor (metoprolol tartrate) to 75 mg daily *If you need a refill on your cardiac medications before your next appointment, please call your pharmacy*   Lab Work: TODAY: BMET, BNP, CBC   Follow-Up: At [facility], you and your health needs are our priority.  As part of our continuing mission to provide you with exceptional heart care, we have created designated Provider Care Teams.  These Care Teams include your primary Cardiologist (physician) and Advanced Practice Providers (APPs -  Physician Assistants and Nurse Practitioners) who all work together to provide you with the care you need, when you need it.  Follow up with the Atrial Fibrillation Clinic next week - they will call you an appointment time.   Your next appointment:   6 months  Provider:   Armanda Magic, MD

## 2023-05-20 NOTE — Addendum Note (Signed)
 Addended by: Frutoso Schatz on: 05/20/2023 02:10 PM   Modules accepted: Orders

## 2023-05-21 ENCOUNTER — Telehealth: Payer: Self-pay

## 2023-05-21 ENCOUNTER — Telehealth: Payer: Self-pay | Admitting: Internal Medicine

## 2023-05-21 ENCOUNTER — Ambulatory Visit: Admission: EM | Admit: 2023-05-21 | Discharge: 2023-05-21 | Disposition: A | Discharge status: Home / Self Care

## 2023-05-21 DIAGNOSIS — I509 Heart failure, unspecified: Secondary | ICD-10-CM

## 2023-05-21 DIAGNOSIS — E875 Hyperkalemia: Secondary | ICD-10-CM

## 2023-05-21 LAB — BASIC METABOLIC PANEL WITH GFR
BUN/Creatinine Ratio: 24 (ref 12–28)
BUN: 26 mg/dL (ref 8–27)
CO2: 27 mmol/L (ref 20–29)
Calcium: 10.6 mg/dL — ABNORMAL HIGH (ref 8.7–10.3)
Chloride: 100 mmol/L (ref 96–106)
Creatinine, Ser: 1.08 mg/dL — ABNORMAL HIGH (ref 0.57–1.00)
Glucose: 88 mg/dL (ref 70–99)
Potassium: 5.8 mmol/L (ref 3.5–5.2)
Sodium: 142 mmol/L (ref 134–144)
eGFR: 49 mL/min/{1.73_m2} — ABNORMAL LOW (ref >59)

## 2023-05-21 LAB — CBC
Hematocrit: 46.6 % (ref 34.0–46.6)
Hemoglobin: 15.7 g/dL (ref 11.1–15.9)
MCH: 31.3 pg (ref 26.6–33.0)
MCHC: 33.7 g/dL (ref 31.5–35.7)
MCV: 93 fL (ref 79–97)
Platelets: 237 10*3/uL (ref 150–450)
RBC: 5.01 x10E6/uL (ref 3.77–5.28)
RDW: 12.7 % (ref 11.7–15.4)
WBC: 9 10*3/uL (ref 3.4–10.8)

## 2023-05-21 LAB — PRO B NATRIURETIC PEPTIDE: NT-Pro BNP: 7378 pg/mL — ABNORMAL HIGH (ref 0–738)

## 2023-05-21 MED ORDER — FUROSEMIDE 20 MG PO TABS
20.0000 mg | ORAL_TABLET | ORAL | 3 refills | Status: DC
Start: 2023-05-21 — End: 2023-05-24

## 2023-05-21 MED ORDER — APIXABAN 5 MG PO TABS: 5.0000 mg | ORAL_TABLET | Freq: Two times a day (BID) | ORAL | 3 refills | Status: DC

## 2023-05-21 NOTE — Addendum Note (Signed)
 Addended by: Virl Axe, Stirling Orton L on: 05/21/2023 12:13 PM   Modules accepted: Orders

## 2023-05-21 NOTE — Telephone Encounter (Signed)
 Pt last saw Dr Mayford Knife 05/20/23, last labs 05/20/23 Creat 1.08, age 89, weight 83kg, based on specified criteria pt is on appropriate dosage of Eliquis 5mg  BID for afib.  Will refill rx.

## 2023-05-21 NOTE — ED Notes (Signed)
 Pt presented to UC inquiring about routine repeat lab per cardiology office. Discussed with provider and reported per UC protocol unable to obtain routine labs in this setting. Discussed with pt and pt family. Verbalized understanding and elected to reach out to Cardiology office about next steps.   Pt and pt family left prior to triage being started/completed. NP aware.

## 2023-05-21 NOTE — Telephone Encounter (Signed)
-----   Message from Armanda Magic sent at 05/21/2023  4:18 PM EDT ----- Regarding: RE: hyperkalemia Recommend going to urgent care to get this repeated today ----- Message ----- From: Ethelda Chick, RN Sent: 05/21/2023  12:13 PM EDT To: Quintella Reichert, MD; Perlie Gold, PA-C; # Subject: RE: hyperkalemia                               Called patient was going to have her get BMET repeated per DOD, but she can't get to lab until Monday. ----- Message ----- From: Perlie Gold, PA-C Sent: 05/21/2023   7:03 AM EDT To: Quintella Reichert, MD; [facility] Triage Subject: hyperkalemia                                   FYI, patient with post clinic labs, K of 5.8. Fellow called overnight, left a voicemail for patient. Does not appear to be on any potassium sparing medications, possible hemolysis?  Perlie Gold, PA-C

## 2023-05-21 NOTE — Telephone Encounter (Signed)
 Patient Phone Call   Received page regarding critical lab result from [facility], specifically potassium level of 5.8.  Called patient overnight, however she did not answer the phone.  I left a voicemail informing her that she had an abnormal lab result that was dangerous, and that she should proceed to the emergency department if she was having concerning symptoms at all, specifically including lightheadedness, dizziness, fatigue, chest pain, shortness of breath.  Informed via voicemail that she should also call her doctor to order these results pursing the morning for further instructions.  Given K level less than 6.0, do not think she is to go to the emergency room overnight.   Achille Rich, MD Cardiology

## 2023-05-21 NOTE — Telephone Encounter (Signed)
 Called patient back about elevated potassium. Informed her that DOD, Dr. Izora Ribas, advised to get a repeat BMET as soon as possible. Patient stated she would not be able to have lab work done until Monday. Informed patient the importance of having it done today if possible. Patient stated she would try, but it will most likely be on Monday.

## 2023-05-21 NOTE — Telephone Encounter (Signed)
 Called patient's son who stated he cannot get patient to urgent care and he feels like patient is not having any symptoms, so he feels like it can wait until Monday. He stated he is going to wait to get BMET when they see A. FIB clinic on Monday.

## 2023-05-24 ENCOUNTER — Telehealth: Payer: Self-pay

## 2023-05-24 ENCOUNTER — Other Ambulatory Visit: Payer: Self-pay

## 2023-05-24 ENCOUNTER — Ambulatory Visit (HOSPITAL_COMMUNITY)
Admission: RE | Admit: 2023-05-24 | Discharge: 2023-05-24 | Disposition: A | Discharge status: Home / Self Care | Source: Ambulatory Visit | Attending: Internal Medicine | Admitting: Internal Medicine

## 2023-05-24 VITALS — BP 122/82 | HR 112 | Ht 65.0 in | Wt 184.8 lb

## 2023-05-24 DIAGNOSIS — Z79899 Other long term (current) drug therapy: Secondary | ICD-10-CM | POA: Diagnosis not present

## 2023-05-24 DIAGNOSIS — I509 Heart failure, unspecified: Secondary | ICD-10-CM

## 2023-05-24 DIAGNOSIS — Z86718 Personal history of other venous thrombosis and embolism: Secondary | ICD-10-CM | POA: Insufficient documentation

## 2023-05-24 DIAGNOSIS — Z85118 Personal history of other malignant neoplasm of bronchus and lung: Secondary | ICD-10-CM | POA: Insufficient documentation

## 2023-05-24 DIAGNOSIS — I110 Hypertensive heart disease with heart failure: Secondary | ICD-10-CM | POA: Diagnosis not present

## 2023-05-24 DIAGNOSIS — I4819 Other persistent atrial fibrillation: Secondary | ICD-10-CM

## 2023-05-24 DIAGNOSIS — I5032 Chronic diastolic (congestive) heart failure: Secondary | ICD-10-CM | POA: Diagnosis not present

## 2023-05-24 DIAGNOSIS — I2510 Atherosclerotic heart disease of native coronary artery without angina pectoris: Secondary | ICD-10-CM | POA: Insufficient documentation

## 2023-05-24 DIAGNOSIS — D6869 Other thrombophilia: Secondary | ICD-10-CM

## 2023-05-24 LAB — BASIC METABOLIC PANEL WITH GFR
Anion gap: 9 (ref 5–15)
BUN: 28 mg/dL — ABNORMAL HIGH (ref 8–23)
CO2: 28 mmol/L (ref 22–32)
Calcium: 9.7 mg/dL (ref 8.9–10.3)
Chloride: 104 mmol/L (ref 98–111)
Creatinine, Ser: 1.27 mg/dL — ABNORMAL HIGH (ref 0.44–1.00)
GFR, Estimated: 40 mL/min — ABNORMAL LOW (ref >60)
Glucose, Bld: 136 mg/dL — ABNORMAL HIGH (ref 70–99)
Potassium: 4.7 mmol/L (ref 3.5–5.1)
Sodium: 141 mmol/L (ref 135–145)

## 2023-05-24 MED ORDER — FUROSEMIDE 20 MG PO TABS: 20.0000 mg | ORAL_TABLET | Freq: Every day | ORAL | 6 refills | Status: DC

## 2023-05-24 NOTE — Telephone Encounter (Signed)
 Spoke with pt and son after receiving verbal consent from the pt. Pt had an appointment today 3/31 with afib clinic who gave the pt the same instructions Turner made in the result note. Pt aware to take Lasix 40 mg BID for 3 days and then 20 mg once daily after than. Pt had a BMET drawn today but is aware and agreeable to get another BMET on Friday 4/4. A BMET was ordered and released. Pt also agreeable to echocardiogram. An echo was ordered. Pt verbalized understanding. All questions, if any, were answered.

## 2023-05-24 NOTE — Addendum Note (Signed)
 Encounter addended by: Shona Simpson, RN on: 05/24/2023 3:39 PM  Actions taken: Pharmacy for encounter modified, Clinical Note Signed, New alternative orders accepted, Order list changed, Diagnosis association updated

## 2023-05-24 NOTE — Patient Instructions (Signed)
 Lasix -- take 40mg  twice a day for 3 days then reduce to 20mg  once a day going forward   Cardioversion scheduled for: Wednesday, April 2nd   - Arrive at the [facility] and go to admitting at 12:15pm   - Do not eat or drink anything after midnight the night prior to your procedure.   - Take all your morning medication (except diabetic medications) with a sip of water prior to arrival.  - You will not be able to drive home after your procedure.    - Do NOT miss any doses of your blood thinner - if you should miss a dose please notify our office immediately.   - If you feel as if you go back into normal rhythm prior to scheduled cardioversion, please notify our office immediately.   If your procedure is canceled in the cardioversion suite you will be charged a cancellation fee.

## 2023-05-24 NOTE — Telephone Encounter (Signed)
-----   Message from Armanda Magic sent at 05/23/2023  8:07 PM EDT ----- Also please repeat 2D echo for CHF

## 2023-05-24 NOTE — Progress Notes (Addendum)
 Primary Care Physician: Thana Ates, MD Referring Physician:Dr. Ryen Heitmeyer is a 89 y.o. female with a chronic diastolic heart failure, coronary artery atherosclerosis, HTN, h/o non small cell lung cancer with partial left lung resection, HTN that is in the afib clinic for f/u new onset seen by Dr. Pete Glatter.  He exchanged amlodipine for Cardizem and stopped hctz for lasix. She was previously on warfarin for "blood clots."   She notes her left lower extremity has chronic swelling > right extremity secondary to history of blood clots. CHA2DS2VASc score is at least 6.   On follow up 05/24/23, she is currently in Afib. Seen by Dr. Mayford Knife on 05/20/23 noted to be in Afib. Lopressor increased to 75 mg BID. Labs showed BNP of 7,378 and elevated potassium of 5.8. Review of lab notes show Dr. Mayford Knife last night commented to increase lasix to 40 mg BID x 3 days then down to 20 mg daily with plan to repeat Bmet today. She is on Eliquis 5 mg BID. No missed doses of Eliquis.   Today, she denies symptoms of palpitations, chest pain, positive for shortness of breath, negative for dizziness, presyncope, syncope, or neurologic sequela.     Past Medical History:  Diagnosis Date   Chronic diastolic CHF (congestive heart failure) (HCC)    Hypertension    Leg edema    Non-small cell lung cancer (NSCLC) (HCC) 2006   s/p surgery, no chemo/XRT needed   Paroxysmal A-fib [facility])    Past Surgical History:  Procedure Laterality Date   CARDIOVERSION N/A 11/06/2021   Procedure: CARDIOVERSION;  Surgeon: Christell Constant, MD;  Location: [facility] ENDOSCOPY;  Service: Cardiovascular;  Laterality: N/A;   HIP SURGERY     KNEE SURGERY     LUNG REMOVAL, PARTIAL     Left lower lobe removed    TEE WITHOUT CARDIOVERSION N/A 11/06/2021   Procedure: TRANSESOPHAGEAL ECHOCARDIOGRAM (TEE);  Surgeon: Christell Constant, MD;  Location: [facility] ENDOSCOPY;  Service: Cardiovascular;  Laterality: N/A;    Current Outpatient  Medications  Medication Sig Dispense Refill   acetaminophen (TYLENOL) 500 MG tablet Take 1,000 mg by mouth 2 (two) times daily.     apixaban (ELIQUIS) 5 MG TABS tablet Take 1 tablet (5 mg total) by mouth 2 (two) times daily. 180 tablet 3   calcium carbonate (OS-CAL) 600 MG TABS Take 600 mg by mouth every morning. With Vitamin D-     cholecalciferol (VITAMIN D) 1000 UNITS tablet Take 1,000 Units by mouth daily.     ferrous sulfate 324 (65 Fe) MG TBEC Take 1 tablet by mouth every morning.     furosemide (LASIX) 20 MG tablet Take 1 tablet (20 mg total) by mouth 3 (three) times a week. MONDAY, WEDNESDAY, FRIDAY 45 tablet 3   meclizine (ANTIVERT) 25 MG tablet Take 25 mg by mouth 2 (two) times daily as needed.     metoprolol tartrate (LOPRESSOR) 50 MG tablet Take 1.5 tablets (75 mg total) by mouth 2 (two) times daily. 270 tablet 3   Multiple Vitamin (MULTIVITAMIN WITH MINERALS) TABS Take 1 tablet by mouth daily.     psyllium (HYDROCIL/METAMUCIL) 95 % PACK Take 1 packet by mouth daily.     RABEprazole (ACIPHEX) 20 MG tablet Take 20 mg by mouth daily.     tretinoin (RETIN-A) 0.05 % cream Apply topically as needed.     UNABLE TO FIND Lift Chair 1 each 0   vitamin C (ASCORBIC ACID) 500 MG  tablet Take 500 mg by mouth daily.     No current facility-administered medications for this encounter.    Allergies  Allergen Reactions   Codeine Nausea And Vomiting   Vicodin [Hydrocodone-Acetaminophen] Other (See Comments)    Doesn't like how it makes her feel   ROS- All systems are reviewed and negative except as per the HPI above  Physical Exam: Vitals:   05/24/23 1420  BP: 122/82  Pulse: (!) 112  Weight: 83.8 kg  Height: 5\' 5"  (1.651 m)     Wt Readings from Last 3 Encounters:  05/24/23 83.8 kg  05/20/23 83 kg  12/02/22 85.8 kg    Labs: Lab Results  Component Value Date   NA 142 05/20/2023   K 5.8 (HH) 05/20/2023   CL 100 05/20/2023   CO2 27 05/20/2023   GLUCOSE 88 05/20/2023   BUN 26  05/20/2023   CREATININE 1.08 (H) 05/20/2023   CALCIUM 10.6 (H) 05/20/2023   Lab Results  Component Value Date   INR 1.7 (H) 11/17/2021   No results found for: "CHOL", "HDL", "LDLCALC", "TRIG"  GEN- The patient is well appearing, alert and oriented x 3 today.   Neck - no JVD or carotid bruit noted Lungs- Clear to ausculation bilaterally, normal work of breathing Heart- Irregular tachycardic rate and rhythm, no murmurs, rubs or gallops, PMI not laterally displaced Extremities- no clubbing, cyanosis, or edema Skin - no rash or ecchymosis noted   EKG- Vent. rate 112 BPM PR interval * ms QRS duration 84 ms QT/QTcB 310/423 ms P-R-T axes * 32 7 Atrial fibrillation with rapid ventricular response Septal infarct , age undetermined Abnormal ECG When compared with ECG of 20-May-2023 13:50, PREVIOUS ECG IS PRESENT   CHA2DS2-VASc Score = 6  The patient's score is based upon: CHF History: 1 HTN History: 1 Diabetes History: 0 Stroke History: 0 Vascular Disease History: 1 Age Score: 2 Gender Score: 1       ASSESSMENT AND PLAN: 1. Persistent Atrial Fibrillation (ICD10:  I48.19) The patient's CHA2DS2-VASc score is 6, indicating a 9.7% annual risk of stroke.    Patient is in Afib. Lopressor increased to 75 mg BID by Dr. Mayford Knife. We discussed the procedure cardioversion to try to convert to NSR. We discussed the risks vs benefits of this procedure and how ultimately we cannot predict whether a patient will have early return of arrhythmia post procedure. After discussion, the patient wishes to proceed with cardioversion. Repeat Bmet drawn today to check potassium. I did advise patient and family if potassium comes back higher than previous value will speak with cardiologist as to whether safe to proceed with cardioversion. CBC drawn 05/20/23.   Informed Consent   Shared Decision Making/Informed Consent The risks (stroke, cardiac arrhythmias rarely resulting in the need for a temporary or  permanent pacemaker, skin irritation or burns and complications associated with conscious sedation including aspiration, arrhythmia, respiratory failure and death), benefits (restoration of normal sinus rhythm) and alternatives of a direct current cardioversion were explained in detail to Ms. Felten and she agrees to proceed.      2. Secondary Hypercoagulable State (ICD10:  D68.69) The patient is at significant risk for stroke/thromboembolism based upon her CHA2DS2-VASc Score of 6.  Continue Eliquis 5 mg BID. No missed doses.   3. Chronic HFpEF She will increase lasix to 40 mg BID x 3 days then lasix 20 mg daily per Dr. Mayford Knife recommendations. Bmet today to recheck potassium.  4. HTN Stable today.  Follow up 2 weeks after DCCV.    Lake Bells, PA-C [facility] [address] [place], [place] [address] [phone]

## 2023-05-24 NOTE — H&P (View-Only) (Signed)
 Primary Care Physician: Thana Ates, MD Referring Physician:Dr. Ryen Heitmeyer is a 89 y.o. female with a chronic diastolic heart failure, coronary artery atherosclerosis, HTN, h/o non small cell lung cancer with partial left lung resection, HTN that is in the afib clinic for f/u new onset seen by Dr. Pete Glatter.  He exchanged amlodipine for Cardizem and stopped hctz for lasix. She was previously on warfarin for "blood clots."   She notes her left lower extremity has chronic swelling > right extremity secondary to history of blood clots. CHA2DS2VASc score is at least 6.   On follow up 05/24/23, she is currently in Afib. Seen by Dr. Mayford Knife on 05/20/23 noted to be in Afib. Lopressor increased to 75 mg BID. Labs showed BNP of 7,378 and elevated potassium of 5.8. Review of lab notes show Dr. Mayford Knife last night commented to increase lasix to 40 mg BID x 3 days then down to 20 mg daily with plan to repeat Bmet today. She is on Eliquis 5 mg BID. No missed doses of Eliquis.   Today, she denies symptoms of palpitations, chest pain, positive for shortness of breath, negative for dizziness, presyncope, syncope, or neurologic sequela.     Past Medical History:  Diagnosis Date   Chronic diastolic CHF (congestive heart failure) (HCC)    Hypertension    Leg edema    Non-small cell lung cancer (NSCLC) (HCC) 2006   s/p surgery, no chemo/XRT needed   Paroxysmal A-fib [facility])    Past Surgical History:  Procedure Laterality Date   CARDIOVERSION N/A 11/06/2021   Procedure: CARDIOVERSION;  Surgeon: Christell Constant, MD;  Location: [facility] ENDOSCOPY;  Service: Cardiovascular;  Laterality: N/A;   HIP SURGERY     KNEE SURGERY     LUNG REMOVAL, PARTIAL     Left lower lobe removed    TEE WITHOUT CARDIOVERSION N/A 11/06/2021   Procedure: TRANSESOPHAGEAL ECHOCARDIOGRAM (TEE);  Surgeon: Christell Constant, MD;  Location: [facility] ENDOSCOPY;  Service: Cardiovascular;  Laterality: N/A;    Current Outpatient  Medications  Medication Sig Dispense Refill   acetaminophen (TYLENOL) 500 MG tablet Take 1,000 mg by mouth 2 (two) times daily.     apixaban (ELIQUIS) 5 MG TABS tablet Take 1 tablet (5 mg total) by mouth 2 (two) times daily. 180 tablet 3   calcium carbonate (OS-CAL) 600 MG TABS Take 600 mg by mouth every morning. With Vitamin D-     cholecalciferol (VITAMIN D) 1000 UNITS tablet Take 1,000 Units by mouth daily.     ferrous sulfate 324 (65 Fe) MG TBEC Take 1 tablet by mouth every morning.     furosemide (LASIX) 20 MG tablet Take 1 tablet (20 mg total) by mouth 3 (three) times a week. MONDAY, WEDNESDAY, FRIDAY 45 tablet 3   meclizine (ANTIVERT) 25 MG tablet Take 25 mg by mouth 2 (two) times daily as needed.     metoprolol tartrate (LOPRESSOR) 50 MG tablet Take 1.5 tablets (75 mg total) by mouth 2 (two) times daily. 270 tablet 3   Multiple Vitamin (MULTIVITAMIN WITH MINERALS) TABS Take 1 tablet by mouth daily.     psyllium (HYDROCIL/METAMUCIL) 95 % PACK Take 1 packet by mouth daily.     RABEprazole (ACIPHEX) 20 MG tablet Take 20 mg by mouth daily.     tretinoin (RETIN-A) 0.05 % cream Apply topically as needed.     UNABLE TO FIND Lift Chair 1 each 0   vitamin C (ASCORBIC ACID) 500 MG  tablet Take 500 mg by mouth daily.     No current facility-administered medications for this encounter.    Allergies  Allergen Reactions   Codeine Nausea And Vomiting   Vicodin [Hydrocodone-Acetaminophen] Other (See Comments)    Doesn't like how it makes her feel   ROS- All systems are reviewed and negative except as per the HPI above  Physical Exam: Vitals:   05/24/23 1420  BP: 122/82  Pulse: (!) 112  Weight: 83.8 kg  Height: 5\' 5"  (1.651 m)     Wt Readings from Last 3 Encounters:  05/24/23 83.8 kg  05/20/23 83 kg  12/02/22 85.8 kg    Labs: Lab Results  Component Value Date   NA 142 05/20/2023   K 5.8 (HH) 05/20/2023   CL 100 05/20/2023   CO2 27 05/20/2023   GLUCOSE 88 05/20/2023   BUN 26  05/20/2023   CREATININE 1.08 (H) 05/20/2023   CALCIUM 10.6 (H) 05/20/2023   Lab Results  Component Value Date   INR 1.7 (H) 11/17/2021   No results found for: "CHOL", "HDL", "LDLCALC", "TRIG"  GEN- The patient is well appearing, alert and oriented x 3 today.   Neck - no JVD or carotid bruit noted Lungs- Clear to ausculation bilaterally, normal work of breathing Heart- Irregular tachycardic rate and rhythm, no murmurs, rubs or gallops, PMI not laterally displaced Extremities- no clubbing, cyanosis, or edema Skin - no rash or ecchymosis noted   EKG- Vent. rate 112 BPM PR interval * ms QRS duration 84 ms QT/QTcB 310/423 ms P-R-T axes * 32 7 Atrial fibrillation with rapid ventricular response Septal infarct , age undetermined Abnormal ECG When compared with ECG of 20-May-2023 13:50, PREVIOUS ECG IS PRESENT   CHA2DS2-VASc Score = 6  The patient's score is based upon: CHF History: 1 HTN History: 1 Diabetes History: 0 Stroke History: 0 Vascular Disease History: 1 Age Score: 2 Gender Score: 1       ASSESSMENT AND PLAN: 1. Persistent Atrial Fibrillation (ICD10:  I48.19) The patient's CHA2DS2-VASc score is 6, indicating a 9.7% annual risk of stroke.    Patient is in Afib. Lopressor increased to 75 mg BID by Dr. Mayford Knife. We discussed the procedure cardioversion to try to convert to NSR. We discussed the risks vs benefits of this procedure and how ultimately we cannot predict whether a patient will have early return of arrhythmia post procedure. After discussion, the patient wishes to proceed with cardioversion. Repeat Bmet drawn today to check potassium. I did advise patient and family if potassium comes back higher than previous value will speak with cardiologist as to whether safe to proceed with cardioversion. CBC drawn 05/20/23.   Informed Consent   Shared Decision Making/Informed Consent The risks (stroke, cardiac arrhythmias rarely resulting in the need for a temporary or  permanent pacemaker, skin irritation or burns and complications associated with conscious sedation including aspiration, arrhythmia, respiratory failure and death), benefits (restoration of normal sinus rhythm) and alternatives of a direct current cardioversion were explained in detail to Ms. Felten and she agrees to proceed.      2. Secondary Hypercoagulable State (ICD10:  D68.69) The patient is at significant risk for stroke/thromboembolism based upon her CHA2DS2-VASc Score of 6.  Continue Eliquis 5 mg BID. No missed doses.   3. Chronic HFpEF She will increase lasix to 40 mg BID x 3 days then lasix 20 mg daily per Dr. Mayford Knife recommendations. Bmet today to recheck potassium.  4. HTN Stable today.  Follow up 2 weeks after DCCV.    Lake Bells, PA-C [facility] [address] [place], [place] [address] [phone]

## 2023-05-25 NOTE — Progress Notes (Signed)
 Called patient with pre-procedure instructions for tomorrow.  Unable to reach patient. Generic voicemail set up, so this RN left general message.

## 2023-05-26 ENCOUNTER — Other Ambulatory Visit: Payer: Self-pay

## 2023-05-26 ENCOUNTER — Ambulatory Visit (HOSPITAL_COMMUNITY)
Admission: RE | Admit: 2023-05-26 | Discharge: 2023-05-26 | Disposition: A | Discharge status: Home / Self Care | Attending: Internal Medicine | Admitting: Internal Medicine

## 2023-05-26 ENCOUNTER — Ambulatory Visit (HOSPITAL_COMMUNITY): Admitting: Registered Nurse

## 2023-05-26 ENCOUNTER — Encounter (HOSPITAL_COMMUNITY): Payer: Self-pay | Admitting: Internal Medicine

## 2023-05-26 ENCOUNTER — Encounter (HOSPITAL_COMMUNITY)
Admission: RE | Disposition: A | Discharge status: Home / Self Care | Payer: Self-pay | Source: Home / Self Care | Attending: Internal Medicine

## 2023-05-26 DIAGNOSIS — Z7901 Long term (current) use of anticoagulants: Secondary | ICD-10-CM | POA: Diagnosis not present

## 2023-05-26 DIAGNOSIS — Z79899 Other long term (current) drug therapy: Secondary | ICD-10-CM | POA: Diagnosis not present

## 2023-05-26 DIAGNOSIS — I5032 Chronic diastolic (congestive) heart failure: Secondary | ICD-10-CM | POA: Diagnosis not present

## 2023-05-26 DIAGNOSIS — Z85118 Personal history of other malignant neoplasm of bronchus and lung: Secondary | ICD-10-CM | POA: Diagnosis not present

## 2023-05-26 DIAGNOSIS — I4891 Unspecified atrial fibrillation: Secondary | ICD-10-CM

## 2023-05-26 DIAGNOSIS — I4819 Other persistent atrial fibrillation: Secondary | ICD-10-CM | POA: Diagnosis not present

## 2023-05-26 DIAGNOSIS — I110 Hypertensive heart disease with heart failure: Secondary | ICD-10-CM | POA: Diagnosis not present

## 2023-05-26 DIAGNOSIS — I2510 Atherosclerotic heart disease of native coronary artery without angina pectoris: Secondary | ICD-10-CM | POA: Diagnosis not present

## 2023-05-26 DIAGNOSIS — D6869 Other thrombophilia: Secondary | ICD-10-CM | POA: Diagnosis not present

## 2023-05-26 HISTORY — PX: CARDIOVERSION: EP1203

## 2023-05-26 SURGERY — CARDIOVERSION (CATH LAB)
Anesthesia: General

## 2023-05-26 MED ORDER — SODIUM CHLORIDE 0.9% FLUSH: 3.0000 mL | Freq: Two times a day (BID) | INTRAVENOUS | Status: DC

## 2023-05-26 MED ORDER — PHENYLEPHRINE HCL (PRESSORS) 10 MG/ML IV SOLN
INTRAVENOUS | Status: DC | PRN
  Administered 2023-05-26: 120 ug via INTRAVENOUS

## 2023-05-26 MED ORDER — LIDOCAINE 2% (20 MG/ML) 5 ML SYRINGE
INTRAMUSCULAR | Status: DC | PRN
  Administered 2023-05-26: 60 mg via INTRAVENOUS

## 2023-05-26 MED ORDER — PROPOFOL 10 MG/ML IV BOLUS
INTRAVENOUS | Status: DC | PRN
  Administered 2023-05-26: 60 mg via INTRAVENOUS

## 2023-05-26 MED ORDER — SODIUM CHLORIDE 0.9% FLUSH: 3.0000 mL | INTRAVENOUS | Status: DC | PRN

## 2023-05-26 SURGICAL SUPPLY — 1 items: PAD DEFIB RADIO PHYSIO CONN (PAD) ×1 IMPLANT

## 2023-05-26 NOTE — Progress Notes (Signed)
 Pt incontinent of urine, adult depends in place, this RN offered to change depends for pt or give assistance, pt refused, states she will change when she gets home, pants are wet as well, blanket given to sit on

## 2023-05-26 NOTE — Anesthesia Preprocedure Evaluation (Addendum)
 Anesthesia Evaluation  Patient identified by MRN, date of birth, ID band Patient awake    Reviewed: Allergy & Precautions, NPO status , Patient's Chart, lab work & pertinent test results, reviewed documented beta blocker date and time   History of Anesthesia Complications Negative for: history of anesthetic complications  Airway Mallampati: II  TM Distance: >3 FB Neck ROM: Full    Dental  (+) Dental Advisory Given   Pulmonary  Non small cell lung    breath sounds clear to auscultation       Cardiovascular hypertension, Pt. on medications and Pt. on home beta blockers (-) angina + dysrhythmias Atrial Fibrillation  Rhythm:Irregular Rate:Normal  '23 ECHO: EF 50 to 55%.  1. The LV has low normal function.   2. RVF is normal. The right ventricular size is normal.   3. Left atrial size was severely dilated. No left atrial/left atrial appendage thrombus was detected. The LAA emptying velocity was 18 cm/s.   4. Right atrial size was severely dilated.   5. Suboptimal pulmonary vein Doppler; off axis imaging related to severe  bi-atrial enlargement. 2D MR Volume 27 mL, 3D Vena Contracta 0.38 cm2. The  mitral valve is normal in structure. Moderate mitral valve regurgitation.  No evidence of mitral stenosis.   6. Tricuspid valve regurgitation is moderate.   7. Atrial functional, central valve disease.   8. The aortic valve is tricuspid. There is mild calcification of the aortic valve. There is mild thickening of the aortic valve. Aortic valve regurgitation is not visualized. No aortic stenosis is present.     Neuro/Psych tremor    GI/Hepatic Neg liver ROS,GERD  Medicated and Controlled,,  Endo/Other  BMI 30.8  Renal/GU Renal InsufficiencyRenal disease     Musculoskeletal   Abdominal   Peds  Hematology eliquis   Anesthesia Other Findings   Reproductive/Obstetrics                              Anesthesia Physical Anesthesia Plan  ASA: 3  Anesthesia Plan: General   Post-op Pain Management: Minimal or no pain anticipated   Induction: Intravenous  PONV Risk Score and Plan: 3 and Treatment may vary due to age or medical condition  Airway Management Planned: Nasal Cannula and Natural Airway  Additional Equipment: None  Intra-op Plan:   Post-operative Plan:   Informed Consent: I have reviewed the patients History and Physical, chart, labs and discussed the procedure including the risks, benefits and alternatives for the proposed anesthesia with the patient or authorized representative who has indicated his/her understanding and acceptance.     Dental advisory given  Plan Discussed with: CRNA and Surgeon  Anesthesia Plan Comments:         Anesthesia Quick Evaluation

## 2023-05-26 NOTE — Transfer of Care (Signed)
 Immediate Anesthesia Transfer of Care Note  Patient: Alison Dunn  Procedure(s) Performed: CARDIOVERSION  Patient Location: Cath Lab  Anesthesia Type:General  Level of Consciousness: drowsy  Airway & Oxygen Therapy: Patient Spontanous Breathing and Patient connected to nasal cannula oxygen  Post-op Assessment: Report given to RN and Post -op Vital signs reviewed and stable  Post vital signs: Reviewed and stable  Last Vitals:  Vitals Value Taken Time  BP 107/82 (92) 05/26/23 1440  Temp    Pulse 65 05/26/23 1441  Resp 20 05/26/23 1441  SpO2 96 % 05/26/23 1441  Vitals shown include unfiled device data.  Last Pain:  Vitals:   05/26/23 1232  TempSrc: Temporal         Complications: No notable events documented.

## 2023-05-26 NOTE — CV Procedure (Signed)
    CARDIOVERSION NOTE  Procedure: Electrical Cardioversion Indications:  Atrial Fibrillation  Procedure Details:  Consent: Risks of procedure as well as the alternatives and risks of each were explained to the (patient/caregiver).  Consent for procedure obtained.  Time Out: Verified patient identification, verified procedure, site/side was marked, verified correct patient position, special equipment/implants available, medications/allergies/relevent history reviewed, required imaging and test results available.  Performed  Patient placed on cardiac monitor, pulse oximetry, supplemental oxygen as necessary.  Sedation given:  propofol per anesthesia Pacer pads placed anterior and posterior chest.  Cardioverted 2 time(s).  Cardioverted at 200J and 300J biphasic.  Impression: Findings: Post procedure EKG shows:  Sinus rhythm with atrial bigeminy Complications: None Patient did tolerate procedure well.  Plan: Successful DCCV to NSR with atrial bigeminy.   Time Spent Directly with the Patient:  30 minutes   Chrystie Nose, MD, [facility], FACP  [place]  [facility]  Medical Director of the [facility]  Cardiovascular Risk Reduction Clinic Diplomate of the American Board of Clinical Lipidology Attending Cardiologist  Direct Dial: [phone]  Fax: [phone]  Website:  [email or web address] 05/26/2023, 2:34 PM

## 2023-05-26 NOTE — Anesthesia Postprocedure Evaluation (Signed)
 Anesthesia Post Note  Patient: Alison Dunn  Procedure(s) Performed: CARDIOVERSION     Patient location during evaluation: Cath Lab Anesthesia Type: General Level of consciousness: awake and alert, patient cooperative and oriented Pain management: pain level controlled Vital Signs Assessment: post-procedure vital signs reviewed and stable Respiratory status: spontaneous breathing, nonlabored ventilation and respiratory function stable Cardiovascular status: blood pressure returned to baseline and stable Postop Assessment: no apparent nausea or vomiting Anesthetic complications: no   No notable events documented.  Last Vitals:  Vitals:   05/26/23 1515 05/26/23 1520  BP:    Pulse:    Resp: (!) 29 (!) 22  Temp:    SpO2:  99%    Last Pain:  Vitals:   05/26/23 1232  TempSrc: Temporal                 Jayd Cadieux,E. Joneric Streight

## 2023-05-26 NOTE — Interval H&P Note (Signed)
 History and Physical Interval Note:  05/26/2023 1:58 PM  Alison Dunn  has presented today for surgery, with the diagnosis of AFIB.  The various methods of treatment have been discussed with the patient and family. After consideration of risks, benefits and other options for treatment, the patient has consented to  Procedure(s): CARDIOVERSION (N/A) as a surgical intervention.  The patient's history has been reviewed, patient examined, no change in status, stable for surgery.  I have reviewed the patient's chart and labs.  Questions were answered to the patient's satisfaction.     Chrystie Nose

## 2023-05-28 DIAGNOSIS — Z79899 Other long term (current) drug therapy: Secondary | ICD-10-CM | POA: Diagnosis not present

## 2023-05-29 LAB — BASIC METABOLIC PANEL WITH GFR
BUN/Creatinine Ratio: 21 (ref 12–28)
BUN: 33 mg/dL — ABNORMAL HIGH (ref 8–27)
CO2: 29 mmol/L (ref 20–29)
Calcium: 9.3 mg/dL (ref 8.7–10.3)
Chloride: 97 mmol/L (ref 96–106)
Creatinine, Ser: 1.57 mg/dL — ABNORMAL HIGH (ref 0.57–1.00)
Glucose: 124 mg/dL — ABNORMAL HIGH (ref 70–99)
Potassium: 4.2 mmol/L (ref 3.5–5.2)
Sodium: 142 mmol/L (ref 134–144)
eGFR: 31 mL/min/{1.73_m2} — ABNORMAL LOW (ref >59)

## 2023-05-31 ENCOUNTER — Telehealth: Payer: Self-pay

## 2023-05-31 DIAGNOSIS — I509 Heart failure, unspecified: Secondary | ICD-10-CM | POA: Diagnosis not present

## 2023-05-31 DIAGNOSIS — I10 Essential (primary) hypertension: Secondary | ICD-10-CM

## 2023-05-31 DIAGNOSIS — Z79899 Other long term (current) drug therapy: Secondary | ICD-10-CM

## 2023-05-31 NOTE — Telephone Encounter (Signed)
-----   Message from Armanda Magic sent at 05/30/2023  8:42 AM EDT ----- Scr bumped due to increase Lasix for several days.  Please have patient come in for repeat BMET and BNP MOnday 4/7

## 2023-05-31 NOTE — Telephone Encounter (Signed)
 Spoke with pt's son and relayed that his mother needs to have labs drawn per Dr. Mayford Knife. Son said he would take her to [facility] this morning. A BMET and BNP were ordered and released. Son verbalized understanding. All questions, if any, were answered.

## 2023-06-01 LAB — BASIC METABOLIC PANEL WITH GFR
BUN/Creatinine Ratio: 28 (ref 12–28)
BUN: 36 mg/dL — ABNORMAL HIGH (ref 8–27)
CO2: 25 mmol/L (ref 20–29)
Calcium: 10.1 mg/dL (ref 8.7–10.3)
Chloride: 96 mmol/L (ref 96–106)
Creatinine, Ser: 1.28 mg/dL — ABNORMAL HIGH (ref 0.57–1.00)
Glucose: 181 mg/dL — ABNORMAL HIGH (ref 70–99)
Potassium: 4.5 mmol/L (ref 3.5–5.2)
Sodium: 141 mmol/L (ref 134–144)
eGFR: 40 mL/min/{1.73_m2} — ABNORMAL LOW (ref >59)

## 2023-06-01 LAB — PRO B NATRIURETIC PEPTIDE: NT-Pro BNP: 6022 pg/mL — ABNORMAL HIGH (ref 0–738)

## 2023-06-02 ENCOUNTER — Inpatient Hospital Stay (HOSPITAL_COMMUNITY): Admitting: Certified Registered Nurse Anesthetist

## 2023-06-02 ENCOUNTER — Other Ambulatory Visit: Payer: Self-pay

## 2023-06-02 ENCOUNTER — Inpatient Hospital Stay (HOSPITAL_COMMUNITY)

## 2023-06-02 ENCOUNTER — Inpatient Hospital Stay (HOSPITAL_COMMUNITY)
Admission: EM | Admit: 2023-06-02 | Discharge: 2023-06-24 | DRG: 291 | Disposition: E | Attending: Internal Medicine | Admitting: Internal Medicine

## 2023-06-02 ENCOUNTER — Encounter (HOSPITAL_COMMUNITY): Payer: Self-pay | Admitting: Emergency Medicine

## 2023-06-02 ENCOUNTER — Emergency Department (HOSPITAL_COMMUNITY)

## 2023-06-02 ENCOUNTER — Telehealth: Payer: Self-pay | Admitting: Cardiology

## 2023-06-02 DIAGNOSIS — N1832 Chronic kidney disease, stage 3b: Secondary | ICD-10-CM | POA: Diagnosis not present

## 2023-06-02 DIAGNOSIS — I499 Cardiac arrhythmia, unspecified: Secondary | ICD-10-CM | POA: Diagnosis not present

## 2023-06-02 DIAGNOSIS — I700 Atherosclerosis of aorta: Secondary | ICD-10-CM | POA: Diagnosis present

## 2023-06-02 DIAGNOSIS — I872 Venous insufficiency (chronic) (peripheral): Secondary | ICD-10-CM | POA: Diagnosis present

## 2023-06-02 DIAGNOSIS — E669 Obesity, unspecified: Secondary | ICD-10-CM | POA: Diagnosis present

## 2023-06-02 DIAGNOSIS — I2510 Atherosclerotic heart disease of native coronary artery without angina pectoris: Secondary | ICD-10-CM | POA: Diagnosis present

## 2023-06-02 DIAGNOSIS — Z79899 Other long term (current) drug therapy: Secondary | ICD-10-CM

## 2023-06-02 DIAGNOSIS — R918 Other nonspecific abnormal finding of lung field: Secondary | ICD-10-CM | POA: Diagnosis not present

## 2023-06-02 DIAGNOSIS — I5033 Acute on chronic diastolic (congestive) heart failure: Secondary | ICD-10-CM

## 2023-06-02 DIAGNOSIS — I509 Heart failure, unspecified: Principal | ICD-10-CM

## 2023-06-02 DIAGNOSIS — G9340 Encephalopathy, unspecified: Secondary | ICD-10-CM | POA: Diagnosis not present

## 2023-06-02 DIAGNOSIS — E875 Hyperkalemia: Secondary | ICD-10-CM | POA: Diagnosis present

## 2023-06-02 DIAGNOSIS — Z6831 Body mass index (BMI) 31.0-31.9, adult: Secondary | ICD-10-CM

## 2023-06-02 DIAGNOSIS — Z902 Acquired absence of lung [part of]: Secondary | ICD-10-CM

## 2023-06-02 DIAGNOSIS — I480 Paroxysmal atrial fibrillation: Secondary | ICD-10-CM | POA: Diagnosis not present

## 2023-06-02 DIAGNOSIS — Z85118 Personal history of other malignant neoplasm of bronchus and lung: Secondary | ICD-10-CM | POA: Diagnosis not present

## 2023-06-02 DIAGNOSIS — Z515 Encounter for palliative care: Secondary | ICD-10-CM

## 2023-06-02 DIAGNOSIS — I4819 Other persistent atrial fibrillation: Secondary | ICD-10-CM

## 2023-06-02 DIAGNOSIS — G250 Essential tremor: Secondary | ICD-10-CM | POA: Diagnosis present

## 2023-06-02 DIAGNOSIS — J9600 Acute respiratory failure, unspecified whether with hypoxia or hypercapnia: Secondary | ICD-10-CM

## 2023-06-02 DIAGNOSIS — R54 Age-related physical debility: Secondary | ICD-10-CM | POA: Diagnosis present

## 2023-06-02 DIAGNOSIS — Z7901 Long term (current) use of anticoagulants: Secondary | ICD-10-CM

## 2023-06-02 DIAGNOSIS — I5043 Acute on chronic combined systolic (congestive) and diastolic (congestive) heart failure: Secondary | ICD-10-CM | POA: Diagnosis present

## 2023-06-02 DIAGNOSIS — I959 Hypotension, unspecified: Secondary | ICD-10-CM | POA: Diagnosis present

## 2023-06-02 DIAGNOSIS — J90 Pleural effusion, not elsewhere classified: Secondary | ICD-10-CM

## 2023-06-02 DIAGNOSIS — Z885 Allergy status to narcotic agent status: Secondary | ICD-10-CM | POA: Diagnosis not present

## 2023-06-02 DIAGNOSIS — E872 Acidosis, unspecified: Secondary | ICD-10-CM | POA: Diagnosis present

## 2023-06-02 DIAGNOSIS — R600 Localized edema: Secondary | ICD-10-CM | POA: Diagnosis present

## 2023-06-02 DIAGNOSIS — D72829 Elevated white blood cell count, unspecified: Secondary | ICD-10-CM | POA: Diagnosis not present

## 2023-06-02 DIAGNOSIS — F419 Anxiety disorder, unspecified: Secondary | ICD-10-CM | POA: Diagnosis present

## 2023-06-02 DIAGNOSIS — E785 Hyperlipidemia, unspecified: Secondary | ICD-10-CM | POA: Diagnosis present

## 2023-06-02 DIAGNOSIS — I4510 Unspecified right bundle-branch block: Secondary | ICD-10-CM | POA: Diagnosis present

## 2023-06-02 DIAGNOSIS — Z4682 Encounter for fitting and adjustment of non-vascular catheter: Secondary | ICD-10-CM | POA: Diagnosis not present

## 2023-06-02 DIAGNOSIS — C3492 Malignant neoplasm of unspecified part of left bronchus or lung: Secondary | ICD-10-CM | POA: Diagnosis not present

## 2023-06-02 DIAGNOSIS — I4891 Unspecified atrial fibrillation: Secondary | ICD-10-CM

## 2023-06-02 DIAGNOSIS — Z66 Do not resuscitate: Secondary | ICD-10-CM | POA: Diagnosis present

## 2023-06-02 DIAGNOSIS — Z83511 Family history of glaucoma: Secondary | ICD-10-CM

## 2023-06-02 DIAGNOSIS — R0902 Hypoxemia: Secondary | ICD-10-CM | POA: Diagnosis not present

## 2023-06-02 DIAGNOSIS — R000 Tachycardia, unspecified: Secondary | ICD-10-CM | POA: Diagnosis not present

## 2023-06-02 DIAGNOSIS — M7989 Other specified soft tissue disorders: Secondary | ICD-10-CM | POA: Diagnosis present

## 2023-06-02 DIAGNOSIS — J939 Pneumothorax, unspecified: Secondary | ICD-10-CM | POA: Diagnosis not present

## 2023-06-02 DIAGNOSIS — I130 Hypertensive heart and chronic kidney disease with heart failure and stage 1 through stage 4 chronic kidney disease, or unspecified chronic kidney disease: Principal | ICD-10-CM | POA: Diagnosis present

## 2023-06-02 DIAGNOSIS — I110 Hypertensive heart disease with heart failure: Secondary | ICD-10-CM | POA: Diagnosis not present

## 2023-06-02 DIAGNOSIS — I469 Cardiac arrest, cause unspecified: Secondary | ICD-10-CM

## 2023-06-02 DIAGNOSIS — R079 Chest pain, unspecified: Secondary | ICD-10-CM | POA: Diagnosis not present

## 2023-06-02 DIAGNOSIS — R0602 Shortness of breath: Secondary | ICD-10-CM | POA: Diagnosis not present

## 2023-06-02 DIAGNOSIS — I462 Cardiac arrest due to underlying cardiac condition: Secondary | ICD-10-CM | POA: Diagnosis not present

## 2023-06-02 LAB — COMPREHENSIVE METABOLIC PANEL WITH GFR
ALT: 25 U/L (ref 0–44)
AST: 44 U/L — ABNORMAL HIGH (ref 15–41)
Albumin: 4.1 g/dL (ref 3.5–5.0)
Alkaline Phosphatase: 47 U/L (ref 38–126)
Anion gap: 12 (ref 5–15)
BUN: 40 mg/dL — ABNORMAL HIGH (ref 8–23)
CO2: 26 mmol/L (ref 22–32)
Calcium: 9.6 mg/dL (ref 8.9–10.3)
Chloride: 98 mmol/L (ref 98–111)
Creatinine, Ser: 1.42 mg/dL — ABNORMAL HIGH (ref 0.44–1.00)
GFR, Estimated: 35 mL/min — ABNORMAL LOW (ref >60)
Glucose, Bld: 142 mg/dL — ABNORMAL HIGH (ref 70–99)
Potassium: 4.7 mmol/L (ref 3.5–5.1)
Sodium: 136 mmol/L (ref 135–145)
Total Bilirubin: 1.3 mg/dL — ABNORMAL HIGH (ref 0.0–1.2)
Total Protein: 6.7 g/dL (ref 6.5–8.1)

## 2023-06-02 LAB — CBC WITH DIFFERENTIAL/PLATELET
Abs Immature Granulocytes: 0.09 10*3/uL — ABNORMAL HIGH (ref 0.00–0.07)
Basophils Absolute: 0 10*3/uL (ref 0.0–0.1)
Basophils Relative: 0 %
Eosinophils Absolute: 0 10*3/uL (ref 0.0–0.5)
Eosinophils Relative: 0 %
HCT: 49.4 % — ABNORMAL HIGH (ref 36.0–46.0)
Hemoglobin: 15.7 g/dL — ABNORMAL HIGH (ref 12.0–15.0)
Immature Granulocytes: 1 %
Lymphocytes Relative: 13 %
Lymphs Abs: 2.3 10*3/uL (ref 0.7–4.0)
MCH: 31.3 pg (ref 26.0–34.0)
MCHC: 31.8 g/dL (ref 30.0–36.0)
MCV: 98.4 fL (ref 80.0–100.0)
Monocytes Absolute: 1.3 10*3/uL — ABNORMAL HIGH (ref 0.1–1.0)
Monocytes Relative: 7 %
Neutro Abs: 14.3 10*3/uL — ABNORMAL HIGH (ref 1.7–7.7)
Neutrophils Relative %: 79 %
Platelets: 218 10*3/uL (ref 150–400)
RBC: 5.02 MIL/uL (ref 3.87–5.11)
RDW: 14.6 % (ref 11.5–15.5)
WBC: 18.1 10*3/uL — ABNORMAL HIGH (ref 4.0–10.5)
nRBC: 0 % (ref 0.0–0.2)

## 2023-06-02 LAB — ECHOCARDIOGRAM COMPLETE
MV M vel: 4.2 m/s
MV Peak grad: 70.6 mmHg
Radius: 0.6 cm
S' Lateral: 4.3 cm
Weight: 2998.26 [oz_av]

## 2023-06-02 LAB — BASIC METABOLIC PANEL WITH GFR
Anion gap: <0 — ABNORMAL LOW (ref 5–15)
BUN: 40 mg/dL — ABNORMAL HIGH (ref 8–23)
CO2: 22 mmol/L (ref 22–32)
Calcium: >15 mg/dL (ref 8.9–10.3)
Chloride: 126 mmol/L — ABNORMAL HIGH (ref 98–111)
Creatinine, Ser: 1.8 mg/dL — ABNORMAL HIGH (ref 0.44–1.00)
GFR, Estimated: 27 mL/min — ABNORMAL LOW (ref >60)
Glucose, Bld: 110 mg/dL — ABNORMAL HIGH (ref 70–99)
Potassium: 5.3 mmol/L — ABNORMAL HIGH (ref 3.5–5.1)
Sodium: 141 mmol/L (ref 135–145)

## 2023-06-02 LAB — LIPASE, BLOOD: Lipase: 24 U/L (ref 11–51)

## 2023-06-02 LAB — TROPONIN I (HIGH SENSITIVITY): Troponin I (High Sensitivity): 17 ng/L (ref <18)

## 2023-06-02 LAB — BRAIN NATRIURETIC PEPTIDE: B Natriuretic Peptide: 778.8 pg/mL — ABNORMAL HIGH (ref 0.0–100.0)

## 2023-06-02 LAB — MAGNESIUM: Magnesium: 1.9 mg/dL (ref 1.7–2.4)

## 2023-06-02 LAB — BLOOD GAS, VENOUS

## 2023-06-02 LAB — GLUCOSE, CAPILLARY: Glucose-Capillary: 105 mg/dL — ABNORMAL HIGH (ref 70–99)

## 2023-06-02 MED ORDER — METOPROLOL TARTRATE 25 MG PO TABS
50.0000 mg | ORAL_TABLET | Freq: Once | ORAL | Status: AC
  Administered 2023-06-02: 50 mg via ORAL
  Filled 2023-06-02: qty 2

## 2023-06-02 MED ORDER — METOPROLOL SUCCINATE ER 25 MG PO TB24
25.0000 mg | ORAL_TABLET | Freq: Once | ORAL | Status: AC
  Administered 2023-06-02: 25 mg via ORAL
  Filled 2023-06-02: qty 1

## 2023-06-02 MED ORDER — MELATONIN 3 MG PO TABS: 3.0000 mg | ORAL_TABLET | Freq: Every evening | ORAL | Status: DC | PRN

## 2023-06-02 MED ORDER — ALPRAZOLAM 0.5 MG PO TABS
0.5000 mg | ORAL_TABLET | Freq: Three times a day (TID) | ORAL | Status: DC | PRN
  Administered 2023-06-02: 0.5 mg via ORAL
  Filled 2023-06-02: qty 1

## 2023-06-02 MED ORDER — ORAL CARE MOUTH RINSE: 15.0000 mL | OROMUCOSAL | Status: DC | PRN

## 2023-06-02 MED ORDER — METOPROLOL TARTRATE 5 MG/5ML IV SOLN
5.0000 mg | Freq: Once | INTRAVENOUS | Status: AC
  Administered 2023-06-02: 5 mg via INTRAVENOUS
  Filled 2023-06-02: qty 5

## 2023-06-02 MED ORDER — METOPROLOL SUCCINATE ER 100 MG PO TB24
100.0000 mg | ORAL_TABLET | Freq: Two times a day (BID) | ORAL | Status: DC
  Filled 2023-06-02: qty 1

## 2023-06-02 MED ORDER — CALCIUM CARBONATE 1250 (500 CA) MG PO TABS
1.0000 | ORAL_TABLET | Freq: Every day | ORAL | Status: DC
Start: 2023-06-03 — End: 2023-06-03
  Filled 2023-06-02: qty 1

## 2023-06-02 MED ORDER — AMIODARONE HCL IN DEXTROSE 360-4.14 MG/200ML-% IV SOLN
60.0000 mg/h | INTRAVENOUS | Status: AC
  Administered 2023-06-02 (×2): 60 mg/h via INTRAVENOUS
  Filled 2023-06-02 (×2): qty 200

## 2023-06-02 MED ORDER — AMIODARONE LOAD VIA INFUSION
150.0000 mg | Freq: Once | INTRAVENOUS | Status: AC
  Administered 2023-06-02: 150 mg via INTRAVENOUS
  Filled 2023-06-02: qty 83.34

## 2023-06-02 MED ORDER — ORAL CARE MOUTH RINSE: 15.0000 mL | OROMUCOSAL | Status: DC

## 2023-06-02 MED ORDER — ACETAMINOPHEN 325 MG PO TABS: 650.0000 mg | ORAL_TABLET | Freq: Four times a day (QID) | ORAL | Status: DC | PRN

## 2023-06-02 MED ORDER — VITAMIN D 25 MCG (1000 UNIT) PO TABS
1000.0000 [IU] | ORAL_TABLET | Freq: Every day | ORAL | Status: DC
  Administered 2023-06-02: 1000 [IU] via ORAL
  Filled 2023-06-02: qty 1

## 2023-06-02 MED ORDER — APIXABAN 5 MG PO TABS
5.0000 mg | ORAL_TABLET | Freq: Two times a day (BID) | ORAL | Status: DC
  Administered 2023-06-02: 5 mg via ORAL
  Filled 2023-06-02: qty 1

## 2023-06-02 MED ORDER — METOPROLOL TARTRATE 5 MG/5ML IV SOLN
5.0000 mg | Freq: Once | INTRAVENOUS | Status: AC
Start: 2023-06-02 — End: 2023-06-02
  Administered 2023-06-02: 5 mg via INTRAVENOUS
  Filled 2023-06-02: qty 5

## 2023-06-02 MED ORDER — ORAL CARE MOUTH RINSE
15.0000 mL | OROMUCOSAL | Status: DC
Start: 2023-06-03 — End: 2023-06-03

## 2023-06-02 MED ORDER — SODIUM BICARBONATE 8.4 % IV SOLN
INTRAVENOUS | Status: AC
  Filled 2023-06-02: qty 100

## 2023-06-02 MED ORDER — SODIUM CHLORIDE 0.9 % IV SOLN
250.0000 mL | INTRAVENOUS | Status: DC
  Administered 2023-06-02: 250 mL via INTRAVENOUS

## 2023-06-02 MED ORDER — SODIUM BICARBONATE 8.4 % IV SOLN
100.0000 meq | Freq: Once | INTRAVENOUS | Status: AC
  Administered 2023-06-02: 100 meq via INTRAVENOUS

## 2023-06-02 MED ORDER — ROSUVASTATIN CALCIUM 20 MG PO TABS
10.0000 mg | ORAL_TABLET | Freq: Every day | ORAL | Status: DC
  Filled 2023-06-02: qty 2

## 2023-06-02 MED ORDER — FUROSEMIDE 10 MG/ML IJ SOLN
40.0000 mg | Freq: Once | INTRAMUSCULAR | Status: AC
  Administered 2023-06-02: 40 mg via INTRAVENOUS
  Filled 2023-06-02: qty 4

## 2023-06-02 MED ORDER — ONDANSETRON HCL 4 MG/2ML IJ SOLN
4.0000 mg | Freq: Four times a day (QID) | INTRAMUSCULAR | Status: DC | PRN
  Administered 2023-06-02: 4 mg via INTRAVENOUS
  Filled 2023-06-02: qty 2

## 2023-06-02 MED ORDER — VITAMIN C 500 MG PO TABS
500.0000 mg | ORAL_TABLET | Freq: Every day | ORAL | Status: DC
  Administered 2023-06-02: 500 mg via ORAL
  Filled 2023-06-02: qty 1

## 2023-06-02 MED ORDER — METOPROLOL TARTRATE 5 MG/5ML IV SOLN
5.0000 mg | INTRAVENOUS | Status: DC | PRN
  Administered 2023-06-02: 5 mg via INTRAVENOUS
  Filled 2023-06-02: qty 5

## 2023-06-02 MED ORDER — SODIUM CHLORIDE 0.9 % IV BOLUS: 1000.0000 mL | Freq: Once | INTRAVENOUS | Status: DC

## 2023-06-02 MED ORDER — CALCIUM CARBONATE 600 MG PO TABS: 600.0000 mg | ORAL_TABLET | Freq: Every morning | ORAL | Status: DC

## 2023-06-02 MED ORDER — AMIODARONE HCL IN DEXTROSE 360-4.14 MG/200ML-% IV SOLN
30.0000 mg/h | INTRAVENOUS | Status: DC
  Administered 2023-06-02: 30 mg/h via INTRAVENOUS

## 2023-06-02 MED ORDER — PSYLLIUM 95 % PO PACK: 1.0000 | PACK | Freq: Every evening | ORAL | Status: DC

## 2023-06-02 MED ORDER — NOREPINEPHRINE 4 MG/250ML-% IV SOLN
2.0000 ug/min | INTRAVENOUS | Status: DC
  Administered 2023-06-02: 2 ug/min via INTRAVENOUS
  Filled 2023-06-02: qty 250

## 2023-06-02 MED ORDER — ALUM & MAG HYDROXIDE-SIMETH 200-200-20 MG/5ML PO SUSP
30.0000 mL | Freq: Once | ORAL | Status: AC
  Administered 2023-06-02: 30 mL via ORAL
  Filled 2023-06-02: qty 30

## 2023-06-02 MED ORDER — CHLORHEXIDINE GLUCONATE CLOTH 2 % EX PADS: 6.0000 | MEDICATED_PAD | Freq: Every day | CUTANEOUS | Status: DC

## 2023-06-02 MED ORDER — FAMOTIDINE 20 MG PO TABS: 20.0000 mg | ORAL_TABLET | Freq: Every day | ORAL | Status: DC

## 2023-06-02 MED ORDER — METOPROLOL SUCCINATE ER 25 MG PO TB24
75.0000 mg | ORAL_TABLET | Freq: Two times a day (BID) | ORAL | Status: DC
  Administered 2023-06-02: 75 mg via ORAL
  Filled 2023-06-02: qty 3

## 2023-06-02 MED ORDER — FUROSEMIDE 10 MG/ML IJ SOLN
40.0000 mg | Freq: Two times a day (BID) | INTRAMUSCULAR | Status: DC
  Administered 2023-06-02 (×2): 40 mg via INTRAVENOUS
  Filled 2023-06-02 (×2): qty 4

## 2023-06-02 MED ORDER — PANTOPRAZOLE SODIUM 40 MG PO TBEC
40.0000 mg | DELAYED_RELEASE_TABLET | Freq: Every day | ORAL | Status: DC
  Administered 2023-06-02: 40 mg via ORAL
  Filled 2023-06-02: qty 1

## 2023-06-02 MED ORDER — ACETAMINOPHEN 650 MG RE SUPP: 650.0000 mg | Freq: Four times a day (QID) | RECTAL | Status: DC | PRN

## 2023-06-03 ENCOUNTER — Encounter (HOSPITAL_COMMUNITY): Payer: Self-pay

## 2023-06-03 ENCOUNTER — Ambulatory Visit (HOSPITAL_COMMUNITY): Admitting: Internal Medicine

## 2023-06-03 DIAGNOSIS — I5021 Acute systolic (congestive) heart failure: Secondary | ICD-10-CM

## 2023-06-03 DIAGNOSIS — I4891 Unspecified atrial fibrillation: Secondary | ICD-10-CM

## 2023-06-03 LAB — BLOOD GAS, VENOUS
Bicarbonate: 24.5 mmol/L — ABNORMAL HIGH (ref 20.0–28.0)
O2 Saturation: 32.3 mmol/L — ABNORMAL HIGH (ref 0.0–2.0)
Patient temperature: 36.1
Patient temperature: 36.1 %
pH, Ven: <120 mmHg — CL (ref 7.25–7.43)
pO2, Ven: 120 mmHg (ref 44–60)
pO2, Ven: 35 mmol/L (ref 32–45)

## 2023-06-03 NOTE — Discharge Summary (Signed)
    DEATH SUMMARY   Patient Details  Name: Alison Dunn MRN: [id number] DOB: 1931-07-31 WUJ:[id number], Alison Singh, MD  Admission/Discharge Information   Admit Date:  06-22-2023  Date of Death: Date of Death: 22-Jun-2023  Time of Death: Time of Death: 06/26/00  Length of Stay: 1   Principle Cause of death: cardiac arrest of unclear etiology   Final Diagnoses:  Atrial fibrillation status post failed cardioversion with with acute RVR Acute systolic congestive heart failure Chronic diastolic congestive heart failure HTN Chronic venous insufficiency with bilateral lower extremity edema Remote history of non-small cell lung cancer status post resection left lower lobe 2004-06-26 Obesity - Body mass index is 31.18 kg/m.   History of present illness:  Alison Dunn was a very pleasant 89 year old with a history noted above who presented to the ER via EMS in the early morning hours 06-22-23 with reports of chest pain.  At presentation she was found to be in atrial fibrillation with RVR.  Her chest pain spontaneously resolved.  Due to ongoing difficulty with rate control the patient was admitted to the hospital.  Cardiology was consulted as well as Electrophysiology.  Hospital Course:  The initial portion of the patient's hospital stay was largely unremarkable.  After discussion with EP the patient agreed to a trial of amiodarone for heart rate control and this was initiated.  The plan was to continue IV amiodarone and then proceed with cardioversion on 06/04/2023 if the patient remained in atrial fibrillation.  As part of her workup the patient underwent a TTE which revealed a new decreased ejection fraction of 40-45% with global hypokinesis.  This was felt to likely represent the effects of uncontrolled RVR/persistent tachycardia.  Despite this the patient was not clinically in extremis.  At the time of a follow-up visit at 3:30 PM Jun 22, 2023 the patient was alert conversant and quite pleasant.  Overall she reported that she was  feeling somewhat better at that time.  As the night went on she had some mild anxiety, but otherwise, apart from some tachycardia, her vitals remained stable and she was not in distress.  At approximately 9:30 PM 06/22/2023 the patient was noted to be markedly hypotensive and became unresponsive.  CODE BLUE was called.  Her initial rhythm was asystole.  Approximately 20 minutes of CPR was performed including intubation and epinephrine.  The patient did not require cardioversion.  She was transferred acutely to the ICU and PCCM assumed her care.  The exact cause of her cardiac arrest was not clear but it was felt to likely be cardiac in nature.  She required Levophed infusion in the the ICU.  At approximately 10:45 PM, while in the ICU, the patient suffered another cardiac arrest.  The PCCM attending discussed her case with the patient's family who confirmed that she would not desire to undergo prolonged life support and the decision was made to transition to comfort focused care.  The patient died at 23:02 2023-06-22 under comfort focused care.  Her cause of death was cardiac arrest of unclear underlying etiology but likely primarily cardiac in nature.   Signed:  Lonia Blood  [facility] 06/23/2023, 6:33 PM

## 2023-06-04 ENCOUNTER — Encounter (HOSPITAL_COMMUNITY): Admission: EM | Payer: Self-pay | Source: Home / Self Care | Attending: Nurse Practitioner

## 2023-06-04 SURGERY — CARDIOVERSION (CATH LAB)
Anesthesia: General

## 2023-06-05 NOTE — Telephone Encounter (Signed)
 Late call received to do consult in the morning by PCCP Dr. Villa Greaser. Message to team sent

## 2023-06-22 ENCOUNTER — Other Ambulatory Visit (HOSPITAL_COMMUNITY)

## 2023-06-24 NOTE — ED Notes (Signed)
 Pt began dry heaving after being helped to the bedside commode.  Of note, Pt have not eaten since she has been here.  Pt refused lunch.  Also, Pt has been helped to bedside commode several times previously w/o issue.

## 2023-06-24 NOTE — Progress Notes (Addendum)
   Consult from early this morning reviewed -- agree with findings. I briefly saw her at bedside today and noted she was in afib with RVR. She reported feeling better, less short of breath. Patient known to me from recent outpatient cardioversion which was successful, however, she has moderate to severe biatrial enlargement and bigeminal PAC's noted after cardioversion - initially felt better back in sinus, but now back in afib with RVR - declines amiodarone due to concerns about side effects. Will certainly need AAD therapy to achieve/maintain sinus rhythm.  Will ask cardiac EP to evaluate today and provide management recommendations.  Chrystie Nose, MD, [facility], FACP  [place]  [facility]  Medical Director of the Advanced Lipid Disorders &  Cardiovascular Risk Reduction Clinic Diplomate of the American Board of Clinical Lipidology Attending Cardiologist  Direct Dial: [phone]  Fax: [phone]  Website:  [email or web address]

## 2023-06-24 NOTE — ED Notes (Signed)
 Hospitalist at bedside

## 2023-06-24 NOTE — Progress Notes (Addendum)
 eLink Physician-Brief Progress Note Patient Name: Alison Dunn DOB: 1931/02/24 MRN: [id number]   Date of Service  06/22/2023  HPI/Events of Note  Patient admitted with atrial fibrillation with RVR and severe metabolic acidosis, she went into cardiac arrest in the ED and had ROSC after 13 minutes, followed by recurrence on arrival to the ICU. Dr. Vassie Loll spoke with family who decided to transition to comfort measures.  eICU Interventions  New Patient Evaluation.        Alison Dunn [name] 06/05/2023, 10:59 PM

## 2023-06-24 NOTE — Consult Note (Signed)
 Cardiology Consultation:   Patient ID: Alison Dunn MRN: [id number]; DOB: 1931-10-12  Admit date: 05/27/2023 Date of Consult: 06/21/2023  Primary Care Provider: Thana Ates, MD [facility] Cardiologist: Armanda Magic, MD  [facility] Electrophysiologist:  None    Patient Profile:   Alison Dunn is a 89 y.o. female with a hx of chronic diastolic heart failure, coronary artery atherosclerosis, HTN, h/o non small cell lung cancer with partial left lung resection, HTN  who is being seen today for the evaluation of afib at the request of the ED.  History of Present Illness:   Ms. Knape was recently seen in afib on 05/24/2023, where she was noted to be in recurrent afib, and she was set up for DCCV. DCCV was performed on 05/26/2023.  Over the past week her cardiology team has kindly been increasing her furosemide.  She reports doing generally very well since the cardioversion.  On Monday she drove and got her nails done, and on Tuesday 4/8 she drove and got her haircut and hair done.  When she got home from her haircut, she started experiencing chest pain versus indigestion.  She had no associated nausea, vomiting, or radiation.  She did have some associated dyspnea on exertion, but reports this has been going on for a few weeks.  She has not felt palpitations.  She generally felt bad, but was unable to go to bed, so she sat herself up in the chair.  Eventually around 10 PM she called her children, who instructed her to call 911. With EMS was in afib with RVR to the 140s-150s, and she was given 324mg  asa. She was given oral metoprolol with improvement in her Hrs to the 110s-120s. She was also given 40mg  of IV furosemide.  At baseline, she lives by herself and does all her own ADLs including cooking,cleaning, and medication management.  She is not self reportedly not active, primarily sedentary and sits in her chair.  When asked about whether she has been on any antiarrhythmics for, she states "I do  not want to be on amiodarone" and gives the reason that her late husband "got very sick due to amiodarone".  She thinks he might of had some liver dysfunction, but is not sure.  Past Medical History:  Diagnosis Date   Chronic diastolic CHF (congestive heart failure) (HCC)    Hypertension    Leg edema    Non-small cell lung cancer (NSCLC) (HCC) 2006   s/p surgery, no chemo/XRT needed   Paroxysmal A-fib [facility])     Past Surgical History:  Procedure Laterality Date   CARDIOVERSION N/A 11/06/2021   Procedure: CARDIOVERSION;  Surgeon: Christell Constant, MD;  Location: [facility] ENDOSCOPY;  Service: Cardiovascular;  Laterality: N/A;   CARDIOVERSION N/A 05/26/2023   Procedure: CARDIOVERSION;  Surgeon: Chrystie Nose, MD;  Location: [facility] INVASIVE CV LAB;  Service: Cardiovascular;  Laterality: N/A;   HIP SURGERY     KNEE SURGERY     LUNG REMOVAL, PARTIAL     Left lower lobe removed    TEE WITHOUT CARDIOVERSION N/A 11/06/2021   Procedure: TRANSESOPHAGEAL ECHOCARDIOGRAM (TEE);  Surgeon: Christell Constant, MD;  Location: [facility] ENDOSCOPY;  Service: Cardiovascular;  Laterality: N/A;     Home Medications:  Prior to Admission medications   Medication Sig Start Date End Date Taking? Authorizing Provider  acetaminophen (TYLENOL) 500 MG tablet Take 500 mg by mouth 2 (two) times daily.    [provider]  apixaban (ELIQUIS) 5 MG  TABS tablet Take 1 tablet (5 mg total) by mouth 2 (two) times daily. 05/21/23   Quintella Reichert, MD  calcium carbonate (OS-CAL) 600 MG TABS Take 600 mg by mouth every morning. With Vitamin D-    [provider]  cholecalciferol (VITAMIN D) 1000 UNITS tablet Take 1,000 Units by mouth daily.    [provider]  ferrous sulfate 324 (65 Fe) MG TBEC Take 324 mg by mouth every morning.    [provider]  furosemide (LASIX) 20 MG tablet Take 1 tablet (20 mg total) by mouth daily. May take extra tablet daily for weight gain/swelling Patient taking  differently: Take 40 mg by mouth 2 (two) times daily. May take extra tablet daily for weight gain/swelling 05/24/23   Eustace Pen, PA-C  meclizine (ANTIVERT) 25 MG tablet Take 25 mg by mouth 2 (two) times daily as needed for dizziness. 01/18/23   [provider]  metoprolol tartrate (LOPRESSOR) 50 MG tablet Take 1.5 tablets (75 mg total) by mouth 2 (two) times daily. 05/20/23   Quintella Reichert, MD  Multiple Vitamin (MULTIVITAMIN WITH MINERALS) TABS Take 1 tablet by mouth daily.    [provider]  Propylene Glycol, PF, (SYSTANE COMPLETE PF) 0.6 % SOLN Place 1 drop into both eyes daily as needed (dry eyes).    [provider]  psyllium (HYDROCIL/METAMUCIL) 95 % PACK Take 1 packet by mouth daily.    [provider]  RABEprazole (ACIPHEX) 20 MG tablet Take 20 mg by mouth daily.    [provider]  tretinoin (RETIN-A) 0.05 % cream Apply 1 application  topically daily as needed (Face).    [provider]  UNABLE TO FIND Lift Chair 11/05/21   Zannie Cove, MD  vitamin C (ASCORBIC ACID) 500 MG tablet Take 500 mg by mouth daily.    [provider]    Inpatient Medications: Scheduled Meds:  furosemide  40 mg Intravenous BID   Continuous Infusions:  PRN Meds: acetaminophen **OR** acetaminophen, melatonin, metoprolol tartrate, ondansetron (ZOFRAN) IV  Allergies:    Allergies  Allergen Reactions   Codeine Nausea And Vomiting   Vicodin [Hydrocodone-Acetaminophen] Other (See Comments)    Doesn't like how it makes her feel    Social History:   Social History   Socioeconomic History   Marital status: Widowed    Spouse name: Not on file   Number of children: 2   Years of education: Not on file   Highest education level: High school graduate  Occupational History   Occupation: retired  Tobacco Use   Smoking status: Never   Smokeless tobacco: Never   Tobacco comments:    Never smoke 11/15/21  Vaping Use   Vaping status:  Never Used  Substance and Sexual Activity   Alcohol use: No   Drug use: Never   Sexual activity: Not on file  Other Topics Concern   Not on file  Social History Narrative   Not on file   Social Drivers of Health   Financial Resource Strain: Low Risk  (11/06/2021)   Overall Financial Resource Strain (CARDIA)    Difficulty of Paying Living Expenses: Not hard at all  Food Insecurity: No Food Insecurity (11/05/2021)   Hunger Vital Sign    Worried About Running Out of Food in the Last Year: Never true    Ran Out of Food in the Last Year: Never true  Transportation Needs: No Transportation Needs (11/06/2021)   PRAPARE - Transportation  Lack of Transportation (Medical): No    Lack of Transportation (Non-Medical): No  Physical Activity: Not on file  Stress: Not on file  Social Connections: Not on file  Intimate Partner Violence: Not At Risk (11/05/2021)   Humiliation, Afraid, Rape, and Kick questionnaire    Fear of Current or Ex-Partner: No    Emotionally Abused: No    Physically Abused: No    Sexually Abused: No    Family History:   Family History  Problem Relation Age of Onset   Cataracts Mother    Cataracts Father    Cataracts Sister    Cataracts Brother    Glaucoma Brother      ROS:  Review of Systems: [y] = yes, [ ]  = no      General: Weight gain [ ] ; Weight loss [ ] ; Anorexia [ ] ; Fatigue [ y]; Fever [ ] ; Chills [ ] ; Weakness [ ]    Cardiac: Chest pain/pressure Cove.Etienne ]; Resting SOB [ ] ; Exertional SOB [ y]; Orthopnea [ ] ; Pedal Edema [ y]; Palpitations [ ] ; Syncope [ ] ; Presyncope [ ] ; Paroxysmal nocturnal dyspnea [ ]    Pulmonary: Cough [ ] ; Wheezing [ ] ; Hemoptysis [ ] ; Sputum [ ] ; Snoring [ ]    GI: Vomiting [ ] ; Dysphagia [ ] ; Melena [ ] ; Hematochezia [ ] ; Heartburn [ ] ; Abdominal pain [ ] ; Constipation [ ] ; Diarrhea [ ] ; BRBPR [ ]    GU: Hematuria [ ] ; Dysuria [ ] ; Nocturia [ ]  Vascular: Pain in legs with walking [ ] ; Pain in feet with lying flat [ ] ; Non-healing sores [  ]; Stroke [ ] ; TIA [ ] ; Slurred speech [ ] ;   Neuro: Headaches [ ] ; Vertigo [ ] ; Seizures [ ] ; Paresthesias [ ] ;Blurred vision [ ] ; Diplopia [ ] ; Vision changes [ ]    Ortho/Skin: Arthritis [ ] ; Joint pain [ ] ; Muscle pain [ ] ; Joint swelling [ ] ; Back Pain [ ] ; Rash [ ]    Psych: Depression [ ] ; Anxiety [ ]    Heme: Bleeding problems [ ] ; Clotting disorders [ ] ; Anemia [ ]    Endocrine: Diabetes [ ] ; Thyroid dysfunction [ ]    Physical Exam/Data:   Vitals:   06/20/2023 0245 06/17/2023 0300 06/07/2023 0315 06/01/2023 0330  BP: (!) 112/57 120/81 117/83 (!) 113/95  Pulse: (!) 118 (!) 113 (!) 128 (!) 111  Resp: (!) 29 (!) 29 (!) 31 (!) 28  Temp:      TempSrc:      SpO2: 95% 96% 95% 96%  Weight:       No intake or output data in the 24 hours ending 06/22/2023 0504    06/10/2023    2:13 AM 05/24/2023    2:20 PM 05/20/2023    1:16 PM  Last 3 Weights  Weight (lbs) 187 lb 6.3 oz 184 lb 12.8 oz 183 lb  Weight (kg) 85 kg 83.825 kg 83.008 kg     Body mass index is 31.18 kg/m.  General:  Well nourished, well developed, in no acute distress, frail HEENT: normal Lymph: no adenopathy Neck: no JVD Endocrine:  No thryomegaly Vascular: No carotid bruits; FA pulses 2+ bilaterally without bruits  Cardiac:  normal S1, S2; tachycardic and irregularly irregular; no murmur  Lungs:  clear to auscultation bilaterally, no wheezing, rhonchi or rales  Abd: soft, nontender, no hepatomegaly  Ext: no edema Musculoskeletal:  No deformities, BUE and BLE strength normal and equal Skin: warm and dry  Neuro:  CNs 2-12 intact, no focal  abnormalities noted Psych:  Normal affect   EKG:  The EKG was personally reviewed and demonstrates: A-fib with rapid ventricular response with rates in the 120s, normal QRS duration Telemetry:  Telemetry was personally reviewed and demonstrates: A-fib with RVR  Relevant CV Studies: 1. Left ventricular ejection fraction, by estimation, is 50 to 55%. The  left ventricle has low normal  function. The left ventricle has no regional  wall motion abnormalities. Left ventricular diastolic function could not  be evaluated.   2. Right ventricular systolic function is normal. The right ventricular  size is mildly enlarged. There is normal pulmonary artery systolic  pressure. The estimated right ventricular systolic pressure is 29.9 mmHg.   3. Left atrial size was moderately dilated.   4. Right atrial size was severely dilated.   5. The mitral valve is grossly normal. Moderate mitral valve  regurgitation. No evidence of mitral stenosis.   6. Tricuspid valve regurgitation is mild to moderate.   7. The aortic valve is tricuspid. There is moderate calcification of the  aortic valve. There is moderate thickening of the aortic valve. Aortic  valve regurgitation is not visualized. Aortic valve  sclerosis/calcification is present, without any evidence  of aortic stenosis.   8. The inferior vena cava is normal in size with <50% respiratory  variability, suggesting right atrial pressure of 8 mmHg.   LEFT ATRIUM             Index        RIGHT ATRIUM           Index  LA diam:        4.40 cm 2.18 cm/m   RA Area:     33.10 cm  LA Vol (A2C):   99.8 ml 49.38 ml/m  RA Volume:   115.00 ml 56.90 ml/m  LA Vol (A4C):   90.5 ml 44.78 ml/m  LA Biplane Vol: 99.0 ml 48.98 ml/m   Laboratory Data:  High Sensitivity Troponin:   Recent Labs  Lab 05/30/2023 0206  TROPONINIHS 17     Chemistry Recent Labs  Lab 05/28/23 1424 05/31/23 0959 06/06/2023 0206  NA 142 141 136  K 4.2 4.5 4.7  CL 97 96 98  CO2 29 25 26   GLUCOSE 124* 181* 142*  BUN 33* 36* 40*  CREATININE 1.57* 1.28* 1.42*  CALCIUM 9.3 10.1 9.6  GFRNONAA  --   --  35*  ANIONGAP  --   --  12    Recent Labs  Lab 06/05/2023 0206  PROT 6.7  ALBUMIN 4.1  AST 44*  ALT 25  ALKPHOS 47  BILITOT 1.3*   Hematology Recent Labs  Lab 06/16/2023 0206  WBC 18.1*  RBC 5.02  HGB 15.7*  HCT 49.4*  MCV 98.4  MCH 31.3  MCHC 31.8   RDW 14.6  PLT 218   BNP Recent Labs  Lab 05/31/23 0959 06/05/2023 0206  BNP  --  778.8*  PROBNP 6,022*  --     DDimer No results for input(s): "DDIMER" in the last 168 hours.  Radiology/Studies:  No results found. {  Assessment and Plan:   Paroxysmal atrial fibrillation with rapid ventricular response Chest Pain Presenting with recurrent A-fib with RVR to 150-160s with associated chest pain.  Of note, she was cardioverted last week on 05/26/2023 for the same.  She is mildly volume overloaded.  Risk factors for her A-fib include obesity, moderate left atrial enlargement on prior echo, and HFpEF.  She would benefit from IV  diuresis this admission until thoroughly dry before cardioversion.  She also may benefit from an antiarrhythmic prior to cardioversion.  She self-noted to me that she strongly opposed to amiodarone use due to her late husband being on this and having adverse reactions, however states that it was absolutely necessary she would consider it.  She is mildly frail and sedentary and thus may be at higher risk candidate for ablation, but this could also be considered.  I did not see any records of a prior stress test, but prior chest CT from 2016 shows some degree of mild coronary artery calcifications along with aortic atherosclerosis.  Pending further workup, she may be a candidate for either flecainide or dofetilide as antiarrhythmics prior to cardioversion - Will discuss with electrophysiology. - Agree with second dose of IV metoprolol tartrate 5 mg in ED now, can give additional 5 mg dose after that for goal heart rate less than 100 bpm - Start metoprolol succinate 75 BID, can further uptitrate to 100 mg twice daily - Permanently stop home metoprolol to tartrate for better long-term rate control coverage (half-life of metoprolol titrate his approximately 6 hours) - Once driving prior to discharge, if in A-fib recommend repeat cardioversion.  Has not missed any doses of  anticoagulation - Continue home apixaban 5 mg twice daily - We will discuss potential antiarrythmics with EP today  HFpEF Mildly volume overloaded on examination. S/p diuresis in ED. -Contine spot dosing of IV furosemide  Coronary artery calcification seen on CT imaging Aortic atherosclerosis -Start rosuvastatin 10mg  daily -No asa (on apixaban)  Hypertension CKD -Continue metoprolol as above       For questions or updates, please contact [facility] Please consult [email or web address] for contact info under     Signed, Freddy Finner, MD  06/11/2023 5:04 AM

## 2023-06-24 NOTE — ED Notes (Signed)
Pt given a fan per request.

## 2023-06-24 NOTE — Progress Notes (Signed)
  Echocardiogram 2D Echocardiogram has been performed.  Leda Roys RDCS 06/08/2023, 3:08 PM

## 2023-06-24 NOTE — Progress Notes (Signed)
 2106: CCMD called to notify this RN that HR dropped to 35. Upon walking in the room, RN Greggory Stallion and RN Marisue Ivan were in the room. Pt was unresponsive, no palpable pulse or audible pulse were able to be obtained. No spontaneous reparation noticed, code blue initiated, and CPR is started. Code team arrived. ROSC was obtained 2136. Patient intubated, and Levophed started. Patient transferred to ICU 41M, and report was given to [employer]. Family notified.

## 2023-06-24 NOTE — Anesthesia Procedure Notes (Signed)
 Procedure Name: Intubation Date/Time: 05/29/2023 9:18 PM  Performed by: Portia Wisdom T, CRNAPre-anesthesia Checklist: Patient identified, Emergency Drugs available, Suction available and Patient being monitored Patient Re-evaluated:Patient Re-evaluated prior to induction Oxygen Delivery Method: Ambu bag Preoxygenation: Pre-oxygenation with 100% oxygen Induction Type: IV induction Ventilation: Mask ventilation without difficulty Laryngoscope Size: Mac and 4 Grade View: Grade II Tube type: Oral Tube size: 7.5 mm Number of attempts: 1 Airway Equipment and Method: Stylet Placement Confirmation: ETT inserted through vocal cords under direct vision, breath sounds checked- equal and bilateral and CO2 detector Secured at: 22 cm Tube secured with: Tape Dental Injury: Teeth and Oropharynx as per pre-operative assessment

## 2023-06-24 NOTE — H&P (Addendum)
 History and Physical    Patient: Alison Dunn:[id number] DOB: April 04, 1931 DOA: 06/07/2023 DOS: the patient was seen and examined on 06/16/2023 PCP: Thana Ates, MD  Patient coming from: Home-lives alone  Medical readiness/disposition: Hopefully will be ready for discharge by 06/04/2023.  Will return home.  Chief Complaint:  Chief Complaint  Patient presents with   Chest Pain   HPI: Alison Dunn is a 89 y.o. female with medical history significant of NSCLC Stage IB (T2a, N0, M0), atrial fibrillation on Eliquis with recent cardioversion on 05/26/2023, HFpEF, obesity with a BMI of 31, hypertension, chronic venous insufficiency with associated chronic lower extremity edema.  Was brought to the ED by EMS with reports of chest pain that began at 2200 on 4/8.  She arrived to the ED just after 2 AM.  She was found to be in A-fib with RVR.  She was given 324 mg of aspirin by EMS.  The time she arrived to the ED her chest pain had resolved.  She was evaluated by the cardiac fellow who increased her home metoprolol dose from 50 mg to 75 mg twice daily and initiated metoprolol 5 mg IV every 4 hours to treat tachycardia.  Patient previously expressed to the fellow that she did not want to take amiodarone due to side effects.  Chest x-ray did reveal moderate right pleural effusion with decreased aeration in the right lung base.  Per OP cardiology  notes they had been increasing her Lasix dosage recently.  Given her peripheral edema and CXR findings EDP administered Lasix 40 mg IV x 1.  Her labs were otherwise unremarkable except for an elevated BNP of 778 and a leukocytosis of 18,100 with a normal differential.  Bill service has been asked to evaluate the patient for admission.  In my discussion with the patient she has no awareness of when her heart is racing.  She reports chronic lower extremity edema for multiple years.  Review of systems is otherwise negative.  She also reports chronic essential benign  tremor.   Review of Systems: As mentioned in the history of present illness. All other systems reviewed and are negative. Past Medical History:  Diagnosis Date   Chronic diastolic CHF (congestive heart failure) (HCC)    Hypertension    Leg edema    Non-small cell lung cancer (NSCLC) (HCC) 2006   s/p surgery, no chemo/XRT needed   Paroxysmal A-fib [facility])    Past Surgical History:  Procedure Laterality Date   CARDIOVERSION N/A 11/06/2021   Procedure: CARDIOVERSION;  Surgeon: Christell Constant, MD;  Location: [facility] ENDOSCOPY;  Service: Cardiovascular;  Laterality: N/A;   CARDIOVERSION N/A 05/26/2023   Procedure: CARDIOVERSION;  Surgeon: Chrystie Nose, MD;  Location: [facility] INVASIVE CV LAB;  Service: Cardiovascular;  Laterality: N/A;   HIP SURGERY     KNEE SURGERY     LUNG REMOVAL, PARTIAL     Left lower lobe removed    TEE WITHOUT CARDIOVERSION N/A 11/06/2021   Procedure: TRANSESOPHAGEAL ECHOCARDIOGRAM (TEE);  Surgeon: Christell Constant, MD;  Location: [facility] ENDOSCOPY;  Service: Cardiovascular;  Laterality: N/A;   Social History:  reports that she has never smoked. She has never used smokeless tobacco. She reports that she does not drink alcohol and does not use drugs.  Allergies  Allergen Reactions   Codeine Nausea And Vomiting   Vicodin [Hydrocodone-Acetaminophen] Other (See Comments)    Doesn't like how it makes her feel    Family History  Problem Relation  Age of Onset   Cataracts Mother    Cataracts Father    Cataracts Sister    Cataracts Brother    Glaucoma Brother     Prior to Admission medications   Medication Sig Start Date End Date Taking? Authorizing Provider  acetaminophen (TYLENOL) 500 MG tablet Take 1,000 mg by mouth 2 (two) times daily.   Yes [provider]  apixaban (ELIQUIS) 5 MG TABS tablet Take 1 tablet (5 mg total) by mouth 2 (two) times daily. 05/21/23  Yes Turner, Cornelious Bryant, MD  calcium carbonate (OS-CAL) 600 MG TABS Take 600 mg by mouth every  morning. With Vitamin D-   Yes [provider]  Carboxymethylcellul-Glycerin (CLEAR EYES FOR DRY EYES OP) Place 1 drop into both eyes daily.   Yes [provider]  cholecalciferol (VITAMIN D) 1000 UNITS tablet Take 1,000 Units by mouth daily.   Yes [provider]  ferrous sulfate 324 (65 Fe) MG TBEC Take 324 mg by mouth every morning.   Yes [provider]  furosemide (LASIX) 20 MG tablet Take 1 tablet (20 mg total) by mouth daily. May take extra tablet daily for weight gain/swelling 05/24/23  Yes Eustace Pen, PA-C  meclizine (ANTIVERT) 25 MG tablet Take 25 mg by mouth 2 (two) times daily as needed for dizziness. 01/18/23  Yes [provider]  metoprolol tartrate (LOPRESSOR) 50 MG tablet Take 1.5 tablets (75 mg total) by mouth 2 (two) times daily. 05/20/23  Yes Turner, Cornelious Bryant, MD  Multiple Vitamin (MULTIVITAMIN WITH MINERALS) TABS Take 1 tablet by mouth daily.   Yes [provider]  psyllium (HYDROCIL/METAMUCIL) 95 % PACK Take 1 packet by mouth every evening.   Yes [provider]  RABEprazole (ACIPHEX) 20 MG tablet Take 20 mg by mouth daily.   Yes [provider]  tretinoin (RETIN-A) 0.05 % cream Apply 1 application  topically daily as needed (Face).   Yes [provider]  vitamin C (ASCORBIC ACID) 500 MG tablet Take 500 mg by mouth daily.   Yes [provider]    Physical Exam: Vitals:   05/26/2023 0600 06/22/2023 0615 06/14/2023 0700 05/25/2023 0730  BP: 119/77 104/78 (!) 134/107 116/89  Pulse: (!) 116 (!) 111 (!) 49 (!) 59  Resp: (!) 30 (!) 33 (!) 34 (!) 36  Temp:      TempSrc:      SpO2: 96% 95% 96% 94%  Weight:       Constitutional: NAD, calm, comfortable Respiratory: Sounds coarse to auscultation on posterior exam and decreased in the bases especially on the right.  Pulse oximetry within normal limits. Normal respiratory effort. No accessory muscle use.  I have applied oxygen at 2  L/min. Cardiovascular: Atrial fibrillation with ventricular rates anywhere from 110 to 140 bpm, no murmurs / rubs / gallops.  Heart bilateral lower extremity edema 2+.  2+ pedal pulses. .  Abdomen: no tenderness, no masses palpated.. Bowel sounds positive.  Musculoskeletal: no clubbing / cyanosis. No joint deformity upper and lower extremities. Good ROM, no contractures. Normal muscle tone.  Skin: no rashes, lesions, ulcers. No induration Neurologic: CN 2-12 grossly intact. Sensation intact, Strength 4/5 x all 4 extremities.  Notable for head and bilateral upper extremity tremors consistent with patient's reported history of benign essential tremor. Psychiatric: Normal judgment and insight. Alert and oriented x 3. Normal mood.    Data Reviewed:  As per HPI  Assessment and Plan: Recurrent atrial fibrillation with RVR Status post DCCV  on 4/2 but unfortunately has had reemergence of RVR Did not wish to take amiodarone due to side effects but EP team discussed with her regarding her concerns and she opted to try the amiodarone (please see EP note for details).  Likely DCCV Friday 4/11 Continue Lopressor 75 mg twice daily along with IV Lopressor 5 mg every 4 hours as needed heart rate greater than 100 Continue telemetry Continue home Eliquis Nasal cannula O2 while tachycardic noting pulse oximetry has been within normal ranges  HFpEF Likely secondary to recurrent tachycardia from atrial fibrillation/RVR Given IV Lasix 40 mg x 1 in the ED Cardiology has started Lasix 40 mg IV twice daily Obtain echocardiogram once heart rate less than 100 consistently  Moderate right pleural effusion Consider CT scan to better characterize Unclear if would be a candidate for thoracentesis  Chronic bilateral lower extremity edema Continue to monitor Receiving Lasix for mild exacerbation of HFpEF  NSCLC stage IB (T2, N0, M0) Status post left upper lobectomy in 2006 Last evaluated by oncologist in 2016  with recent scans demonstrating no evidence of disease recurrence  Dyslipidemia Crestor  Benign essential tremor Patient does not report issues with this interfering with her daily life On cardiac beta-blocker  Obesity Last BMI 31 Dietary follow-up and recommendations per PCP    Advance Care Planning:   Code Status: Full Code   VTE prophylaxis: Eliquis  Consults: Cardiology, EP  Family Communication: Patient only  Severity of Illness: The appropriate patient status for this patient is INPATIENT. Inpatient status is judged to be reasonable and necessary in order to provide the required intensity of service to ensure the patient's safety. The patient's presenting symptoms, physical exam findings, and initial radiographic and laboratory data in the context of their chronic comorbidities is felt to place them at high risk for further clinical deterioration. Furthermore, it is not anticipated that the patient will be medically stable for discharge from the hospital within 2 midnights of admission.   * I certify that at the point of admission it is my clinical judgment that the patient will require inpatient hospital care spanning beyond 2 midnights from the point of admission due to high intensity of service, high risk for further deterioration and high frequency of surveillance required.*  Author: Junious Silk, NP 06/13/2023 7:40 AM  For on call review [email or web address].

## 2023-06-24 NOTE — ED Provider Notes (Signed)
  EMERGENCY DEPARTMENT AT [facility] Provider Note   CSN: [id number] Arrival date & time: 06/04/2023  0210     History  Chief Complaint  Patient presents with   Chest Pain    Alison Dunn is a 89 y.o. female.  89 yo F with a chief complaints of chest discomfort.  This started about 10 PM last night.  She said it felt different than anything she had had before.  She thought maybe it was due to indigestion she had just had some watermelon and so she took some Aciphex.  Feeling that did not help and so ended up calling her family members and they called 911.  She has chronic leg swelling that she does not think is changed.  She denies cough congestion or fever denies nausea vomiting or diarrhea.  She had blood work done multiple times this week.  She just been cardioverted this past week.   Chest Pain      Home Medications Prior to Admission medications   Medication Sig Start Date End Date Taking? Authorizing Provider  acetaminophen (TYLENOL) 500 MG tablet Take 500 mg by mouth 2 (two) times daily.    [provider]  apixaban (ELIQUIS) 5 MG TABS tablet Take 1 tablet (5 mg total) by mouth 2 (two) times daily. 05/21/23   Quintella Reichert, MD  calcium carbonate (OS-CAL) 600 MG TABS Take 600 mg by mouth every morning. With Vitamin D-    [provider]  cholecalciferol (VITAMIN D) 1000 UNITS tablet Take 1,000 Units by mouth daily.    [provider]  ferrous sulfate 324 (65 Fe) MG TBEC Take 324 mg by mouth every morning.    [provider]  furosemide (LASIX) 20 MG tablet Take 1 tablet (20 mg total) by mouth daily. May take extra tablet daily for weight gain/swelling Patient taking differently: Take 40 mg by mouth 2 (two) times daily. May take extra tablet daily for weight gain/swelling 05/24/23   Eustace Pen, PA-C  meclizine (ANTIVERT) 25 MG tablet Take 25 mg by mouth 2 (two) times daily as needed for dizziness. 01/18/23   [provider]  metoprolol tartrate (LOPRESSOR) 50 MG tablet Take 1.5 tablets (75 mg total) by mouth 2 (two) times daily. 05/20/23   Quintella Reichert, MD  Multiple Vitamin (MULTIVITAMIN WITH MINERALS) TABS Take 1 tablet by mouth daily.    [provider]  psyllium (HYDROCIL/METAMUCIL) 95 % PACK Take 1 packet by mouth daily.    [provider]  RABEprazole (ACIPHEX) 20 MG tablet Take 20 mg by mouth daily.    [provider]  tretinoin (RETIN-A) 0.05 % cream Apply 1 application  topically daily as needed (Face).    [provider]  vitamin C (ASCORBIC ACID) 500 MG tablet Take 500 mg by mouth daily.    [provider]      Allergies    Codeine and Vicodin [hydrocodone-acetaminophen]    Review of Systems   Review of Systems  Cardiovascular:  Positive for chest pain.    Physical Exam Updated Vital Signs BP 104/78   Pulse (!) 111   Temp 98.1 F (36.7 C) (Oral)   Resp (!) 33   Wt 85 kg   SpO2 95%   BMI 31.18 kg/m  Physical Exam Vitals and nursing note reviewed.  Constitutional:      General: She is not in acute distress.    Appearance: She is well-developed. She is not  diaphoretic.  HENT:     Head: Normocephalic and atraumatic.  Eyes:     Pupils: Pupils are equal, round, and reactive to light.  Cardiovascular:     Rate and Rhythm: Tachycardia present. Rhythm irregular.     Heart sounds: No murmur heard.    No friction rub. No gallop.  Pulmonary:     Effort: Pulmonary effort is normal.     Breath sounds: No wheezing or rales.  Abdominal:     General: There is no distension.     Palpations: Abdomen is soft.     Tenderness: There is no abdominal tenderness.  Musculoskeletal:        General: No tenderness.     Cervical back: Normal range of motion and neck supple.     Right lower leg: Edema present.     Left lower leg: Edema present.     Comments: Left lower extremity larger than right lower extremity.  Chronic per patient.  3-4+  lower extremity edema bilaterally.  Lesion to the left lateral aspect of the left lower leg.  Skin:    General: Skin is warm and dry.  Neurological:     Mental Status: She is alert and oriented to person, place, and time.  Psychiatric:        Behavior: Behavior normal.     ED Results / Procedures / Treatments   Labs (all labs ordered are listed, but only abnormal results are displayed) Labs Reviewed  CBC WITH DIFFERENTIAL/PLATELET - Abnormal; Notable for the following components:      Result Value   WBC 18.1 (*)    Hemoglobin 15.7 (*)    HCT 49.4 (*)    Neutro Abs 14.3 (*)    Monocytes Absolute 1.3 (*)    Abs Immature Granulocytes 0.09 (*)    All other components within normal limits  COMPREHENSIVE METABOLIC PANEL WITH GFR - Abnormal; Notable for the following components:   Glucose, Bld 142 (*)    BUN 40 (*)    Creatinine, Ser 1.42 (*)    AST 44 (*)    Total Bilirubin 1.3 (*)    GFR, Estimated 35 (*)    All other components within normal limits  BRAIN NATRIURETIC PEPTIDE - Abnormal; Notable for the following components:   B Natriuretic Peptide 778.8 (*)    All other components within normal limits  LIPASE, BLOOD  MAGNESIUM  TROPONIN I (HIGH SENSITIVITY)    EKG None  Radiology DG Chest Port 1 View Result Date: 05/25/2023 CLINICAL DATA:  Chest pain and shortness of breath. History of lung cancer with partial left lung resection. EXAM: PORTABLE CHEST 1 VIEW COMPARISON:  11/04/21 FINDINGS: Stable cardiomediastinal contours. Surgical clips identified within the left hilar region status post left upper lobectomy. There is compensatory hyperinflation of the left lower lobe. Moderate right pleural effusion is noted with decreased aeration to the right lung base. Visualized osseous structures are unremarkable. IMPRESSION: 1. Moderate right pleural effusion with decreased aeration to the right lung base. 2. Status post left upper lobectomy. Electronically Signed   By: Signa Kell  M.D.   On: 06/01/2023 05:53    Procedures .Critical Care  Performed by: Melene Plan, DO Authorized by: Melene Plan, DO   Critical care provider statement:    Critical care time (minutes):  35   Critical care time was exclusive of:  Separately billable procedures and treating other patients   Critical care was time spent personally by me on the following activities:  Development of treatment plan with patient or surrogate, discussions with consultants, evaluation of patient's response to treatment, examination of patient, ordering and review of laboratory studies, ordering and review of radiographic studies, ordering and performing treatments and interventions, pulse oximetry, re-evaluation of patient's condition and review of old charts   Care discussed with: admitting provider       Medications Ordered in ED Medications  acetaminophen (TYLENOL) tablet 650 mg (has no administration in time range)    Or  acetaminophen (TYLENOL) suppository 650 mg (has no administration in time range)  melatonin tablet 3 mg (has no administration in time range)  ondansetron (ZOFRAN) injection 4 mg (has no administration in time range)  furosemide (LASIX) injection 40 mg (has no administration in time range)  metoprolol tartrate (LOPRESSOR) injection 5 mg (has no administration in time range)  metoprolol succinate (TOPROL-XL) 24 hr tablet 75 mg (has no administration in time range)  rosuvastatin (CRESTOR) tablet 10 mg (has no administration in time range)  metoprolol tartrate (LOPRESSOR) injection 5 mg (5 mg Intravenous Given 06/14/2023 0249)  metoprolol tartrate (LOPRESSOR) tablet 50 mg (50 mg Oral Given 06/18/2023 0248)  alum & mag hydroxide-simeth (MAALOX/MYLANTA) 200-200-20 MG/5ML suspension 30 mL (30 mLs Oral Given 05/31/2023 0254)  furosemide (LASIX) injection 40 mg (40 mg Intravenous Given 06/01/2023 0457)  metoprolol tartrate (LOPRESSOR) injection 5 mg (5 mg Intravenous Given 06/21/2023 0457)    ED Course/ Medical  Decision Making/ A&P                                 Medical Decision Making Amount and/or Complexity of Data Reviewed Labs: ordered. Radiology: ordered.  Risk OTC drugs. Prescription drug management. Decision regarding hospitalization.   89 yo F with a cc of chest pain.  Started after having some watermelon.  Patient in A-fib with RVR.  She just cardioverted 7 days ago on my record review.  The cardiology note prior that said that she had permanent A-fib.  I am not sure if she is symptomatic from this or not.  Will attempt rate control here.  Check lab work chest x-ray reassess.  Leukocytosis.  No significant electrolyte abnormality.  Troponin negative.  BNP elevated above baseline at 770.  Chest x-ray independently interpreted by me with a right-sided pleural effusion.  More pronounced than last check Nov 23, 2021.  Patient's family has arrived.  They provide some further history.  States that in 01-Oct-2023she had to come to the hospital for fluid overload and has been 4 days here with diuretic therapy.  At that time she was also in atrial fibrillation.  Patient's rates have improved slightly with bolus dose of metoprolol and oral dose.  Will discuss with medicine for admission.  I discussed the case with the cardiology fellow, Dr. Hulan Saas, will come see the patient at bedside.  The patients results and plan were reviewed and discussed.   Any x-rays performed were independently reviewed by myself.   Differential diagnosis were considered with the presenting HPI.  Medications  acetaminophen (TYLENOL) tablet 650 mg (has no administration in time range)    Or  acetaminophen (TYLENOL) suppository 650 mg (has no administration in time range)  melatonin tablet 3 mg (has no administration in time range)  ondansetron (ZOFRAN) injection 4 mg (has no administration in time range)  furosemide (LASIX) injection 40 mg (has no administration in time range)  metoprolol tartrate  (LOPRESSOR) injection 5 mg (  has no administration in time range)  metoprolol succinate (TOPROL-XL) 24 hr tablet 75 mg (has no administration in time range)  rosuvastatin (CRESTOR) tablet 10 mg (has no administration in time range)  metoprolol tartrate (LOPRESSOR) injection 5 mg (5 mg Intravenous Given 06/15/2023 0249)  metoprolol tartrate (LOPRESSOR) tablet 50 mg (50 mg Oral Given 05/25/2023 0248)  alum & mag hydroxide-simeth (MAALOX/MYLANTA) 200-200-20 MG/5ML suspension 30 mL (30 mLs Oral Given 05/31/2023 0254)  furosemide (LASIX) injection 40 mg (40 mg Intravenous Given 06/06/2023 0457)  metoprolol tartrate (LOPRESSOR) injection 5 mg (5 mg Intravenous Given 06/23/2023 0457)    Vitals:   06/05/2023 0515 06/10/2023 0530 05/27/2023 0600 05/28/2023 0615  BP: (!) 122/107 (!) 134/101 119/77 104/78  Pulse: (!) 116 (!) 124 (!) 116 (!) 111  Resp: (!) 34 (!) 25 (!) 30 (!) 33  Temp:      TempSrc:      SpO2: 94% 95% 96% 95%  Weight:        Final diagnoses:  Acute exacerbation of chronic heart failure (HCC)  Atrial fibrillation with rapid ventricular response (HCC)    Admission/ observation were discussed with the admitting physician, patient and/or family and they are comfortable with the plan.          Final Clinical Impression(s) / ED Diagnoses Final diagnoses:  Acute exacerbation of chronic heart failure (HCC)  Atrial fibrillation with rapid ventricular response [facility])    Rx / DC Orders ED Discharge Orders     None         Melene Plan, DO 06/21/2023 [phone]

## 2023-06-24 NOTE — Progress Notes (Signed)
 Per d/w EP APP who discussed with Dr. Rennis Golden, start IV amio load (150mg  bolus then usual drip), with plan to continue until DCCV then then switch to PO 200 mg BID x 2 weeks then 200 mg daily, with AF clinic follow up a week or two post DC.   Tentatively put on the DCCV schedule for Friday at Dr. Royann Shivers, will need final orders in rounds tomorrow when consent documented. Did make NPO after MN for that day. OK to eat otherwise until then.

## 2023-06-24 NOTE — Consult Note (Addendum)
 NAME:  Alison Dunn, MRN:  [id number], DOB:  September 19, 1931, LOS: 0 ADMISSION DATE:  06/15/2023, CONSULTATION DATE:  06/17/2023  REFERRING MD:  Hal Morales, CHIEF COMPLAINT:  cardiac arrest   History of Present Illness:  89 year old woman brought in by EMS on 4/8 with chest pain and found to be in atrial fibrillation/RVR.  She was given aspirin by EMS, chest pain resolved after arrival to the ED.  She was treated with IV metoprolol and subsequently IV amiodarone was loaded with plan for eventual DCCV.  She has chronic lower extremity edema and a chronic essential benign tremor. She was noted to be hypotensive and developed cardiac arrest.  Initial rhythm asystole, total downtime 13 minutes epi x 4.  Intubated by anesthesia On my arrival wide-complex rhythm on monitor with ST elevation, given calcium and bicarb empirically to treat hyperkalemia.  Twelve-lead EKG showed wide-complex, RBBB pattern with Q waves inferior leads, discussed with STEMI on call -who felt that this was related to hyperkalemia  Pertinent  Medical History  NSCLC Stage IB (T2a, N0, M0),  atrial fibrillation on Eliquis with recent cardioversion on 05/26/2023,  HFpEF,   hypertension,  chronic venous insufficiency with associated chronic lower extremity edema.   Significant Hospital Events: Including procedures, antibiotic start and stop dates in addition to other pertinent events     Interim History / Subjective:  Critically ill, intubated, transferred to ICU  Objective   Blood pressure (!) 126/103, pulse 87, temperature (!) 95.7 F (35.4 C), temperature source Temporal, resp. rate (!) 30, height 5\' 5"  (1.651 m), weight 85 kg, SpO2 90%.       No intake or output data in the 24 hours ending 05/28/2023 2202 Filed Weights   06/09/2023 2952  Weight: 85 kg    Examination: General: Elderly woman, unresponsive, orally intubated HENT: Mild pallor, no icterus, no JVD Lungs: Bilateral ventilated breath sounds, no  rhonchi Cardiovascular: S1-S2 irregular Abdomen: Soft, nontender no Breztri megaly Extremities: 1+ bipedal edema Neuro: Unresponsive, pupils RT 3 mm  LT 4mm dilated not reactive to light   Labs show normal electrolytes, BUN/creatinine 40/1.4, potassium 4.7, leukocytosis 18K, BNP 6022, troponin negative  Resolved Hospital Problem list     Assessment & Plan:  Cardiac arrest/PEA -likely primary cardiac cause versus hyperkalemia, not felt to be STEMI Atrial fibrillation/RVR, loaded with amiodarone Hypotension postarrest  -Currently rate controlled, DC amiodarone - Cardiology consult, bedside echo - Cycle troponins, check electrolytes - Levophed via PIV - DC metoprolol  Acute respiratory failure -Vent settings reviewed and adjusted Check ABG and chest x-ray  Right pleural effusion -noted on admit and attributed to congestive heart failure. resume Lasix when blood pressure improves  Acute encephalopathy postarrest Unequal pupils - head CT once stable Normothermia protocol Supportive care   Best Practice (right click and "Reselect all SmartList Selections" daily)   Diet/type: NPO DVT prophylaxis DOAC Pressure ulcer(s): N/A GI prophylaxis: PPI Lines: N/A Foley:  N/A Code Status:  full code Last date of multidisciplinary goals of care discussion [updated son and daughter-in-law at bedside, she has living well, she would not want prolonged life support, they request full CODE STATUS as of now]  Labs   CBC: Recent Labs  Lab 06/07/2023 0206  WBC 18.1*  NEUTROABS 14.3*  HGB 15.7*  HCT 49.4*  MCV 98.4  PLT 218    Basic Metabolic Panel: Recent Labs  Lab 05/28/23 1424 05/31/23 0959 06/04/2023 0206  NA 142 141 136  K 4.2 4.5 4.7  CL  97 96 98  CO2 29 25 26   GLUCOSE 124* 181* 142*  BUN 33* 36* 40*  CREATININE 1.57* 1.28* 1.42*  CALCIUM 9.3 10.1 9.6  MG  --   --  1.9   GFR: Estimated Creatinine Clearance: 28.9 mL/min (A) (by C-G formula based on SCr of 1.42 mg/dL  (H)). Recent Labs  Lab 06/23/2023 0206  WBC 18.1*    Liver Function Tests: Recent Labs  Lab 06/04/2023 0206  AST 44*  ALT 25  ALKPHOS 47  BILITOT 1.3*  PROT 6.7  ALBUMIN 4.1   Recent Labs  Lab 05/27/2023 0206  LIPASE 24   No results for input(s): "AMMONIA" in the last 168 hours.  ABG    Component Value Date/Time   TCO2 27 11/07/2009 1028     Coagulation Profile: No results for input(s): "INR", "PROTIME" in the last 168 hours.  Cardiac Enzymes: No results for input(s): "CKTOTAL", "CKMB", "CKMBINDEX", "TROPONINI" in the last 168 hours.  HbA1C: No results found for: "HGBA1C"  CBG: No results for input(s): "GLUCAP" in the last 168 hours.  Review of Systems:   Unable to obtain  Past Medical History:  She,  has a past medical history of Chronic diastolic CHF (congestive heart failure) (HCC), Hypertension, Leg edema, Non-small cell lung cancer (NSCLC) (HCC) (2006), and Paroxysmal A-fib (HCC).   Surgical History:   Past Surgical History:  Procedure Laterality Date   CARDIOVERSION N/A 11/06/2021   Procedure: CARDIOVERSION;  Surgeon: Christell Constant, MD;  Location: [facility] ENDOSCOPY;  Service: Cardiovascular;  Laterality: N/A;   CARDIOVERSION N/A 05/26/2023   Procedure: CARDIOVERSION;  Surgeon: Chrystie Nose, MD;  Location: [facility] INVASIVE CV LAB;  Service: Cardiovascular;  Laterality: N/A;   HIP SURGERY     KNEE SURGERY     LUNG REMOVAL, PARTIAL     Left lower lobe removed    TEE WITHOUT CARDIOVERSION N/A 11/06/2021   Procedure: TRANSESOPHAGEAL ECHOCARDIOGRAM (TEE);  Surgeon: Christell Constant, MD;  Location[facility] ENDOSCOPY;  Service: Cardiovascular;  Laterality: N/A;     Social History:   reports that she has never smoked. She has never used smokeless tobacco. She reports that she does not drink alcohol and does not use drugs.   Family History:  Her family history includes Cataracts in her brother, father, mother, and sister; Glaucoma in her brother.    Allergies Allergies  Allergen Reactions   Codeine Nausea And Vomiting   Vicodin [Hydrocodone-Acetaminophen] Other (See Comments)    Doesn't like how it makes her feel     Home Medications  Prior to Admission medications   Medication Sig Start Date End Date Taking? Authorizing Provider  acetaminophen (TYLENOL) 500 MG tablet Take 1,000 mg by mouth 2 (two) times daily.   Yes [provider]  apixaban (ELIQUIS) 5 MG TABS tablet Take 1 tablet (5 mg total) by mouth 2 (two) times daily. 05/21/23  Yes Turner, Cornelious Bryant, MD  calcium carbonate (OS-CAL) 600 MG TABS Take 600 mg by mouth every morning. With Vitamin D-   Yes [provider]  Carboxymethylcellul-Glycerin (CLEAR EYES FOR DRY EYES OP) Place 1 drop into both eyes daily.   Yes [provider]  cholecalciferol (VITAMIN D) 1000 UNITS tablet Take 1,000 Units by mouth daily.   Yes [provider]  ferrous sulfate 324 (65 Fe) MG TBEC Take 324 mg by mouth every morning.   Yes [provider]  furosemide (LASIX) 20 MG tablet Take 1 tablet (20 mg total) by mouth  daily. May take extra tablet daily for weight gain/swelling 05/24/23  Yes Eustace Pen, PA-C  meclizine (ANTIVERT) 25 MG tablet Take 25 mg by mouth 2 (two) times daily as needed for dizziness. 01/18/23  Yes [provider]  metoprolol tartrate (LOPRESSOR) 50 MG tablet Take 1.5 tablets (75 mg total) by mouth 2 (two) times daily. 05/20/23  Yes Turner, Cornelious Bryant, MD  Multiple Vitamin (MULTIVITAMIN WITH MINERALS) TABS Take 1 tablet by mouth daily.   Yes [provider]  psyllium (HYDROCIL/METAMUCIL) 95 % PACK Take 1 packet by mouth every evening.   Yes [provider]  RABEprazole (ACIPHEX) 20 MG tablet Take 20 mg by mouth daily.   Yes [provider]  tretinoin (RETIN-A) 0.05 % cream Apply 1 application  topically daily as needed (Face).   Yes [provider]  vitamin C (ASCORBIC ACID) 500 MG tablet Take 500  mg by mouth daily.   Yes [provider]     Critical care time: 71 m     Cyril Mourning MD. FCCP. [facility] Pager : [phone]  If no response to pager , please call [phone] until 7 pm After 7:00 pm call Elink  [phone]   06/16/2023

## 2023-06-24 NOTE — ED Notes (Signed)
 Pt continues to use the bedside commode every .  Pt keeps having medium sized brown stools.

## 2023-06-24 NOTE — Progress Notes (Signed)
  Carryover admission to the Day Admitter.  I discussed this case with the EDP, Dr. Adela Lank.  Per these discussions:   This is a 89 year old female with history of paroxysmal atrial fibrillation who just underwent electrocardioversion 1 week ago, history of chronic diastolic heart failure, is being admitted with acute on chronic diastolic heart failure, atrial fibrillation with RVR after presenting with chest pain, shortness of breath starting earlier this evening.  Since that time for electrocardioversion 1 week ago, cardiology is reported to have been gradually increasing her dose of outpatient diuretic.  She is found to once again be in atrial fibrillation with RVR with initial heart rates in the 140s to 150s, subsequently improving into the 110s to 120s following dose of oral Lopressor dose of IV Lopressor as well as 40 of IV Lasix.  She is tolerating these heart rates from a blood pressure standpoint.  Chest x-ray reportedly suggestive of volume overload, including right-sided pleural effusion.  She was reportedly hospitalized in 2023 with atrial fibrillation with RVR as well as volume overload, with family noting that the patient presents right-sided pleural effusion at that time, undergoing IV diuresis over the course of this prior hospitalization.   I have placed an order for inpatient admission to PCU for further evaluation management of the above.   Additionally, I've asked EDP to please request cardiology consult  I have placed some additional preliminary admit orders via the adult multi-morbid admission order set. I have also ordered additional Lasix 40 mg IV twice daily, added on a magnesium level, and added order for prn IV Lopressor for sustained heart rate greater than 130 bpm.    Newton Pigg, DO Hospitalist

## 2023-06-24 NOTE — Progress Notes (Signed)
  EP asked to evaluate pt for AF with prior desire to avoid amiodarone.   Unfortunately, with advanced age, low Cr clearance, HF symptoms, and coronary calcifications on prior CT, she is not a candidate for any other AADs.   Options would be amiodarone vs rate control.  In to discuss with pt whose primary concerns regarding amiodarone were cost and side effects, as her husband was on brand name Cordarone at high doses > 20 years ago.  We discussed changes in monitoring, pricing, and dosing since those times and pt is agreeable to amiodarone.  Would recommend load and cardioversion, with follow up in AF clinic on discharge.  Gen cards made aware.  Please call with further questions  All above reviewed with Dr Jimmey Ralph.  Casimiro Needle [address] [place], [place]

## 2023-06-24 NOTE — ED Triage Notes (Signed)
 Patient BIB [facility] c/o chest pain that started at 2200 last night.  EMS reports patient has history of afib and was in afib RVR.  Patient given 324 mg asa by ems.  Patient caox4, denies pain at this time.

## 2023-06-24 NOTE — Progress Notes (Signed)
 Responded to CODE BLUE alert. Unit RN advised that family has made decision for pt to be DNR. Advised unit RN to consult if needed.

## 2023-06-24 NOTE — Progress Notes (Signed)
 Bedside echo showed diffuse LV hypokinesis 2 A bicarb given VBG showing severe metabolic acidosis Patient had another cardiac arrest>> discussed with family while CPR in progress, she would never want prolonged life support or to be in a nursing home.  They agreed to stop resuscitation efforts. DNR issued, will transition to comfort care  Additional critical care time was 20 minutes  Codie Hainer V. Vassie Loll MD

## 2023-06-24 NOTE — Progress Notes (Signed)
 Pt bagged and transported from 3E12 to 2M09 without any complications. Upon arrival, pt was connected to a ventilator by another RT.

## 2023-06-24 NOTE — Code Documentation (Signed)
  Patient Name: Alison Dunn   MRN: [id number]   Date of Birth/ Sex: 02/03/1932 , female      Admission Date: 06/23/2023  Attending Provider: Lonia Blood, MD  Primary Diagnosis: Acute on chronic diastolic heart failure [facility])   Indication: Pt was in her usual state of health until this PM, when she was noted to be hypotensive and was found to be unresponsive. Code blue was subsequently called. At the time of arrival on scene, ACLS protocol was underway.   Technical Description:  - CPR performance duration:  20 minutes  - Was defibrillation or cardioversion used? No   - Was external pacer placed? No  - Was patient intubated pre/post CPR? Yes   Medications Administered: Y = Yes; Blank = No Amiodarone    Atropine    Calcium  Y  Epinephrine  Y  Lidocaine    Magnesium    Norepinephrine    Phenylephrine    Sodium bicarbonate  Y  Vasopressin     Post CPR evaluation:  - Final Status - Was patient successfully resuscitated ? Yes - What is current rhythm? Sinus? Wide complex, ST changes. Cardiology paged - What is current hemodynamic status? Stable  Miscellaneous Information:  - Labs sent, including: None  - Primary team notified?  Yes  - Family Notified? Yes  - Additional notes/ transfer status: PCCM consulted, transfer to ICU      Lovie Macadamia, MD  06/01/2023, 9:28 PM

## 2023-06-24 DEATH — deceased

## 2023-07-20 MED FILL — Medication: Qty: 1 | Status: AC

## 2023-07-20 MED FILL — Epinephrine Soln Prefilled Syringe 0.1 MG/10ML (10 MCG/ML): INTRAVENOUS | Qty: 10 | Status: AC
